# Patient Record
Sex: Male | Born: 1940 | Race: White | Hispanic: No | Marital: Married | Smoking: Former smoker
Health system: Southern US, Community
[De-identification: ages and names within clinical notes are randomized; demographics above are authoritative.]

## PROBLEM LIST (undated history)

## (undated) DIAGNOSIS — M1990 Unspecified osteoarthritis, unspecified site: Secondary | ICD-10-CM

## (undated) DIAGNOSIS — I10 Essential (primary) hypertension: Secondary | ICD-10-CM

## (undated) DIAGNOSIS — E785 Hyperlipidemia, unspecified: Secondary | ICD-10-CM

## (undated) DIAGNOSIS — I5030 Unspecified diastolic (congestive) heart failure: Secondary | ICD-10-CM

## (undated) DIAGNOSIS — I499 Cardiac arrhythmia, unspecified: Secondary | ICD-10-CM

## (undated) DIAGNOSIS — IMO0002 Reserved for concepts with insufficient information to code with codable children: Secondary | ICD-10-CM

## (undated) DIAGNOSIS — I4891 Unspecified atrial fibrillation: Secondary | ICD-10-CM

## (undated) DIAGNOSIS — M329 Systemic lupus erythematosus, unspecified: Secondary | ICD-10-CM

## (undated) DIAGNOSIS — R7302 Impaired glucose tolerance (oral): Secondary | ICD-10-CM

## (undated) DIAGNOSIS — Z952 Presence of prosthetic heart valve: Secondary | ICD-10-CM

## (undated) DIAGNOSIS — Z950 Presence of cardiac pacemaker: Secondary | ICD-10-CM

## (undated) HISTORY — DX: Reserved for concepts with insufficient information to code with codable children: IMO0002

## (undated) HISTORY — DX: Systemic lupus erythematosus, unspecified: M32.9

## (undated) HISTORY — PX: COLONOSCOPY: SHX174

## (undated) HISTORY — PX: REPLACEMENT TOTAL KNEE: SUR1224

## (undated) HISTORY — PX: EYE SURGERY: SHX253

## (undated) HISTORY — PX: VASECTOMY: SHX75

---

## 2004-07-20 ENCOUNTER — Ambulatory Visit: Payer: Self-pay | Admitting: Family Medicine

## 2005-05-25 ENCOUNTER — Ambulatory Visit: Payer: Self-pay | Admitting: Family Medicine

## 2012-03-24 HISTORY — PX: PACEMAKER IMPLANT: EP1218

## 2012-05-24 HISTORY — PX: CARDIAC CATHETERIZATION: SHX172

## 2014-11-19 DIAGNOSIS — E785 Hyperlipidemia, unspecified: Secondary | ICD-10-CM | POA: Insufficient documentation

## 2014-11-19 DIAGNOSIS — Z23 Encounter for immunization: Secondary | ICD-10-CM

## 2014-11-19 DIAGNOSIS — D509 Iron deficiency anemia, unspecified: Secondary | ICD-10-CM | POA: Insufficient documentation

## 2014-11-19 DIAGNOSIS — I480 Paroxysmal atrial fibrillation: Secondary | ICD-10-CM

## 2014-11-19 DIAGNOSIS — Z950 Presence of cardiac pacemaker: Secondary | ICD-10-CM | POA: Insufficient documentation

## 2014-11-19 DIAGNOSIS — Z45018 Encounter for adjustment and management of other part of cardiac pacemaker: Secondary | ICD-10-CM | POA: Insufficient documentation

## 2014-11-19 HISTORY — DX: Encounter for immunization: Z23

## 2014-11-19 HISTORY — DX: Paroxysmal atrial fibrillation: I48.0

## 2014-11-19 HISTORY — DX: Presence of cardiac pacemaker: Z95.0

## 2014-11-19 HISTORY — DX: Hyperlipidemia, unspecified: E78.5

## 2015-08-27 DIAGNOSIS — E119 Type 2 diabetes mellitus without complications: Secondary | ICD-10-CM

## 2015-08-27 DIAGNOSIS — I340 Nonrheumatic mitral (valve) insufficiency: Secondary | ICD-10-CM | POA: Insufficient documentation

## 2015-08-27 DIAGNOSIS — I2510 Atherosclerotic heart disease of native coronary artery without angina pectoris: Secondary | ICD-10-CM | POA: Insufficient documentation

## 2015-08-27 DIAGNOSIS — Z8739 Personal history of other diseases of the musculoskeletal system and connective tissue: Secondary | ICD-10-CM

## 2015-08-27 HISTORY — DX: Personal history of other diseases of the musculoskeletal system and connective tissue: Z87.39

## 2015-08-27 HISTORY — DX: Type 2 diabetes mellitus without complications: E11.9

## 2016-02-26 DIAGNOSIS — H903 Sensorineural hearing loss, bilateral: Secondary | ICD-10-CM | POA: Insufficient documentation

## 2016-02-26 DIAGNOSIS — Z8679 Personal history of other diseases of the circulatory system: Secondary | ICD-10-CM | POA: Insufficient documentation

## 2016-02-26 DIAGNOSIS — H9313 Tinnitus, bilateral: Secondary | ICD-10-CM | POA: Insufficient documentation

## 2016-02-26 HISTORY — DX: Personal history of other diseases of the circulatory system: Z86.79

## 2016-07-29 DIAGNOSIS — I442 Atrioventricular block, complete: Secondary | ICD-10-CM | POA: Insufficient documentation

## 2017-03-11 ENCOUNTER — Telehealth: Payer: Self-pay | Admitting: Cardiology

## 2017-03-11 NOTE — Telephone Encounter (Signed)
Did not need this encounter °

## 2017-05-13 DIAGNOSIS — E785 Hyperlipidemia, unspecified: Secondary | ICD-10-CM

## 2017-05-13 DIAGNOSIS — R6510 Systemic inflammatory response syndrome (SIRS) of non-infectious origin without acute organ dysfunction: Secondary | ICD-10-CM | POA: Diagnosis not present

## 2017-05-13 DIAGNOSIS — I10 Essential (primary) hypertension: Secondary | ICD-10-CM | POA: Diagnosis not present

## 2017-05-13 DIAGNOSIS — I4891 Unspecified atrial fibrillation: Secondary | ICD-10-CM | POA: Diagnosis not present

## 2017-05-13 DIAGNOSIS — R748 Abnormal levels of other serum enzymes: Secondary | ICD-10-CM | POA: Diagnosis not present

## 2017-05-13 DIAGNOSIS — I2510 Atherosclerotic heart disease of native coronary artery without angina pectoris: Secondary | ICD-10-CM | POA: Diagnosis not present

## 2017-05-14 ENCOUNTER — Inpatient Hospital Stay (HOSPITAL_COMMUNITY)
Admission: AD | Admit: 2017-05-14 | Discharge: 2017-05-17 | DRG: 871 | Disposition: A | Discharge status: Home / Self Care | Payer: Medicare Other | Source: Other Acute Inpatient Hospital | Attending: Internal Medicine | Admitting: Internal Medicine

## 2017-05-14 ENCOUNTER — Encounter (HOSPITAL_COMMUNITY): Payer: Self-pay | Admitting: Internal Medicine

## 2017-05-14 ENCOUNTER — Other Ambulatory Visit: Payer: Self-pay

## 2017-05-14 DIAGNOSIS — R042 Hemoptysis: Secondary | ICD-10-CM | POA: Diagnosis present

## 2017-05-14 DIAGNOSIS — I10 Essential (primary) hypertension: Secondary | ICD-10-CM | POA: Diagnosis not present

## 2017-05-14 DIAGNOSIS — Z800 Family history of malignant neoplasm of digestive organs: Secondary | ICD-10-CM

## 2017-05-14 DIAGNOSIS — R040 Epistaxis: Secondary | ICD-10-CM | POA: Diagnosis present

## 2017-05-14 DIAGNOSIS — T45515A Adverse effect of anticoagulants, initial encounter: Secondary | ICD-10-CM | POA: Diagnosis present

## 2017-05-14 DIAGNOSIS — Z96652 Presence of left artificial knee joint: Secondary | ICD-10-CM | POA: Diagnosis present

## 2017-05-14 DIAGNOSIS — I480 Paroxysmal atrial fibrillation: Secondary | ICD-10-CM | POA: Diagnosis not present

## 2017-05-14 DIAGNOSIS — R7302 Impaired glucose tolerance (oral): Secondary | ICD-10-CM | POA: Diagnosis present

## 2017-05-14 DIAGNOSIS — A419 Sepsis, unspecified organism: Secondary | ICD-10-CM | POA: Diagnosis not present

## 2017-05-14 DIAGNOSIS — D6832 Hemorrhagic disorder due to extrinsic circulating anticoagulants: Secondary | ICD-10-CM | POA: Diagnosis present

## 2017-05-14 DIAGNOSIS — Z7901 Long term (current) use of anticoagulants: Secondary | ICD-10-CM | POA: Diagnosis not present

## 2017-05-14 DIAGNOSIS — E785 Hyperlipidemia, unspecified: Secondary | ICD-10-CM | POA: Diagnosis present

## 2017-05-14 DIAGNOSIS — Z888 Allergy status to other drugs, medicaments and biological substances status: Secondary | ICD-10-CM | POA: Diagnosis not present

## 2017-05-14 DIAGNOSIS — R55 Syncope and collapse: Secondary | ICD-10-CM | POA: Diagnosis not present

## 2017-05-14 DIAGNOSIS — I248 Other forms of acute ischemic heart disease: Secondary | ICD-10-CM | POA: Diagnosis present

## 2017-05-14 DIAGNOSIS — Z87891 Personal history of nicotine dependence: Secondary | ICD-10-CM

## 2017-05-14 DIAGNOSIS — Z950 Presence of cardiac pacemaker: Secondary | ICD-10-CM

## 2017-05-14 DIAGNOSIS — N401 Enlarged prostate with lower urinary tract symptoms: Secondary | ICD-10-CM | POA: Diagnosis not present

## 2017-05-14 DIAGNOSIS — D696 Thrombocytopenia, unspecified: Secondary | ICD-10-CM | POA: Diagnosis present

## 2017-05-14 DIAGNOSIS — I482 Chronic atrial fibrillation: Secondary | ICD-10-CM | POA: Diagnosis not present

## 2017-05-14 DIAGNOSIS — I110 Hypertensive heart disease with heart failure: Secondary | ICD-10-CM | POA: Diagnosis present

## 2017-05-14 DIAGNOSIS — I2510 Atherosclerotic heart disease of native coronary artery without angina pectoris: Secondary | ICD-10-CM | POA: Diagnosis not present

## 2017-05-14 DIAGNOSIS — E43 Unspecified severe protein-calorie malnutrition: Secondary | ICD-10-CM | POA: Diagnosis not present

## 2017-05-14 DIAGNOSIS — Z6834 Body mass index (BMI) 34.0-34.9, adult: Secondary | ICD-10-CM

## 2017-05-14 DIAGNOSIS — R7989 Other specified abnormal findings of blood chemistry: Secondary | ICD-10-CM

## 2017-05-14 DIAGNOSIS — I350 Nonrheumatic aortic (valve) stenosis: Secondary | ICD-10-CM | POA: Diagnosis present

## 2017-05-14 DIAGNOSIS — R778 Other specified abnormalities of plasma proteins: Secondary | ICD-10-CM | POA: Diagnosis present

## 2017-05-14 DIAGNOSIS — Y92009 Unspecified place in unspecified non-institutional (private) residence as the place of occurrence of the external cause: Secondary | ICD-10-CM

## 2017-05-14 DIAGNOSIS — Z8249 Family history of ischemic heart disease and other diseases of the circulatory system: Secondary | ICD-10-CM | POA: Diagnosis not present

## 2017-05-14 DIAGNOSIS — R338 Other retention of urine: Secondary | ICD-10-CM | POA: Diagnosis not present

## 2017-05-14 DIAGNOSIS — R748 Abnormal levels of other serum enzymes: Secondary | ICD-10-CM | POA: Diagnosis not present

## 2017-05-14 DIAGNOSIS — I5032 Chronic diastolic (congestive) heart failure: Secondary | ICD-10-CM | POA: Diagnosis present

## 2017-05-14 DIAGNOSIS — I4891 Unspecified atrial fibrillation: Secondary | ICD-10-CM | POA: Diagnosis not present

## 2017-05-14 DIAGNOSIS — R6510 Systemic inflammatory response syndrome (SIRS) of non-infectious origin without acute organ dysfunction: Secondary | ICD-10-CM | POA: Diagnosis not present

## 2017-05-14 HISTORY — DX: Impaired glucose tolerance (oral): R73.02

## 2017-05-14 HISTORY — DX: Hyperlipidemia, unspecified: E78.5

## 2017-05-14 HISTORY — DX: Presence of cardiac pacemaker: Z95.0

## 2017-05-14 HISTORY — DX: Sepsis, unspecified organism: A41.9

## 2017-05-14 HISTORY — DX: Essential (primary) hypertension: I10

## 2017-05-14 HISTORY — DX: Unspecified diastolic (congestive) heart failure: I50.30

## 2017-05-14 HISTORY — DX: Unspecified atrial fibrillation: I48.91

## 2017-05-14 LAB — COMPREHENSIVE METABOLIC PANEL
ALT: 22 U/L (ref 17–63)
ANION GAP: 8 (ref 5–15)
AST: 33 U/L (ref 15–41)
Albumin: 2.8 g/dL — ABNORMAL LOW (ref 3.5–5.0)
Alkaline Phosphatase: 48 U/L (ref 38–126)
BILIRUBIN TOTAL: 0.7 mg/dL (ref 0.3–1.2)
BUN: 31 mg/dL — ABNORMAL HIGH (ref 6–20)
CO2: 21 mmol/L — ABNORMAL LOW (ref 22–32)
Calcium: 8.1 mg/dL — ABNORMAL LOW (ref 8.9–10.3)
Chloride: 101 mmol/L (ref 101–111)
Creatinine, Ser: 1.01 mg/dL (ref 0.61–1.24)
Glucose, Bld: 128 mg/dL — ABNORMAL HIGH (ref 65–99)
POTASSIUM: 3.2 mmol/L — AB (ref 3.5–5.1)
Sodium: 130 mmol/L — ABNORMAL LOW (ref 135–145)
TOTAL PROTEIN: 5.6 g/dL — AB (ref 6.5–8.1)

## 2017-05-14 LAB — CBC
HCT: 24.6 % — ABNORMAL LOW (ref 39.0–52.0)
HEMOGLOBIN: 8.7 g/dL — AB (ref 13.0–17.0)
MCH: 32 pg (ref 26.0–34.0)
MCHC: 35.4 g/dL (ref 30.0–36.0)
MCV: 90.4 fL (ref 78.0–100.0)
PLATELETS: 65 10*3/uL — AB (ref 150–400)
RBC: 2.72 MIL/uL — AB (ref 4.22–5.81)
RDW: 13.2 % (ref 11.5–15.5)
WBC: 5.5 10*3/uL (ref 4.0–10.5)

## 2017-05-14 LAB — LACTIC ACID, PLASMA: LACTIC ACID, VENOUS: 1 mmol/L (ref 0.5–1.9)

## 2017-05-14 LAB — PROTIME-INR
INR: 1.66
PROTHROMBIN TIME: 19.5 s — AB (ref 11.4–15.2)

## 2017-05-14 LAB — APTT: APTT: 54 s — AB (ref 24–36)

## 2017-05-14 LAB — TROPONIN I: TROPONIN I: 2.7 ng/mL — AB (ref <0.03)

## 2017-05-14 LAB — PROCALCITONIN: Procalcitonin: 0.69 ng/mL

## 2017-05-14 MED ORDER — SODIUM CHLORIDE 0.9% FLUSH
3.0000 mL | Freq: Two times a day (BID) | INTRAVENOUS | Status: DC
  Administered 2017-05-14 – 2017-05-17 (×3): 3 mL via INTRAVENOUS

## 2017-05-14 MED ORDER — ROSUVASTATIN CALCIUM 20 MG PO TABS
20.0000 mg | ORAL_TABLET | Freq: Every day | ORAL | Status: DC
  Administered 2017-05-14 – 2017-05-16 (×3): 20 mg via ORAL
  Filled 2017-05-14 (×3): qty 1

## 2017-05-14 MED ORDER — ONDANSETRON HCL 4 MG PO TABS: 4.0000 mg | ORAL_TABLET | Freq: Four times a day (QID) | ORAL | Status: DC | PRN

## 2017-05-14 MED ORDER — RIVAROXABAN 20 MG PO TABS: 20.0000 mg | ORAL_TABLET | Freq: Every day | ORAL | Status: DC

## 2017-05-14 MED ORDER — VANCOMYCIN HCL IN DEXTROSE 1-5 GM/200ML-% IV SOLN: 1000.0000 mg | Freq: Once | INTRAVENOUS | Status: DC

## 2017-05-14 MED ORDER — LACTATED RINGERS IV SOLN
INTRAVENOUS | Status: DC
  Administered 2017-05-14: 23:00:00 via INTRAVENOUS

## 2017-05-14 MED ORDER — ACETAMINOPHEN 650 MG RE SUPP: 650.0000 mg | Freq: Four times a day (QID) | RECTAL | Status: DC | PRN

## 2017-05-14 MED ORDER — PIPERACILLIN-TAZOBACTAM 3.375 G IVPB 30 MIN: 3.3750 g | Freq: Once | INTRAVENOUS | Status: DC

## 2017-05-14 MED ORDER — ONDANSETRON HCL 4 MG/2ML IJ SOLN: 4.0000 mg | Freq: Four times a day (QID) | INTRAMUSCULAR | Status: DC | PRN

## 2017-05-14 MED ORDER — ACETAMINOPHEN 325 MG PO TABS: 650.0000 mg | ORAL_TABLET | Freq: Four times a day (QID) | ORAL | Status: DC | PRN

## 2017-05-14 MED ORDER — DOCUSATE SODIUM 100 MG PO CAPS
100.0000 mg | ORAL_CAPSULE | Freq: Two times a day (BID) | ORAL | Status: DC
  Administered 2017-05-14 – 2017-05-15 (×3): 100 mg via ORAL
  Filled 2017-05-14 (×5): qty 1

## 2017-05-14 MED ORDER — METOPROLOL TARTRATE 25 MG PO TABS
25.0000 mg | ORAL_TABLET | Freq: Two times a day (BID) | ORAL | Status: DC
  Administered 2017-05-14 – 2017-05-17 (×3): 25 mg via ORAL
  Filled 2017-05-14 (×5): qty 1

## 2017-05-14 NOTE — H&P (Signed)
History and Physical    Frederick Vasquez:[id number] DOB: 1941/01/10 DOA: 05/14/2017  PCP: Frederick Vasquez Consultants:  ENT - [facility]; Cardiology - Ammie Ferrier Patient coming from:  Home - lives with wife; Frederick Vasquez: Wife, [phone]  Chief Complaint: transfer from [place]  HPI: Frederick Vasquez is a 76 y.o. male with medical history significant of diastolic heart failure; HTN; HLD: afib on Xarelto; pacemaker placement; and impaired glucose tolerance presenting as a transfer from [place].  Initially, he developed epistaxis.  He had it packed at ENT on Thursday AM.  He went back Friday to have it repacked after it got dislodged.  He was sleeping a lot and had developed a fever.  T103 at that time.  They went to Urgent Care and he was hypotensive and sent to the ER at [facility].  He was kept overnight and had a bunch of tests including an EKG and Echo.  Blood cultures are still pending; they gave him broad spectrum antibiotics.  Fever is still intermittently present, last fever was at [place] today and was 102.  He feels "yucky."  Minimal cough, no congestion.  No urinary symptoms but "they said I had a urinary infection."  No GI symptoms.  No rash.    Review of Systems: As per HPI; otherwise review of systems reviewed and negative.   Ambulatory Status:  Ambulates without assistance  Past Medical History:  Diagnosis Date  . Atrial fibrillation ([place])    Xarelto  . Diastolic heart failure ([place])   . Hyperlipidemia   . Hypertension   . Impaired glucose tolerance   . Pacemaker     Past Surgical History:  Procedure Laterality Date  . PACEMAKER IMPLANT    . REPLACEMENT TOTAL KNEE Left   . VASECTOMY      Social History   Socioeconomic History  . Marital status: Married    Spouse name: Not on file  . Number of children: Not on file  . Years of education: Not on file  . Highest education level: Not on file  Social Needs  . Financial resource strain: Not on file  . Food insecurity  - worry: Not on file  . Food insecurity - inability: Not on file  . Transportation needs - medical: Not on file  . Transportation needs - non-medical: Not on file  Occupational History  . Occupation: retired  Tobacco Use  . Smoking status: Former Smoker    Last attempt to quit: 1978    Years since quitting: 41.0  . Smokeless tobacco: Never Used  Substance and Sexual Activity  . Alcohol use: No    Frequency: Never  . Drug use: No  . Sexual activity: Not on file  Other Topics Concern  . Not on file  Social History Narrative  . Not on file    No Known Allergies  History reviewed. No pertinent family history.  Prior to Admission medications   Not on File    Physical Exam: Vitals:   05/14/17 1930 05/14/17 2235  BP: (!) 106/45 (!) 100/51  Pulse: 88 78  Resp: (!) 24 20  Temp: 98.9 F (37.2 C) 99.2 F (37.3 C)  TempSrc: Oral Oral  SpO2: 99% 98%  Weight: 109.6 kg (241 lb 9.6 oz)   Height: 5\' 10"  (1.778 m)      General:  Appears calm and comfortable but fatigued and is NAD Eyes:   EOMI, normal lids, iris ENT:  grossly normal hearing, lips & tongue, mmm Neck:  no  LAD, masses or thyromegaly; no carotid bruits Cardiovascular:  Irregularly irregular, 1-2/8 systolic murmur, no r/g. No LE edema.  Respiratory:   CTA bilaterally with no wheezes/rales/rhonchi.  Normal respiratory effort. Abdomen:  soft, NT, ND, NABS Skin:  no rash or induration seen on limited exam Musculoskeletal:  grossly normal tone BUE/BLE, good ROM, no bony abnormality Vasquez extremity:  No LE edema.  Limited foot exam with no ulcerations.  2+ distal pulses. Psychiatric:  grossly normal mood and affect, speech fluent and appropriate, AOx3 Neurologic:  CN 2-12 grossly intact, moves all extremities in coordinated fashion, sensation intact   Radiological Exams on Admission: No results found.   EKG:  pending  Labs on Admission: I have personally reviewed the available labs and imaging studies at the  time of the admission.  Pertinent labs at OSH:   Influenza negative Echo 12/22: preserved EF, no WMA, left AE, pacemaker in place, mild MS and MR; moderate AS (progressed from 25 to 32 mm), mild AR  CBC 12/22 - WBC 6.4, Hgb 9.3, Plt 74 BMP 12/22 - 135/3.3/106/22/43/1.20/135 Troponin 2.77 at 0027, 2.99 at 0516, 3.47 at 0850 A1c 5.3 INR 1.5 Lactate 2.0 UA: 1+ protein, LE/nitrite negative, 5-10 WBC and RBC  Labs at [facility]: Na++ 130 K+ 3.2 CO2 21 Glucose 128 BUN 31/Creatinine 1.01/GFR >60 Albumin 2.8 Troponin 2.70 Lactate 1.0 Procalcitonin 0.69 WBC 5.5 Hgb 8.7 Platelets 65 INR 1.66  Assessment/Plan Active Problems:   Sepsis (HCC)   Epistaxis   Aortic stenosis   Elevated troponin   Atrial fibrillation, chronic (HCC)   Chronic diastolic CHF (congestive heart failure) (HCC)   Essential hypertension   Hyperlipidemia   Impaired glucose tolerance   Thrombocytopenia (HCC)   Severe malnutrition (HCC)   Sepsis -Fever, tachycardia with normal lactate and borderline hypotension on presentation -While awaiting blood cultures, this appears to be a preseptic condition. -Sepsis protocol initiated;sepsis physiology appears to have resolved with broad spectrum antibiotics and fluid resuscitation. -Normal lactate on admission and now -Procalcitonin remains elevated.  Antibiotics would not be indicated for PCT <0.1 and probably should not be used for < 0.25.  >0.5 indicates infection and >>0.5 indicates more serious disease.  As the procalcitonin level normalizes, it will be reasonable to consider de-escalation of antibiotic coverage. -Blood and urine cultures pending -Will admit with telemetry and continue to monitor -Treat with IV Vanc/Zosyn for now for undifferentiated sepsis.   Elevated troponin with moderate AS -Thought to be due to demand ischemia in the setting of sepsis -Troponin appears to have peaked and is now downtrending (assuming lab compatibility) -Will recheck q6h x 2  more to ensure ongoing downtrend -Cardiology was following while he was at [facility] and Echo showed progression of his AS - but this appears to still be moderate and patient denies symptoms of exertional SOB or CP -He was sent here for a TEE (?looking for endocarditis as source of sepsis - although with negative blood cultures it is not clear that this would be necessary) and further cardiac evaluation in the setting of elevated troponin -Cardiology will need to be consulted in the AM  Epistaxis -This was patient's initial complaint  -Likely related to anticoagulation and thrombocytopenia -Nasal packing is in place -Recommendation from [facility] Linsey was for ENT consultation while at [facility]  Afib on Xarelto -Rate controlled -Tonight's dose held in the setting of thrombocytopenia and nosebleed, as it is not clear whether he has been receiving this daily and whether he already received today's dose  Thrombocytopenia -Patient  with prior thrombocytopenia dating back to 2016, but it is much more serious currently -Could be related to Xarelto, as this is a known serious side effect of the medication -Will follow for now with repeat CBC in AM  Chronic diastolic heart failure -Not obviously specified on 12/22 Echo -Clinically compensated -Will follow  HTN -Hold Cozaar due to AKI vs. CKD at [facility] -Current labs indicate improvement and normalization of renal function, indicating that this was likely AKI -Continue Lopressor  HLD -Continue Cozaar  Impaired glucose tolerance -A1c 5.3 at [place] so will not evaluate/treat further at this time  Severe malnutrition -Will request nutrition consult  DVT prophylaxis: Xarelto Code Status:  Full - confirmed with patient/family Family Communication: Wife present throughout evaluation  Disposition Plan:  Home once clinically improved Consults called: Nutrition; needs cardiology tomorrow - CardsMaster message sent Admission status: Admit - It is my  clinical opinion that admission to INPATIENT is reasonable and necessary because this patient will require at least 2 midnights in the hospital to treat this condition based on the medical complexity of the problems presented.  Given the aforementioned information, the predictability of an adverse outcome is felt to be significant.    Frederick Bongo MD [facility]  If note is complete, please contact covering daytime or nighttime physician. [email or web address] Password [email or web address]  05/15/2017, 1:12 AM

## 2017-05-14 NOTE — Treatment Plan (Addendum)
76 year old male presented past medical history significant for atrial fibrillation on anticoagulation and hypertension who presented to [facility] last night due to nose bleeding.  He had presented to his ENT as an outpatient.  Was seen by ENT and packed; within 24 hours of that packing it had dislodged.  He presented to the emergency department [facility] with a fever of 103.  He was hypotensive and tachycardic on admission.  UA, chest x-ray EKG were all okay.  He was started on empiric antibiotics and IV fluids.  Since that time he is no longer hypotensive.  His troponin has trended up to 3.4.  There is concern about demand ischemia.  He was seen by Dr. Baxter Hire from cardiology who performed an echocardiogram today.  Showed evidence of his pacemaker, worsening aortic stenosis and new aortic regurgitation.  She feels patient needs a transesophageal echocardiogram and spoke to Dr. Alvester Chou.  He has clinically improved he has been ambulating in his room and he feels better overall his nose remains packed.  Given his anticoagulation packing should continue for the foreseeable future.  He needs a cardiac workup and is being transferred here for transesophageal echocardiography and possible cardiac catheterization.  Will be admitted to a telemetry bed.

## 2017-05-15 ENCOUNTER — Encounter (HOSPITAL_COMMUNITY): Payer: Self-pay | Admitting: Physician Assistant

## 2017-05-15 DIAGNOSIS — R748 Abnormal levels of other serum enzymes: Secondary | ICD-10-CM

## 2017-05-15 DIAGNOSIS — R040 Epistaxis: Secondary | ICD-10-CM | POA: Diagnosis present

## 2017-05-15 DIAGNOSIS — I5032 Chronic diastolic (congestive) heart failure: Secondary | ICD-10-CM | POA: Diagnosis present

## 2017-05-15 DIAGNOSIS — D696 Thrombocytopenia, unspecified: Secondary | ICD-10-CM | POA: Diagnosis present

## 2017-05-15 DIAGNOSIS — I482 Chronic atrial fibrillation: Secondary | ICD-10-CM

## 2017-05-15 DIAGNOSIS — A419 Sepsis, unspecified organism: Principal | ICD-10-CM

## 2017-05-15 DIAGNOSIS — I350 Nonrheumatic aortic (valve) stenosis: Secondary | ICD-10-CM

## 2017-05-15 DIAGNOSIS — R7989 Other specified abnormal findings of blood chemistry: Secondary | ICD-10-CM | POA: Diagnosis present

## 2017-05-15 DIAGNOSIS — R042 Hemoptysis: Secondary | ICD-10-CM | POA: Diagnosis present

## 2017-05-15 DIAGNOSIS — R7302 Impaired glucose tolerance (oral): Secondary | ICD-10-CM | POA: Diagnosis present

## 2017-05-15 DIAGNOSIS — E785 Hyperlipidemia, unspecified: Secondary | ICD-10-CM

## 2017-05-15 DIAGNOSIS — R778 Other specified abnormalities of plasma proteins: Secondary | ICD-10-CM

## 2017-05-15 DIAGNOSIS — E43 Unspecified severe protein-calorie malnutrition: Secondary | ICD-10-CM

## 2017-05-15 DIAGNOSIS — I10 Essential (primary) hypertension: Secondary | ICD-10-CM | POA: Diagnosis present

## 2017-05-15 DIAGNOSIS — I480 Paroxysmal atrial fibrillation: Secondary | ICD-10-CM | POA: Diagnosis present

## 2017-05-15 HISTORY — DX: Other specified abnormal findings of blood chemistry: R79.89

## 2017-05-15 HISTORY — DX: Essential (primary) hypertension: I10

## 2017-05-15 HISTORY — DX: Hemoptysis: R04.2

## 2017-05-15 HISTORY — DX: Unspecified severe protein-calorie malnutrition: E43

## 2017-05-15 HISTORY — DX: Nonrheumatic aortic (valve) stenosis: I35.0

## 2017-05-15 HISTORY — DX: Thrombocytopenia, unspecified: D69.6

## 2017-05-15 HISTORY — DX: Other specified abnormalities of plasma proteins: R77.8

## 2017-05-15 HISTORY — DX: Chronic diastolic (congestive) heart failure: I50.32

## 2017-05-15 LAB — URINALYSIS, ROUTINE W REFLEX MICROSCOPIC
Bacteria, UA: NONE SEEN
Bilirubin Urine: NEGATIVE
Glucose, UA: 50 mg/dL — AB
Ketones, ur: NEGATIVE mg/dL
Leukocytes, UA: NEGATIVE
Nitrite: NEGATIVE
Protein, ur: NEGATIVE mg/dL
Specific Gravity, Urine: 1.018 (ref 1.005–1.030)
pH: 5 (ref 5.0–8.0)

## 2017-05-15 LAB — BASIC METABOLIC PANEL
ANION GAP: 5 (ref 5–15)
BUN: 26 mg/dL — ABNORMAL HIGH (ref 6–20)
CHLORIDE: 104 mmol/L (ref 101–111)
CO2: 21 mmol/L — ABNORMAL LOW (ref 22–32)
Calcium: 8 mg/dL — ABNORMAL LOW (ref 8.9–10.3)
Creatinine, Ser: 0.92 mg/dL (ref 0.61–1.24)
GFR calc Af Amer: >60 mL/min (ref >60)
GLUCOSE: 133 mg/dL — AB (ref 65–99)
POTASSIUM: 3.2 mmol/L — AB (ref 3.5–5.1)
Sodium: 130 mmol/L — ABNORMAL LOW (ref 135–145)

## 2017-05-15 LAB — CBC
HEMATOCRIT: 23.5 % — AB (ref 39.0–52.0)
Hemoglobin: 8.3 g/dL — ABNORMAL LOW (ref 13.0–17.0)
MCH: 32 pg (ref 26.0–34.0)
MCHC: 35.3 g/dL (ref 30.0–36.0)
MCV: 90.7 fL (ref 78.0–100.0)
Platelets: 61 10*3/uL — ABNORMAL LOW (ref 150–400)
RBC: 2.59 MIL/uL — ABNORMAL LOW (ref 4.22–5.81)
RDW: 13.3 % (ref 11.5–15.5)
WBC: 4.1 10*3/uL (ref 4.0–10.5)

## 2017-05-15 LAB — TROPONIN I
TROPONIN I: 1.34 ng/mL — AB (ref <0.03)
Troponin I: 2.03 ng/mL (ref <0.03)

## 2017-05-15 LAB — OCCULT BLOOD X 1 CARD TO LAB, STOOL: Fecal Occult Bld: POSITIVE — AB

## 2017-05-15 MED ORDER — POTASSIUM CHLORIDE CRYS ER 20 MEQ PO TBCR
40.0000 meq | EXTENDED_RELEASE_TABLET | Freq: Once | ORAL | Status: AC
  Administered 2017-05-15: 40 meq via ORAL
  Filled 2017-05-15: qty 2

## 2017-05-15 MED ORDER — VANCOMYCIN HCL IN DEXTROSE 1-5 GM/200ML-% IV SOLN
1000.0000 mg | Freq: Two times a day (BID) | INTRAVENOUS | Status: DC
  Administered 2017-05-15 – 2017-05-16 (×3): 1000 mg via INTRAVENOUS
  Filled 2017-05-15 (×5): qty 200

## 2017-05-15 MED ORDER — BACID PO TABS
2.0000 | ORAL_TABLET | Freq: Once | ORAL | Status: AC
Start: 2017-05-15 — End: 2017-05-15
  Administered 2017-05-15: 2 via ORAL
  Filled 2017-05-15: qty 2

## 2017-05-15 MED ORDER — PIPERACILLIN-TAZOBACTAM 3.375 G IVPB
3.3750 g | Freq: Three times a day (TID) | INTRAVENOUS | Status: DC
  Administered 2017-05-15 – 2017-05-17 (×8): 3.375 g via INTRAVENOUS
  Filled 2017-05-15 (×10): qty 50

## 2017-05-15 MED ORDER — METOPROLOL TARTRATE 12.5 MG HALF TABLET
12.5000 mg | ORAL_TABLET | Freq: Once | ORAL | Status: AC
  Administered 2017-05-15: 12.5 mg via ORAL
  Filled 2017-05-15: qty 1

## 2017-05-15 NOTE — Progress Notes (Signed)
CRITICAL VALUE ALERT  Critical Value:  Troponin 2.70  Date & Time Notied:  00:05 05/15/17  Provider Notified: Dr. Lorin Mercy  Orders Received/Actions taken: No new orders received.

## 2017-05-15 NOTE — Progress Notes (Signed)
PROGRESS NOTE  Frederick Vasquez ZDG:[id number] DOB: [birth year] DOA: 05/14/2017 PCP: System, Pcp Not In  HPI/Recap of past 24 hours: HPI from Karmen Bongo, MD on 05/14/17 Frederick Vasquez is a 76 y.o. male with medical history significant of diastolic heart failure; HTN; HLD, afib on Xarelto; pacemaker placement; and impaired glucose tolerance presenting as a transfer from [place].  Initially, he developed epistaxis.  He had it packed at ENT on Thursday AM.  He went back Friday to have it repacked after it got dislodged. Patient was subsequently noted to be febrile T103 at that time. Pt went to Urgent Care, was noted to be hypotensive and tachycardic, sent to the ER at [facility]. UA, chest x-ray EKG were all okay. Pt was started on empiric antibiotics and IV fluids, with resolved hypotension. Pt was noted to have elevated troponin 3.4.  There is concern about demand ischemia. Pt transferred to [facility] for further cardiac workup and management  Today, pt noted to be feeling better, remained afebrile for 24H, denies any chest pain, worsening SOB, abdominal pain, N/V/D, fever/chills, Complained of constipation, noted to have moved his bowel today, reports black stool.   Assessment/Plan: Active Problems:   Sepsis ([place])   Epistaxis   Aortic stenosis   Elevated troponin   Atrial fibrillation, chronic (HCC)   Chronic diastolic CHF (congestive heart failure) (HCC)   Essential hypertension   Hyperlipidemia   Impaired glucose tolerance   Thrombocytopenia (HCC)   Severe malnutrition (HCC)  #Sepsis Resolving -Fever, tachycardia with normal lactate and borderline hypotension on presentation -Currently afebrile, no leukocytosis -Procalcitonin 0.69 -Blood and urine cultures pending -On telemetry and continue to monitor -Continue IV Vanc/Zosyn for now, plan to de-escalate   #Elevated troponin with moderate AS Currently chest pain free -??demand ischemia in the setting of sepsis -Troponin peaked at  2.7-->1.34 -Echo done at [facility] on 12/22 showed LVEF 55%, pacemaker in place, worsening AS,   with new mitral regurgitation, no vegetations noted -Dr Baxter Hire at [facility] was concerned on the possibility of endocarditis and rec TEE and    also possible cardic cath -Cardiology on board, will do TEE if [facility] positive or persistent fever. No other recs for now  #Epistaxis -Likely related to anticoagulation and thrombocytopenia -Nasal packing is in place X 1 wk -ENT consultation  #Afib on Xarelto -Rate controlled, SR w/ V pacing CHA2DS2VASc= 5 Continue lopressor, hold xerolto due to epistaxis, recent ??black stool and thrombocytopenia  #Thrombocytopenia Worsening -Patient with prior thrombocytopenia dating back to 2016 -Could be related to Xarelto, as this is a known serious side effect of the medication -Will follow for now with repeat CBC in AM  #Chronic diastolic heart failure -Not obviously specified on 12/22 Echo -Clinically compensated -Will follow  #HTN Stable -Hold Cozaar due to AKI vs. CKD at [facility] -Current labs indicate improvement and normalization of renal function, indicating that this was likely AKI -Continue Lopressor  #HLD -Continue crestor   Code Status: Full   Family Communication: Wife, children at bedside, all questions answered  Disposition Plan: Home once stable   Consultants:  Cardiology  Procedures:  None  Antimicrobials:  IV Zosyn  IV Vanc  DVT prophylaxis:  SCDs for now, No AC as mentioned above   Objective: Vitals:   05/14/17 2235 05/15/17 0601 05/15/17 0912 05/15/17 1408  BP: (!) 100/51 (!) 112/53 (!) 103/47 (!) 106/51  Pulse: 78 79 72 72  Resp: 20 20  18   Temp: 99.2 F (37.3 C) 98.9 F (37.2  C)  97.9 F (36.6 C)  TempSrc: Oral Oral  Oral  SpO2: 98% 98%  99%  Weight:      Height:        Intake/Output Summary (Last 24 hours) at 05/15/2017 1751 Last data filed at 05/15/2017 1407 Gross per 24 hour  Intake  1115.67 ml  Output 650 ml  Net 465.67 ml   Filed Weights   05/14/17 1930  Weight: 109.6 kg (241 lb 9.6 oz)    Exam:   General: Alert, awake, oriented x3, no distress noted  Cardiovascular: S1-S2 present, 2-3/6 murmur noted  Respiratory: Decreased bibasilar breath sounds  Abdomen: Soft, nontender, nondistended, bowel sounds present  Musculoskeletal: No pedal edema noted  Skin: Normal  Psychiatry: Normal mood   Data Reviewed: CBC: Recent Labs  Lab 05/14/17 2256 05/15/17 0508  WBC 5.5 4.1  HGB 8.7* 8.3*  HCT 24.6* 23.5*  MCV 90.4 90.7  PLT 65* 61*   Basic Metabolic Panel: Recent Labs  Lab 05/14/17 2256 05/15/17 0508  NA 130* 130*  K 3.2* 3.2*  CL 101 104  CO2 21* 21*  GLUCOSE 128* 133*  BUN 31* 26*  CREATININE 1.01 0.92  CALCIUM 8.1* 8.0*   GFR: Estimated Creatinine Clearance: 86 mL/min (by C-G formula based on SCr of 0.92 mg/dL). Liver Function Tests: Recent Labs  Lab 05/14/17 2256  AST 33  ALT 22  ALKPHOS 48  BILITOT 0.7  PROT 5.6*  ALBUMIN 2.8*   No results for input(s): LIPASE, AMYLASE in the last 168 hours. No results for input(s): AMMONIA in the last 168 hours. Coagulation Profile: Recent Labs  Lab 05/14/17 2256  INR 1.66   Cardiac Enzymes: Recent Labs  Lab 05/14/17 2256 05/15/17 0508 05/15/17 1015  TROPONINI 2.70* 2.03* 1.34*   BNP (last 3 results) No results for input(s): PROBNP in the last 8760 hours. HbA1C: No results for input(s): HGBA1C in the last 72 hours. CBG: No results for input(s): GLUCAP in the last 168 hours. Lipid Profile: No results for input(s): CHOL, HDL, LDLCALC, TRIG, CHOLHDL, LDLDIRECT in the last 72 hours. Thyroid Function Tests: No results for input(s): TSH, T4TOTAL, FREET4, T3FREE, THYROIDAB in the last 72 hours. Anemia Panel: No results for input(s): VITAMINB12, FOLATE, FERRITIN, TIBC, IRON, RETICCTPCT in the last 72 hours. Urine analysis: No results found for: COLORURINE, APPEARANCEUR, LABSPEC,  PHURINE, GLUCOSEU, HGBUR, BILIRUBINUR, KETONESUR, PROTEINUR, UROBILINOGEN, NITRITE, LEUKOCYTESUR Sepsis Labs: @LABRCNTIP (procalcitonin:4,lacticidven:4)  )No results found for this or any previous visit (from the past 240 hour(s)).    Studies: No results found.  Scheduled Meds: . docusate sodium  100 mg Oral BID  . metoprolol tartrate  25 mg Oral BID  . rosuvastatin  20 mg Oral QHS  . sodium chloride flush  3 mL Intravenous Q12H    Continuous Infusions: . lactated ringers 100 mL/hr at 05/14/17 2310  . piperacillin-tazobactam (ZOSYN)  IV 3.375 g (05/15/17 1357)  . vancomycin Stopped (05/15/17 1358)     LOS: 1 day     Alma Friendly, MD [facility]   If 7PM-7AM, please contact night-coverage [email or web address] Password [facility] 05/15/2017, 5:51 PM

## 2017-05-15 NOTE — Progress Notes (Signed)
Pt and family informed Rn that pt had a BM and the stool was black MD notified

## 2017-05-15 NOTE — Consult Note (Signed)
Cardiology Consultation:   Patient ID: ALMOND FITZGIBBON; [id number]; July 21, 1940   Admit date: 05/14/2017 Date of Consult: 05/15/2017  Primary Care Provider: System, Pcp Not In Primary Cardiologist: Dr Agustin Cree Primary Electrophysiologist:  n/a   Patient Profile:   Frederick Vasquez is a 76 y.o. 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  History of Present Illness:   [name] got a nosebleed earlier this week, saw ENT and it was packed. Friday, he sneezed and blew out the packing>>packing was replaced. No cause of bleeding seen, too much blood so could not see. He had been taking some Aleve because of back pain. He had been sneezing.   He was not feeling well, sleeping a lot. He went to UC and temp was 103 w/ SBP 80s>>send to [facility]. He was treated for sepsis and improved. Started on broad-spectrum ABX but no source of infection found. Blood cx pending. UA ?abnl but CXR ok. Procalcitonin level elevated.   Troponin elevated, but pt w/ no CP pta. Had a little twinge yesterday.   Does not exercise but stays busy without a problem. Pt normally able to vacuum, blow leaves and do other things around the house without having to stop for an hour or so. No hx exertional CP. He does not feel his DOE has changed recently. Was not having problems w/ LE edema or PND. ?chronic orthopnea related to body habitus>>improving.   Does not weigh daily but weighs several times/week. No wt gain. Has been losing weight deliberately, changed his eating habits. Doing well with this, down >50 lbs.    Past Medical History:  Diagnosis Date  . Atrial fibrillation ([place])    Xarelto  . Diastolic heart failure ([place])   . Hyperlipidemia   . Hypertension   . Impaired glucose tolerance   . Pacemaker     Past Surgical History:  Procedure Laterality  Date  . CARDIAC CATHETERIZATION  2014   non-obs dz, done at [facility]  . PACEMAKER IMPLANT  03/2012  . REPLACEMENT TOTAL KNEE Left   . VASECTOMY       Prior to Admission medications   Medication Sig Start Date End Date Taking? Authorizing Provider  amoxicillin-clavulanate (AUGMENTIN) 875-125 MG tablet Take 1 tablet by mouth 2 (two) times daily. 05/12/17  Yes [provider]  losartan (COZAAR) 100 MG tablet Take 100 mg by mouth daily.   Yes [provider]  metoprolol tartrate (LOPRESSOR) 25 MG tablet Take 25 mg by mouth 2 (two) times daily.   Yes [provider]  mupirocin ointment (BACTROBAN) 2 % Place 1 application into the nose 2 (two) times daily as needed for itching. 05/13/17  Yes [provider]  naproxen sodium (ALEVE) 220 MG tablet Take 220 mg by mouth 2 (two) times daily as needed (pain).   Yes [provider]  nitroGLYCERIN (NITROSTAT) 0.4 MG SL tablet Place 0.4 mg under the tongue every 5 (five) minutes as needed for chest pain.   Yes [provider]  rivaroxaban (XARELTO) 20 MG TABS tablet Take 20 mg by mouth daily with supper.   Yes [provider]  rosuvastatin (CRESTOR) 20 MG tablet Take 20 mg by mouth at bedtime.   Yes [provider]    Inpatient Medications: Scheduled Meds: .  docusate sodium  100 mg Oral BID  . metoprolol tartrate  25 mg Oral BID  . potassium chloride  40 mEq Oral Once  . rosuvastatin  20 mg Oral QHS  . sodium chloride flush  3 mL Intravenous Q12H   Continuous Infusions: . lactated ringers 100 mL/hr at 05/14/17 2310  . piperacillin-tazobactam (ZOSYN)  IV Stopped (05/15/17 1106)  . vancomycin Stopped (05/15/17 0309)   PRN Meds: acetaminophen **OR** acetaminophen, ondansetron **OR** ondansetron (ZOFRAN) IV  Allergies:    Allergies  Allergen Reactions  . Allopurinol Rash    uncertain  . Atorvastatin Rash    Social History:   Social History   Socioeconomic History    . Marital status: Married    Spouse name: Not on file  . Number of children: Not on file  . Years of education: Not on file  . Highest education level: Not on file  Social Needs  . Financial resource strain: Not on file  . Food insecurity - worry: Not on file  . Food insecurity - inability: Not on file  . Transportation needs - medical: Not on file  . Transportation needs - non-medical: Not on file  Occupational History  . Occupation: retired  Tobacco Use  . Smoking status: Former Smoker    Last attempt to quit: 1978    Years since quitting: 41.0  . Smokeless tobacco: Never Used  Substance and Sexual Activity  . Alcohol use: No    Frequency: Never  . Drug use: No  . Sexual activity: Not on file  Other Topics Concern  . Not on file  Social History Narrative   Pt lives in [place]    Family History:   Family History  Problem Relation Age of Onset  . CAD Father   . Stroke Father   . Colon cancer Sister    Family Status:  Family Status  Relation Name Status  . Mother  Deceased  . Father  Deceased  . Sister  Deceased    ROS:  Please see the history of present illness.  All other ROS reviewed and negative.     Physical Exam/Data:   Vitals:   05/14/17 1930 05/14/17 2235 05/15/17 0601 05/15/17 0912  BP: (!) 106/45 (!) 100/51 (!) 112/53 (!) 103/47  Pulse: 88 78 79 72  Resp: (!) _0 Temp: 98.9 F (37.2 C) 99.2 F (37.3 C) 98.9 F (37.2 C)   TempSrc: Oral Oral Oral   SpO2: 99% 98% 98%   Weight: 241 lb 9.6 oz (109.6 kg)     Height: _1  (1.778 m)       Intake/Output Summary (Last 24 hours) at 05/15/2017 1132 Last data filed at 05/15/2017 0935 Gross per 24 hour  Intake 879.67 ml  Output 650 ml  Net 229.67 ml   Filed Weights   05/14/17 1930  Weight: 241 lb 9.6 oz (109.6 kg)   Body mass index is 34.67 kg/m.  General:  Well nourished, well developed, in no acute distress HEENT: normal Lymph: no adenopathy Neck: no JVD seen, difficult to  assess 2nd body habitus Endocrine:  No thryomegaly Vascular: No carotid bruits; 4/4 extremity pulses 2+, without bruits  Cardiac:  normal S1, S2; RRR; 2-3/6 murmur  Lungs:  Decreased BS bases bilaterally, no wheezing, rhonchi, few rales R Abd: soft, nontender, no hepatomegaly  Ext: no edema Musculoskeletal:  No deformities, BUE and BLE strength normal and equal Skin: warm and dry  Neuro:  CNs 2-12 intact,  no focal abnormalities noted Psych:  Normal affect   EKG:  The EKG was personally reviewed and demonstrates:  SR, V pacing Telemetry:  Telemetry was personally reviewed and demonstrates:  SR w/ V pacing  Relevant CV Studies:  ECHO: Per Dr Donnetta Hutching note 03/15/2017, AS w/ peak gradient >30 and mean gradient 23 12/22 at [facility] LVH w/ EF 55%, RA/RV chamber size nl, RVSP 32 mm, echodensity c/w implanted electrical device noted, MAC w/ mitral valve sclerosis w/ mild stenosis AS w/ trileaflet valve, mean grad 32 mm, peak gradient 56 mm, mild AI  CATH: 2014 at [facility], per pt, 20-30% lesions, no PCI. No stress test since then  Laboratory Data:  Chemistry Recent Labs  Lab 05/14/17 2256 05/15/17 0508  NA 130* 130*  K 3.2* 3.2*  CL 101 104  CO2 21* 21*  GLUCOSE 128* 133*  BUN 31* 26*  CREATININE 1.01 0.92  CALCIUM 8.1* 8.0*  GFRNONAA >60 >60  GFRAA >60 >60  ANIONGAP 8 5    Total Protein  Date Value Ref Range Status  05/14/2017 5.6 (L) 6.5 - 8.1 g/dL Final   Albumin  Date Value Ref Range Status  05/14/2017 2.8 (L) 3.5 - 5.0 g/dL Final   AST  Date Value Ref Range Status  05/14/2017 33 15 - 41 U/L Final   ALT  Date Value Ref Range Status  05/14/2017 22 17 - 63 U/L Final   Alkaline Phosphatase  Date Value Ref Range Status  05/14/2017 48 38 - 126 U/L Final   Total Bilirubin  Date Value Ref Range Status  05/14/2017 0.7 0.3 - 1.2 mg/dL Final   Hematology Recent Labs  Lab 05/14/17 2256 05/15/17 0508  WBC 5.5 4.1  RBC 2.72* 2.59*  HGB 8.7* 8.3*  HCT 24.6* 23.5*  MCV  90.4 90.7  MCH 32.0 32.0  MCHC 35.4 35.3  RDW 13.2 13.3  PLT 65* 61*   Cardiac Enzymes Recent Labs  Lab 05/14/17 2256 05/15/17 0508  TROPONINI 2.70* 2.03*   No results for input(s): TROPIPOC in the last 168 hours.  BNPNo results for input(s): BNP, PROBNP in the last 168 hours.  DDimer No results for input(s): DDIMER in the last 168 hours. TSH: No results found for: TSH Lipids:No results found for: CHOL, HDL, LDLCALC, LDLDIRECT, TRIG, CHOLHDL HgbA1c:No results found for: HGBA1C  Radiology/Studies:  No results found.  Assessment and Plan:   1. Sepsis - ?cardiac cause - blood cx neg so far, when checked last, IM to follow up on this - pt has PPM and AS, review echo, but no vegetations reported - MD advise on TEE   2.Epistaxis - per IM, pt was told packing to stay in for a week  3.  Aortic stenosis - will get echo report from [facility]  4.  Elevated troponin - unclear cause - no obvious ischemic sx - on oral anticoag pta and here  5.  Atrial fibrillation, chronic ([place]) - per Dr Darral Dash note 10/23, afib burden is low  6.  Chronic diastolic CHF (congestive heart failure) (HCC) - volume status good by exam - need daily weights, at risk for volume overload w/ IVF  7. Chronic anticoag - compliant w/ Xarelto - CHA2DS2VASc= 5 (HTN, CHF, age x 2, CAD)  Otherwise, per IM   Essential hypertension   Hyperlipidemia   Impaired glucose tolerance   Thrombocytopenia (HCC)   Severe malnutrition ([place])  For questions or updates, please contact [facility] Please consult [email or web address] for contact info under Cardiology/STEMI.  Jonetta Speak, PA-C  05/15/2017 11:32 AM  Cardiology Attending  Patient seen and examined. Agree with above. I have reviewed the findings with the patient and his family. Despite his troponin being elevated, he has no symptoms and notes a h/o cardiac cath a few years ago (?2014) where he had no obstructive disease. ECG is not helpful in the  setting of ventricular pacing. With recurrent fevers, he may need a TEE although blood cultures are sterile. In absence of positive blood cultures would probably only do TEE if his fevers persist. I would not recommend any ischemic evaluation unless his symptoms change. His current chest pressure is not angina. If nose bleeds persist, his xarelto could be held. Probably needs work up of anemia as seems unlikely that an acute nose bleed could drop his H/H to 8.3/23.5.  Mikle Bosworth.D.

## 2017-05-15 NOTE — Progress Notes (Signed)
Pharmacy Antibiotic Note  Frederick Vasquez is a 76 y.o. male admitted on 05/14/2017 with sepsis.  Pharmacy has been consulted for Vancomycin/Zosyn dosing. WBC WNL. Renal function age appropriate. Tx from [place].  Antibiotics received at [facility]: Vancomycin 2000mg  IV x 1 on 12/22 at 0200 Zosyn 3.375g IV q6h, last dose 12/22 at 1400  Plan: Vancomycin 1000 mg IV q12h Zosyn 3.375G IV q8h to be infused over 4 hours Trend WBC, temp, renal function  F/U infectious work-up Drug levels as indicated   Height: 5\' 10"  (177.8 cm) Weight: 241 lb 9.6 oz (109.6 kg) IBW/kg (Calculated) : 73  Temp (24hrs), Avg:99.1 F (37.3 C), Min:98.9 F (37.2 C), Max:99.2 F (37.3 C)  Recent Labs  Lab 05/14/17 2256  WBC 5.5  CREATININE 1.01  LATICACIDVEN 1.0    Estimated Creatinine Clearance: 78.3 mL/min (by C-G formula based on SCr of 1.01 mg/dL).    No Known Allergies   [place], Enke 05/15/2017 12:52 AM

## 2017-05-15 NOTE — Progress Notes (Signed)
RN notified C. Bodenheimer, NP about results of urine and blood cultures received via fax from [facility] and that final urine culture negative and preliminary blood cultures negative.  RN informed NP that patient c/o some pain with urination, U/A ordered and sent.  RN also made NP aware patient and family request probiotic, 1 time dose of probiotic ordered by NP and will be administered as ordered.  RN also informed NP about rash on patient's face and informed NP patient and family states patient gets yeast on face at times, skin otherwise looks good.  RN instructed by NP to clean face well, but was informed NP will not order any cream due to possibly irritating face more.  RN explained that no cream will be ordered for patient's face to patient and family, both voiced understanding, cream applied.  P.J. Linus Mako, RN

## 2017-05-16 ENCOUNTER — Encounter (HOSPITAL_COMMUNITY): Payer: Self-pay | Admitting: Radiology

## 2017-05-16 ENCOUNTER — Inpatient Hospital Stay (HOSPITAL_COMMUNITY): Payer: Medicare Other

## 2017-05-16 LAB — CBC WITH DIFFERENTIAL/PLATELET
BASOS PCT: 0 %
Basophils Absolute: 0 10*3/uL (ref 0.0–0.1)
EOS PCT: 1 %
Eosinophils Absolute: 0.1 10*3/uL (ref 0.0–0.7)
HEMATOCRIT: 24 % — AB (ref 39.0–52.0)
Hemoglobin: 8.3 g/dL — ABNORMAL LOW (ref 13.0–17.0)
Lymphocytes Relative: 18 %
Lymphs Abs: 0.6 10*3/uL — ABNORMAL LOW (ref 0.7–4.0)
MCH: 31.6 pg (ref 26.0–34.0)
MCHC: 34.6 g/dL (ref 30.0–36.0)
MCV: 91.3 fL (ref 78.0–100.0)
MONO ABS: 0.3 10*3/uL (ref 0.1–1.0)
MONOS PCT: 9 %
NEUTROS ABS: 2.5 10*3/uL (ref 1.7–7.7)
Neutrophils Relative %: 72 %
Platelets: 78 10*3/uL — ABNORMAL LOW (ref 150–400)
RBC: 2.63 MIL/uL — ABNORMAL LOW (ref 4.22–5.81)
RDW: 13.3 % (ref 11.5–15.5)
WBC: 3.5 10*3/uL — ABNORMAL LOW (ref 4.0–10.5)

## 2017-05-16 LAB — BASIC METABOLIC PANEL
ANION GAP: 5 (ref 5–15)
BUN: 16 mg/dL (ref 6–20)
CALCIUM: 8.4 mg/dL — AB (ref 8.9–10.3)
CO2: 25 mmol/L (ref 22–32)
Chloride: 106 mmol/L (ref 101–111)
Creatinine, Ser: 0.96 mg/dL (ref 0.61–1.24)
GFR calc non Af Amer: >60 mL/min (ref >60)
GLUCOSE: 141 mg/dL — AB (ref 65–99)
Potassium: 3.7 mmol/L (ref 3.5–5.1)
SODIUM: 136 mmol/L (ref 135–145)

## 2017-05-16 MED ORDER — IOPAMIDOL (ISOVUE-300) INJECTION 61%
INTRAVENOUS | Status: AC
  Administered 2017-05-16: 100 mL
  Filled 2017-05-16: qty 100

## 2017-05-16 MED ORDER — HYDROCORTISONE 1 % EX CREA
TOPICAL_CREAM | Freq: Three times a day (TID) | CUTANEOUS | Status: DC | PRN
  Administered 2017-05-17: 1 via TOPICAL
  Filled 2017-05-16: qty 28

## 2017-05-16 MED ORDER — LORATADINE 10 MG PO TABS
10.0000 mg | ORAL_TABLET | Freq: Every day | ORAL | Status: DC | PRN
  Administered 2017-05-16: 10 mg via ORAL
  Filled 2017-05-16: qty 1

## 2017-05-16 MED ORDER — RIVAROXABAN 20 MG PO TABS
20.0000 mg | ORAL_TABLET | Freq: Every day | ORAL | Status: DC
  Administered 2017-05-16: 20 mg via ORAL
  Filled 2017-05-16: qty 1

## 2017-05-16 MED ORDER — TAMSULOSIN HCL 0.4 MG PO CAPS
0.4000 mg | ORAL_CAPSULE | Freq: Every day | ORAL | Status: DC
  Administered 2017-05-16 (×2): 0.4 mg via ORAL
  Filled 2017-05-16 (×2): qty 1

## 2017-05-16 NOTE — Progress Notes (Addendum)
Progress Note  Patient Name: Frederick Vasquez Date of Encounter: 05/16/2017  Primary Cardiologist:  Agustin Cree, MD  Subjective   No dyspnea or chest pain.  Family is concerned about care and communication.  He specifically denies chills, fever, or palpitations.  Inpatient Medications    Scheduled Meds: . docusate sodium  100 mg Oral BID  . metoprolol tartrate  25 mg Oral BID  . rosuvastatin  20 mg Oral QHS  . sodium chloride flush  3 mL Intravenous Q12H  . tamsulosin  0.4 mg Oral QPC supper   Continuous Infusions: . piperacillin-tazobactam (ZOSYN)  IV Stopped (05/16/17 0836)  . vancomycin Stopped (05/16/17 0153)   PRN Meds: acetaminophen **OR** acetaminophen, ondansetron **OR** ondansetron (ZOFRAN) IV   Vital Signs    Vitals:   05/15/17 1826 05/15/17 2241 05/16/17 0605 05/16/17 0919  BP:  (!) 112/51 (!) 97/51 (!) 105/43  Pulse:  77 66 70  Resp:  20 20   Temp: 98.8 F (37.1 C) 98.3 F (36.8 C) 98.1 F (36.7 C)   TempSrc: Oral Oral Oral   SpO2:  97% 99%   Weight:      Height:        Intake/Output Summary (Last 24 hours) at 05/16/2017 1309 Last data filed at 05/16/2017 0719 Gross per 24 hour  Intake 3140.17 ml  Output 2535 ml  Net 605.17 ml   Filed Weights   05/14/17 1930  Weight: 241 lb 9.6 oz (109.6 kg)    Telemetry    Ventricular pacing- Personally Reviewed  ECG    Atrial tracking with ventricular pacing- Personally Reviewed  Physical Exam  Sitting.  Family members in the room.  GEN: No acute distress.   HEENT: Left nostril packing Neck: No JVD. Cardiac: RRR,rubs, or gallops.  3/6 crescendo decrescendo systolic murmur compatible with known aortic stenosis.  Aortic regurgitation is not heard. Respiratory: Clear to auscultation bilaterally. GI: Soft, nontender, non-distended  MS: No edema; No deformity. Neuro:  Nonfocal  Psych: Normal affect   Labs    Chemistry Recent Labs  Lab 05/14/17 2256 05/15/17 0508 05/16/17 0343  NA 130* 130* 136   K 3.2* 3.2* 3.7  CL 101 104 106  CO2 21* 21* 25  GLUCOSE 128* 133* 141*  BUN 31* 26* 16  CREATININE 1.01 0.92 0.96  CALCIUM 8.1* 8.0* 8.4*  PROT 5.6*  --   --   ALBUMIN 2.8*  --   --   AST 33  --   --   ALT 22  --   --   ALKPHOS 48  --   --   BILITOT 0.7  --   --   GFRNONAA >60 >60 >60  GFRAA >60 >60 >60  ANIONGAP 8 5 5      Hematology Recent Labs  Lab 05/14/17 2256 05/15/17 0508 05/16/17 0343  WBC 5.5 4.1 3.5*  RBC 2.72* 2.59* 2.63*  HGB 8.7* 8.3* 8.3*  HCT 24.6* 23.5* 24.0*  MCV 90.4 90.7 91.3  MCH 32.0 32.0 31.6  MCHC 35.4 35.3 34.6  RDW 13.2 13.3 13.3  PLT 65* 61* 78*    Cardiac Enzymes Recent Labs  Lab 05/14/17 2256 05/15/17 0508 05/15/17 1015  TROPONINI 2.70* 2.03* 1.34*   No results for input(s): TROPIPOC in the last 168 hours.   BNPNo results for input(s): BNP, PROBNP in the last 168 hours.   DDimer No results for input(s): DDIMER in the last 168 hours.   Radiology    No results found.  Cardiac  Studies   Echocardiogram 03/15/2017:  ECHO: (Per Dr Donnetta Hutching note 03/15/2017)  AS w/ peak gradient >30 and mean gradient 23 12/22 at [facility] LVH w/ EF 55%, RA/RV chamber size nl, RVSP 32 mm, echodensity c/w implanted electrical device noted, MAC w/ mitral valve sclerosis w/ mild stenosis AS w/ trileaflet valve, mean grad 32 mm, peak gradient 56 mm, mild AI   Patient Profile     76 y.o. 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    Assessment & Plan    1.  Febrile illness,?  Sepsis -negative for flu.  2.  Epistaxis -no recurrence  3.  Known calcific aortic stenosis, moderately severe, by recent echo from October 2018.  Auscultation is compatible with clinical exam.  4.  Elevated troponin, uncertain significance.  Could be demand related in setting of febrile illness.  Nonobstructive coronary disease by  cath 4 years ago.  We will repeat  EKG.  5.  Chronic Diastolic heart failure -no evidence of volume overload  6.  History of paroxysmal atrial fibrillation  7.  Chronic anticoagulation therapy   Overall clinically stable from cardiac standpoint.  Outstanding issues are many questions by the patient's family, concerns about urine residual, and questions about sepsis workup.  Plan no specific cardiac workup unless continued evidence of infection raises question of endocarditis.  For questions or updates, please contact [place] Please consult [email or web address] for contact info under Cardiology/STEMI.      Signed, Sinclair Grooms, MD  05/16/2017, 1:09 PM

## 2017-05-16 NOTE — Progress Notes (Signed)
PT Cancellation Note  Patient Details Name: RANARD HARTE MRN: [id number] DOB: 07-13-1940   Cancelled Treatment:    Reason Eval/Treat Not Completed: Patient not medically ready pt on bedrest. Will await increase in activity orders prior to PT evaluation.    Marguarite Arbour A Anique Beckley 05/16/2017, 7:47 AM Wray Kearns, PT, DPT [phone]

## 2017-05-16 NOTE — Progress Notes (Signed)
ANTICOAGULATION CONSULT NOTE - Initial Consult  Pharmacy Consult for Xarelto  Indication: atrial fibrillation  Allergies  Allergen Reactions  . Allopurinol Rash    uncertain  . Atorvastatin Rash    Patient Measurements: Height: 5\' 10"  (177.8 cm) Weight: 241 lb 9.6 oz (109.6 kg) IBW/kg (Calculated) : 73 Heparin Dosing Weight:   Vital Signs: Temp: 98.5 F (36.9 C) (12/24 1451) Temp Source: Oral (12/24 1451) BP: 147/60 (12/24 1451) Pulse Rate: 90 (12/24 1451)  Labs: Recent Labs    05/14/17 2256 05/15/17 0508 05/15/17 1015 05/16/17 0343  HGB 8.7* 8.3*  --  8.3*  HCT 24.6* 23.5*  --  24.0*  PLT 65* 61*  --  78*  APTT 54*  --   --   --   LABPROT 19.5*  --   --   --   INR 1.66  --   --   --   CREATININE 1.01 0.92  --  0.96  TROPONINI 2.70* 2.03* 1.34*  --     Estimated Creatinine Clearance: 82.4 mL/min (by C-G formula based on SCr of 0.96 mg/dL).   Medical History: Past Medical History:  Diagnosis Date  . Atrial fibrillation ([place])    Xarelto  . Diastolic heart failure ([place])   . Hyperlipidemia   . Hypertension   . Impaired glucose tolerance   . Pacemaker     Assessment: 75 yom with history of Afib (CHA2DS2VASc 5) on PTA Xarelto coming to [facility] from [place] for sepsis concern and epistaxis x 4 days. Patient's Hgb low at 8.3, plts low at 78, and black stools reported yesterday. Per RN, pt is no longer actively bleeding and nose remains packed. MD wishes to restart Xarelto due to stroke risk with Afib.   Goal of Therapy:  Prevent stroke Monitor platelets by anticoagulation protocol: Yes   Plan:  Xarelto 20 mg po starting tonight Monitor closely for signs and symptoms of bleeding Should epistaxis persist on Xarelto, can consider switching to Eliquis  Leroy Libman, PharmD Pharmacy Resident Pager: [phone]

## 2017-05-16 NOTE — Progress Notes (Signed)
  Dietitian Consult received for Assessment  Wt Readings from Last 15 Encounters:  05/14/17 241 lb 9.6 oz (109.6 kg)   Pt verbalizes intentional wt loss; reports he used to weigh over 300 pounds, lost down to 220 and regained some weight and is now trying to lose weight again.   Body mass index is 34.67 kg/m. Patient meets criteria for obesity unspecified based on current BMI.   Current diet order is Heart Healthy, patient is consuming approximately 70-100% of meals at this time. Labs and medications reviewed.   Reviewed Heart Healthy diet with pt and pt's family. Receptive to education, all questions answered.   No nutrition interventions warranted at this time. If nutrition issues arise, please consult RD.   Kerman Passey MS, RD, [place], [place] [phone] Pager  [phone] Weekend/On-Call Pager

## 2017-05-16 NOTE — Evaluation (Signed)
Occupational Therapy Evaluation and Discharge Patient Details Name: Frederick Vasquez MRN: [id number] DOB: 12-27-1940 Today's Date: 05/16/2017    History of Present Illness 76 y.o. male admitted for sepsis. PMH significant of diastolic HF, HLD, A-fib, pacemaker, and impaired glucose tolerance.    Clinical Impression   Pt is functioning at a supervision for safety level in ADL. Pt at times impulsive and tangential, but wife reports this is not new. Wife will be available to assist as needed at home. No further OT needs.    Follow Up Recommendations  No OT follow up    Equipment Recommendations  None recommended by OT    Recommendations for Other Services       Precautions / Restrictions Precautions Precautions: Fall Restrictions Weight Bearing Restrictions: No      Mobility Bed Mobility               General bed mobility comments: pt in chair  Transfers Overall transfer level: Needs assistance Equipment used: None Transfers: Sit to/from Stand Sit to Stand: Supervision         General transfer comment: from toilet and chair    Balance Overall balance assessment: Needs assistance   Sitting balance-Leahy Scale: Good       Standing balance-Leahy Scale: Fair                             ADL either performed or assessed with clinical judgement   ADL                                         General ADL Comments: Supervision for safety and to manage foley.     Vision Baseline Vision/History: Wears glasses Wears Glasses: Reading only Patient Visual Report: No change from baseline       Perception     Praxis      Pertinent Vitals/Pain Pain Assessment: No/denies pain     Hand Dominance Right   Extremity/Trunk Assessment Upper Extremity Assessment Upper Extremity Assessment: Overall WFL for tasks assessed   Lower Extremity Assessment Lower Extremity Assessment: Defer to PT evaluation   Cervical / Trunk  Assessment Cervical / Trunk Assessment: Normal   Communication Communication Communication: HOH   Cognition Arousal/Alertness: Awake/alert Behavior During Therapy: Impulsive;WFL for tasks assessed/performed Overall Cognitive Status: Within Functional Limits for tasks assessed                                 General Comments: tangential conversation, pt's wife states this is baseline.   General Comments       Exercises     Shoulder Instructions      Home Living Family/patient expects to be discharged to:: Private residence Living Arrangements: Spouse/significant other Available Help at Discharge: Available 24 hours/day Type of Home: House Home Access: Stairs to enter [employer] of Steps: 4 Entrance Stairs-Rails: Right;Left Home Layout: One level     Bathroom Shower/Tub: Teacher, early years/pre: Handicapped height     Home Equipment: None          Prior Functioning/Environment Level of Independence: Independent        Comments: Pr previously independent and able to perform chores. Pt retired and does his own yardwork.         OT Problem  List: Impaired balance (sitting and/or standing)      OT Treatment/Interventions:      OT Goals(Current goals can be found in the care plan section) Acute Rehab OT Goals Patient Stated Goal: to return home  OT Frequency:     Barriers to D/C:            Co-evaluation              AM-PAC PT "6 Clicks" Daily Activity     Outcome Measure Help from another person eating meals?: None Help from another person taking care of personal grooming?: A Little Help from another person toileting, which includes using toliet, bedpan, or urinal?: A Little Help from another person bathing (including washing, rinsing, drying)?: A Little Help from another person to put on and taking off regular upper body clothing?: None Help from another person to put on and taking off regular lower body  clothing?: A Little 6 Click Score: 20   End of Session Equipment Utilized During Treatment: Gait belt  Activity Tolerance: Patient tolerated treatment well Patient left: in chair;with call bell/phone within reach;with family/visitor present  OT Visit Diagnosis: Unsteadiness on feet (R26.81)                Time: 9983-3825 OT Time Calculation (min): 27 min Charges:  OT General Charges $OT Visit: 1 Visit OT Evaluation $OT Eval Low Complexity: 1 Low $OT Eval Moderate Complexity: 1 Mod OT Treatments $Self Care/Home Management : 8-22 mins G-Codes:     14-Jun-2017 Nestor Lewandowsky, OTR/L Pager: [phone] Werner Lean Haze [place] 2017-06-14, 3:49 PM

## 2017-05-16 NOTE — Progress Notes (Signed)
Alger Memos, NP returned page, order received for foley catheter due to acute urinary retention.  RN will insert catheter per order. P.J. Linus Mako, RN

## 2017-05-16 NOTE — Progress Notes (Signed)
PROGRESS NOTE  Frederick Vasquez ACZ:[id number] DOB: 24-Feb-1941 DOA: 05/14/2017 PCP: System, Pcp Not In  HPI/Recap of past 24 hours: HPI from Karmen Bongo, MD on 05/14/17 Frederick Vasquez is a 76 y.o. male with medical history significant of diastolic heart failure; HTN; HLD, afib on Xarelto; pacemaker placement; and impaired glucose tolerance presenting as a transfer from [place].  Initially, he developed epistaxis.  He had it packed at ENT on Thursday AM.  He went back Friday to have it repacked after it got dislodged. Patient was subsequently noted to be febrile T103 at that time. Pt went to Urgent Care, was noted to be hypotensive and tachycardic, sent to the ER at [facility]. UA, chest x-ray EKG were all okay. Pt was started on empiric antibiotics and IV fluids, with resolved hypotension. Pt was noted to have elevated troponin 3.4.  There is concern about demand ischemia. Pt transferred to [facility] for further cardiac workup and management  Overnight, pt noted to be retaining urine, after 2 episodes of in and out cath, with draining of ~1L of urine, a foley was placed. Today, pt noted to be feeling better, has remained afebrile, denies any chest pain, worsening SOB, abdominal pain, N/V/D, fever/chills   Assessment/Plan: Active Problems:   Sepsis ([place])   Epistaxis   Aortic stenosis   Elevated troponin   Atrial fibrillation, chronic (HCC)   Chronic diastolic CHF (congestive heart failure) (HCC)   Essential hypertension   Hyperlipidemia   Impaired glucose tolerance   Thrombocytopenia (HCC)   Severe malnutrition (HCC)  #Sepsis Resolving, unknown etiology -Fever, tachycardia with normal lactate and borderline hypotension on presentation -Currently afebrile, no leukocytosis -Procalcitonin 0.69 -Blood and urine cultures no growth so far -On telemetry and continue to monitor -Continue IV Zosyn for now, stop IV Vanc   #Elevated troponin with moderate AS Currently chest pain free -??demand  ischemia in the setting of sepsis -Troponin peaked at 2.7-->1.34 -Echo done at [place] on 12/22 showed LVEF 55%, pacemaker in place, worsening AS,   with new mitral regurgitation, no vegetations noted -Dr Baxter Hire at [place] was concerned on the possibility of endocarditis and rec TEE and    also possible cardic cath -Cardiology on board, will do TEE if [facility] positive or persistent fever. No other recs for now  #Epistaxis -Likely related to anticoagulation and thrombocytopenia -Nasal packing is in place X 1 wk -ENT follow scheduled as outpt  #Acute urinary retension Likely due to BPH given nocturia, dribbling  Started on flomax Plan to remove foley and see if patient can void CT pelvis  #Afib on Xarelto -Rate controlled, SR w/ V pacing CHA2DS2VASc= 5 Continue lopressor, xerolto  #Thrombocytopenia ongoing -Patient with prior thrombocytopenia dating back to 2016 -Could be related to Xarelto, as this is a known serious side effect of the medication -Will follow for now with repeat CBC in AM  #Chronic diastolic heart failure -Not obviously specified on 12/22 Echo -Clinically compensated -Will follow  #HTN Stable -Hold Cozaar due to AKI vs. CKD at [facility] -Current labs indicate improvement and normalization of renal function, indicating that this was likely AKI -Continue Lopressor  #HLD -Continue crestor   Code Status: Full   Family Communication: Wife, children at bedside, all questions answered  Disposition Plan: Home once stable   Consultants:  Cardiology  Procedures:  None  Antimicrobials:  IV Zosyn  IV Vanc  DVT prophylaxis:  SCDs for now, No AC as mentioned above   Objective: Vitals:   05/15/17 2241  05/16/17 0605 05/16/17 0919 05/16/17 1451  BP: (!) 112/51 (!) 97/51 (!) 105/43 (!) 147/60  Pulse: 77 66 70 90  Resp: 20 20  18   Temp: 98.3 F (36.8 C) 98.1 F (36.7 C)  98.5 F (36.9 C)  TempSrc: Oral Oral  Oral  SpO2: 97% 99%  99%    Weight:      Height:        Intake/Output Summary (Last 24 hours) at 05/16/2017 1845 Last data filed at 05/16/2017 0719 Gross per 24 hour  Intake 2904.17 ml  Output 2535 ml  Net 369.17 ml   Filed Weights   05/14/17 1930  Weight: 109.6 kg (241 lb 9.6 oz)    Exam:   General: Alert, awake, oriented x3, no distress noted  Cardiovascular: S1-S2 present, 2-3/6 murmur noted  Respiratory: Decreased bibasilar breath sounds  Abdomen: Soft, nontender, nondistended, bowel sounds present  Musculoskeletal: No pedal edema noted  Skin: Normal  Psychiatry: Normal mood   Data Reviewed: CBC: Recent Labs  Lab 05/14/17 2256 05/15/17 0508 05/16/17 0343  WBC 5.5 4.1 3.5*  NEUTROABS  --   --  2.5  HGB 8.7* 8.3* 8.3*  HCT 24.6* 23.5* 24.0*  MCV 90.4 90.7 91.3  PLT 65* 61* 78*   Basic Metabolic Panel: Recent Labs  Lab 05/14/17 2256 05/15/17 0508 05/16/17 0343  NA 130* 130* 136  K 3.2* 3.2* 3.7  CL 101 104 106  CO2 21* 21* 25  GLUCOSE 128* 133* 141*  BUN 31* 26* 16  CREATININE 1.01 0.92 0.96  CALCIUM 8.1* 8.0* 8.4*   GFR: Estimated Creatinine Clearance: 82.4 mL/min (by C-G formula based on SCr of 0.96 mg/dL). Liver Function Tests: Recent Labs  Lab 05/14/17 2256  AST 33  ALT 22  ALKPHOS 48  BILITOT 0.7  PROT 5.6*  ALBUMIN 2.8*   No results for input(s): LIPASE, AMYLASE in the last 168 hours. No results for input(s): AMMONIA in the last 168 hours. Coagulation Profile: Recent Labs  Lab 05/14/17 2256  INR 1.66   Cardiac Enzymes: Recent Labs  Lab 05/14/17 2256 05/15/17 0508 05/15/17 1015  TROPONINI 2.70* 2.03* 1.34*   BNP (last 3 results) No results for input(s): PROBNP in the last 8760 hours. HbA1C: No results for input(s): HGBA1C in the last 72 hours. CBG: No results for input(s): GLUCAP in the last 168 hours. Lipid Profile: No results for input(s): CHOL, HDL, LDLCALC, TRIG, CHOLHDL, LDLDIRECT in the last 72 hours. Thyroid Function Tests: No  results for input(s): TSH, T4TOTAL, FREET4, T3FREE, THYROIDAB in the last 72 hours. Anemia Panel: No results for input(s): VITAMINB12, FOLATE, FERRITIN, TIBC, IRON, RETICCTPCT in the last 72 hours. Urine analysis:    Component Value Date/Time   COLORURINE YELLOW 05/15/2017 2057   APPEARANCEUR CLEAR 05/15/2017 2057   LABSPEC 1.018 05/15/2017 2057   PHURINE 5.0 05/15/2017 2057   GLUCOSEU 50 (A) 05/15/2017 2057   HGBUR SMALL (A) 05/15/2017 2057   BILIRUBINUR NEGATIVE 05/15/2017 2057   KETONESUR NEGATIVE 05/15/2017 2057   PROTEINUR NEGATIVE 05/15/2017 2057   NITRITE NEGATIVE 05/15/2017 2057   LEUKOCYTESUR NEGATIVE 05/15/2017 2057   Sepsis Labs: @LABRCNTIP (procalcitonin:4,lacticidven:4)  )No results found for this or any previous visit (from the past 240 hour(s)).    Studies: No results found.  Scheduled Meds: . docusate sodium  100 mg Oral BID  . metoprolol tartrate  25 mg Oral BID  . rivaroxaban  20 mg Oral Q supper  . rosuvastatin  20 mg Oral QHS  . sodium  chloride flush  3 mL Intravenous Q12H  . tamsulosin  0.4 mg Oral QPC supper    Continuous Infusions: . piperacillin-tazobactam (ZOSYN)  IV 3.375 g (05/16/17 1651)  . vancomycin Stopped (05/16/17 0153)     LOS: 2 days     Alma Friendly, MD [facility]   If 7PM-7AM, please contact night-coverage [email or web address] Password [facility] 05/16/2017, 6:45 PM

## 2017-05-16 NOTE — Progress Notes (Signed)
Patient complained of difficulty urinating, only voiding small amounts. Alger Memos, NP notified and order to bladder scan patient and I&O cath if over 300 ml received.  Per bladder scanner, patient has reading of greater than 1000 ml.  I&O catheterization performed per order and 1300 ml clear yellow urine returned.  RN made C. Bodenheimer aware and was instructed to bladder scan again towards morning and report if patient continues to retain urine.  RN will continue to monitor patient and report changes to MD as needed. P.J. Linus Mako, RN

## 2017-05-16 NOTE — Evaluation (Signed)
Physical Therapy Evaluation Patient Details Name: Frederick Vasquez MRN: [id number] DOB: 01-29-1941 Today's Date: 05/16/2017   History of Present Illness  76 y.o. male admitted for sepsis. PMH significant of diastolic HF, HLD, A-fib, pacemaker, and impaired glucose tolerance.   Clinical Impression  Pt presents with decreased endurance, decreased strength, impaired mobility, and decreased balance secondary to above. Pt ambulates 125-ft and demonstrates stair negotiation with min guard assist for safety. HR ranging from 80 bpm to 105 bpm during session. SpO2 ranging from 95-97% on RA. Wife present in room during session. Educated pt and wife regarding taking things a little slower than normal while pt heals, as he may be a little weak/deconditioned from hospital stay. Pt and wife agree. Pt will benefit from PT acutely in order to further assess balance and improve mobility status while in hospital setting. No PT follow-up recommended upon discharge, as wife reports pt near his baseline level of mobility.     Follow Up Recommendations No PT follow up    Equipment Recommendations  None recommended by PT    Recommendations for Other Services       Precautions / Restrictions Precautions Precautions: Fall Restrictions Weight Bearing Restrictions: No      Mobility  Bed Mobility Overal bed mobility: Needs Assistance Bed Mobility: Rolling;Sidelying to Sit Rolling: Supervision Sidelying to sit: Min guard;HOB elevated       General bed mobility comments: VCs for bed mobility. Min guard for safety, as pt with percieved struggle to push self upright.   Transfers Overall transfer level: Needs assistance Equipment used: None Transfers: Sit to/from Stand Sit to Stand: Min guard         General transfer comment: x1 from EOB. pt in chair post-ambulation.   Ambulation/Gait Ambulation/Gait assistance: Min guard Ambulation Distance (Feet): 125 Feet Assistive device: None Gait  Pattern/deviations: Step-through pattern;Decreased stride length;Wide base of support Gait velocity: decreased   General Gait Details: Pt with DOE 2/4 during ambulation. Pt and wife report this is normal for him.  HR ranging from 80 bpm to 105 bpm and SpO2 ranging from 95-97% on RA.   Stairs Stairs: Yes Stairs assistance: Min guard Stair Management: One rail Right;Step to pattern;Forwards Number of Stairs: 5 General stair comments: Pt with step-to pattern (most likely in order to stay near PT, who is holding Foley). however pt climbs stairs impulsively, going up more stairs than requested and starting to descend stairs backwards. Pt eventually follows commands, turning around and descending stairs forwards.   Wheelchair Mobility    Modified Rankin (Stroke Patients Only)       Balance Overall balance assessment: Needs assistance Sitting-balance support: No upper extremity supported;Feet supported Sitting balance-Leahy Scale: Good Sitting balance - Comments: Pt sits EOB without external support of UEs. Pt able to shift weight and scoot in chair.      Standing balance-Leahy Scale: Fair Standing balance comment: Pt able to maintain static standing balance without external support, however min guard for safety with dynamic movements, as pt can be impulsive.                              Pertinent Vitals/Pain Pain Assessment: No/denies pain    Home Living Family/patient expects to be discharged to:: Private residence Living Arrangements: Spouse/significant other Available Help at Discharge: Available 24 hours/day Type of Home: House Home Access: Stairs to enter Entrance Stairs-Rails: Psychiatric nurse of Steps: 4 Home Layout: One level Home  Equipment: None      Prior Function Level of Independence: Independent         Comments: Pr previously independent and able to perform chores. Pt retired and does his own yardwork.      Hand Dominance         Extremity/Trunk Assessment   Upper Extremity Assessment Upper Extremity Assessment: Defer to OT evaluation    Lower Extremity Assessment Lower Extremity Assessment: Overall WFL for tasks assessed    Cervical / Trunk Assessment Cervical / Trunk Assessment: Normal  Communication      Cognition Arousal/Alertness: Awake/alert Behavior During Therapy: Impulsive;WFL for tasks assessed/performed Overall Cognitive Status: No family/caregiver present to determine baseline cognitive functioning                                 General Comments: Pt generally loud, talkative, and impulsive. Wife present and does not mention that it is anything different from his ordinary.       General Comments      Exercises     Assessment/Plan    PT Assessment Patient needs continued PT services  PT Problem List Decreased strength;Decreased mobility;Decreased safety awareness;Decreased activity tolerance;Decreased balance       PT Treatment Interventions Functional mobility training;Balance training;Gait training;Therapeutic activities;Neuromuscular re-education;Patient/family education;Stair training;Therapeutic exercise    PT Goals (Current goals can be found in the Care Plan section)  Acute Rehab PT Goals Patient Stated Goal: to return home PT Goal Formulation: With patient/family Time For Goal Achievement: 05/30/17 Potential to Achieve Goals: Good    Frequency Min 3X/week   Barriers to discharge        Co-evaluation               AM-PAC PT "6 Clicks" Daily Activity  Outcome Measure Difficulty turning over in bed (including adjusting bedclothes, sheets and blankets)?: None Difficulty moving from lying on back to sitting on the side of the bed? : A Little Difficulty sitting down on and standing up from a chair with arms (e.g., wheelchair, bedside commode, etc,.)?: A Little Help needed moving to and from a bed to chair (including a wheelchair)?: A Little Help  needed walking in hospital room?: A Little Help needed climbing 3-5 steps with a railing? : A Little 6 Click Score: 19    End of Session Equipment Utilized During Treatment: Gait belt Activity Tolerance: Patient tolerated treatment well Patient left: in chair;with call bell/phone within reach;with family/visitor present   PT Visit Diagnosis: Unsteadiness on feet (R26.81);Other abnormalities of gait and mobility (R26.89);Muscle weakness (generalized) (M62.81)    Time: 1610-9604 PT Time Calculation (min) (ACUTE ONLY): 17 min   Charges:   PT Evaluation $PT Eval Low Complexity: 1 Low     PT G Codes:        Judee Clara, SPT  Judee Clara 05/16/2017, 10:53 AM

## 2017-05-16 NOTE — Progress Notes (Signed)
RN paged C. Bodenheimer, NP to make him aware patient has bladder scanner reading of greater than 999 ml, awaiting response. P.J. Linus Mako, RN

## 2017-05-17 LAB — BASIC METABOLIC PANEL
ANION GAP: 8 (ref 5–15)
BUN: 10 mg/dL (ref 6–20)
CALCIUM: 8.3 mg/dL — AB (ref 8.9–10.3)
CO2: 24 mmol/L (ref 22–32)
Chloride: 102 mmol/L (ref 101–111)
Creatinine, Ser: 0.91 mg/dL (ref 0.61–1.24)
GFR calc non Af Amer: >60 mL/min (ref >60)
GLUCOSE: 112 mg/dL — AB (ref 65–99)
POTASSIUM: 3.2 mmol/L — AB (ref 3.5–5.1)
Sodium: 134 mmol/L — ABNORMAL LOW (ref 135–145)

## 2017-05-17 LAB — CBC WITH DIFFERENTIAL/PLATELET
BASOS ABS: 0 10*3/uL (ref 0.0–0.1)
BASOS PCT: 0 %
Eosinophils Absolute: 0.1 10*3/uL (ref 0.0–0.7)
Eosinophils Relative: 3 %
HEMATOCRIT: 23.8 % — AB (ref 39.0–52.0)
HEMOGLOBIN: 8.1 g/dL — AB (ref 13.0–17.0)
LYMPHS PCT: 21 %
Lymphs Abs: 0.6 10*3/uL — ABNORMAL LOW (ref 0.7–4.0)
MCH: 31.2 pg (ref 26.0–34.0)
MCHC: 34 g/dL (ref 30.0–36.0)
MCV: 91.5 fL (ref 78.0–100.0)
MONO ABS: 0.4 10*3/uL (ref 0.1–1.0)
MONOS PCT: 15 %
NEUTROS ABS: 1.7 10*3/uL (ref 1.7–7.7)
NEUTROS PCT: 61 %
Platelets: 87 10*3/uL — ABNORMAL LOW (ref 150–400)
RBC: 2.6 MIL/uL — ABNORMAL LOW (ref 4.22–5.81)
RDW: 13.1 % (ref 11.5–15.5)
WBC: 2.8 10*3/uL — ABNORMAL LOW (ref 4.0–10.5)

## 2017-05-17 MED ORDER — POTASSIUM CHLORIDE CRYS ER 20 MEQ PO TBCR
40.0000 meq | EXTENDED_RELEASE_TABLET | Freq: Once | ORAL | Status: AC
  Administered 2017-05-17: 40 meq via ORAL
  Filled 2017-05-17: qty 2

## 2017-05-17 MED ORDER — LOSARTAN POTASSIUM 100 MG PO TABS: 50.0000 mg | ORAL_TABLET | Freq: Every day | ORAL | Status: DC

## 2017-05-17 MED ORDER — TAMSULOSIN HCL 0.4 MG PO CAPS: 0.4000 mg | ORAL_CAPSULE | Freq: Every day | ORAL | 0 refills | Status: DC

## 2017-05-17 NOTE — Care Management Note (Signed)
Case Management Note  Patient Details  Name: Frederick Vasquez MRN: [id number] Date of Birth: 16-Aug-1940  Subjective/Objective:  Admitted for sepsis. PMH significant of diastolic HF, HLD, A-fib, pacemaker, and impaired glucose tolerance.                  PCP: Dr: Teressa Lower  Action/Plan: Transition to home. No present needs identified per CM.  Expected Discharge Date:  05/17/17               Expected Discharge Plan:  Home/Self Care  In-House Referral:     Discharge planning Services  CM Consult  Post Acute Care Choice:    Choice offered to:     DME Arranged:    DME Agency:     HH Arranged:    HH Agency:     Status of Service:  Completed, signed off  If discussed at [employer] of Stay Meetings, dates discussed:    Additional Comments:  Sharin Mons, RN 05/17/2017, 12:45 PM

## 2017-05-17 NOTE — Discharge Summary (Signed)
Discharge Summary  Frederick Vasquez:[id number] DOB: [birth year]  PCP: Algis Greenhouse, MD  Admit date: 05/14/2017 Discharge date: 05/17/2017  Time spent: >64mins  Recommendations for Outpatient Follow-up:  1. PCP 2. Cardiology  Discharge Diagnoses:  Active Hospital Problems   Diagnosis Date Noted  . Epistaxis 05/15/2017  . Aortic stenosis 05/15/2017  . Elevated troponin 05/15/2017  . Atrial fibrillation, chronic ([place]) 05/15/2017  . Chronic diastolic CHF (congestive heart failure) ([place]) 05/15/2017  . Essential hypertension 05/15/2017  . Hyperlipidemia 05/15/2017  . Impaired glucose tolerance 05/15/2017  . Thrombocytopenia ([place]) 05/15/2017  . Severe malnutrition ([place]) 05/15/2017  . Sepsis ([place]) 05/14/2017    Resolved Hospital Problems  No resolved problems to display.    Discharge Condition: Stable  Diet recommendation: Heart healthy  Vitals:   05/17/17 0530 05/17/17 0948  BP: (!) 117/53 (!) 111/52  Pulse: 72 73  Resp: 20   Temp: 98.3 F (36.8 C)   SpO2: 99%     History of present illness:  Frederick Vasquez a 76 y.o.malewith medical history significant ofdiastolic heart failure; HTN; HLD, afib on Xarelto; pacemaker placement; and impaired glucose tolerance presenting as a transfer from [place].Initially, he developed epistaxis. He had it packed at ENT on Thursday AM. He went back Friday to have it repacked after it got dislodged. Patient was subsequently noted to be febrile T103 at that time. Pt went to Urgent Care, was noted to be hypotensive and tachycardic, sent to the ER at [facility]. UA, chest x-ray EKG were all okay. Pt was started on empiric antibiotics and IV fluids, with resolved hypotension. Pt was noted to have elevated troponin 3.4. There is concern about demand ischemia. Pt transferred to [facility] for further cardiac workup and management  Today, pt reported feeling better, was able to urinate, no retension noted. Denies fever/chills, abdominal pain,  chest pain, SOB, N/V/D/C, dizziness.   Hospital Course:  Active Problems:   Sepsis ([place])   Epistaxis   Aortic stenosis   Elevated troponin   Atrial fibrillation, chronic (HCC)   Chronic diastolic CHF (congestive heart failure) (HCC)   Essential hypertension   Hyperlipidemia   Impaired glucose tolerance   Thrombocytopenia (HCC)   Severe malnutrition (HCC)  #Sepsis Resolved, unknown etiology -Fever, tachycardia withnormallactate and borderline hypotension on presentation -Currently afebrile, no leukocytosis -Procalcitonin 0.69 -Blood and urine cultures no growth so far -On telemetry and continue to monitor -S/P IV Zosyn + IV Vanc Follow up with PCP on 05/19/17, scheduled appt  #Elevated troponin with moderate AS Currently chest pain free -??demand ischemia in the setting of sepsis -Troponin peaked at 2.7-->1.34 -Echo done at [facility] on 12/22 showed LVEF 55%, pacemaker in place, worsening AS,   with new mitral regurgitation, no vegetations noted -Dr Baxter Hire at [facility] was concerned on the possibility of endocarditis and rec TEE and    also possible cardic cath -Cardiology on board, will do TEE if [facility] positive or persistent fever. No other recs -Pt to follow with outpt Cardiology  #Epistaxis -Likely related to anticoagulation and thrombocytopenia -Nasal packing is in place X 1 wk -ENT follow scheduled as outpt on 05/19/17 for nasal packing removal -PCP, cardiology consider switching rivaroxaban if epistaxis persists  #Acute urinary retension Likely due to BPH given nocturia, dribbling  Started on flomax, continue CT abd/pelvis: Mod stool throughout colon, Mildly enlarged prostate Pt encouraged to use stool softners  #Afib on Xarelto -Rate controlled, SR w/ V pacing CHA2DS2VASc= 5 Continue lopressor, xerolto  #Thrombocytopenia ongoing -Patient with  prior thrombocytopenia dating back to 2016 -Could be related to Xarelto, as this is a known serious side  effect of the medication -Consider switching DOAC  #Chronic diastolic heart failure -Clinically compensated  #HTN Stable -Reduced losartan to 50mg  daily due to soft BP -Continue Lopressor  #HLD -Continue crestor   Procedures:  None  Consultations:  Cardiology  Discharge Exam: BP (!) 111/52   Pulse 73   Temp 98.3 F (36.8 C) (Oral)   Resp 20   Ht 5\' 10"  (1.778 m)   Wt 109.6 kg (241 lb 9.6 oz)   SpO2 99%   BMI 34.67 kg/m   General:AAO X 3, NAD  Cardiovascular: S1-S2 present, +murmur noted Respiratory: Chest clear bilaterally   Discharge Instructions You were cared for by a hospitalist during your hospital stay. If you have any questions about your discharge medications or the care you received while you were in the hospital after you are discharged, you can call the unit and asked to speak with the hospitalist on call if the hospitalist that took care of you is not available. Once you are discharged, your primary care physician will handle any further medical issues. Please note that NO REFILLS for any discharge medications will be authorized once you are discharged, as it is imperative that you return to your primary care physician (or establish a relationship with a primary care physician if you do not have one) for your aftercare needs so that they can reassess your need for medications and monitor your lab values.  Discharge Instructions    Diet - low sodium heart healthy   Complete by:  As directed    Increase activity slowly   Complete by:  As directed      Allergies as of 05/17/2017      Reactions   Allopurinol Rash   uncertain   Atorvastatin Rash      Medication List    STOP taking these medications   amoxicillin-clavulanate 875-125 MG tablet Commonly known as:  AUGMENTIN   naproxen sodium 220 MG tablet Commonly known as:  ALEVE     TAKE these medications   losartan 100 MG tablet Commonly known as:  COZAAR Take 0.5 tablets (50 mg total) by  mouth daily. What changed:  how much to take   metoprolol tartrate 25 MG tablet Commonly known as:  LOPRESSOR Take 25 mg by mouth 2 (two) times daily.   mupirocin ointment 2 % Commonly known as:  BACTROBAN Place 1 application into the nose 2 (two) times daily as needed for itching.   nitroGLYCERIN 0.4 MG SL tablet Commonly known as:  NITROSTAT Place 0.4 mg under the tongue every 5 (five) minutes as needed for chest pain.   rivaroxaban 20 MG Tabs tablet Commonly known as:  XARELTO Take 20 mg by mouth daily with supper.   rosuvastatin 20 MG tablet Commonly known as:  CRESTOR Take 20 mg by mouth at bedtime.   tamsulosin 0.4 MG Caps capsule Commonly known as:  FLOMAX Take 1 capsule (0.4 mg total) by mouth daily after supper.      Allergies  Allergen Reactions  . Allopurinol Rash    uncertain  . Atorvastatin Rash   Follow-up Information    Park Liter, MD. Schedule an appointment as soon as possible for a visit in 2 week(s).   Specialty:  Cardiology Contact information: [place] [address] [phone]            The results of  significant diagnostics from this hospitalization (including imaging, microbiology, ancillary and laboratory) are listed below for reference.    Significant Diagnostic Studies: Ct Abdomen Pelvis W Contrast  Result Date: 05/16/2017 CLINICAL DATA:  76 year old male with urinary retention and scrotal pain. Concern for bladder outlet obstruction. EXAM: CT ABDOMEN AND PELVIS WITH CONTRAST TECHNIQUE: Multidetector CT imaging of the abdomen and pelvis was performed using the standard protocol following bolus administration of intravenous contrast. CONTRAST:  196mL ISOVUE-300 IOPAMIDOL (ISOVUE-300) INJECTION 61% COMPARISON:  Abdominal ultrasound dated 09/05/2014 FINDINGS: Lower chest: Partially visualized small right pleural effusion. There is diffuse interstitial and interlobular septal prominence of the visualized  lung bases likely representing interstitial edema. Clinical correlation is recommended. There is mild cardiomegaly. There is calcification of the mitral annulus. Cardiac pacemaker wire noted. 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 No intra-abdominal free air or free fluid. Hepatobiliary: The liver is unremarkable. No intrahepatic biliary duct dilatation. There multiple stones within the gallbladder. No pericholecystic fluid or evidence of acute cholecystitis by CT. Pancreas: Fatty infiltration of the pancreas. Spleen: Normal in size without focal abnormality. Adrenals/Urinary Tract: The adrenal glands are unremarkable. Subcentimeter right renal upper pole hypodense lesion is too small to characterize. The kidneys are otherwise unremarkable. There is symmetric enhancement and excretion of contrast by both kidneys. The visualized in ureters appear unremarkable. The urinary bladder is decompressed around a Foley catheter. Stomach/Bowel: There is moderate stool throughout the colon. No evidence of bowel obstruction or active inflammation. Normal appendix. Vascular/Lymphatic: Mild aortoiliac atherosclerotic disease. The origins of the celiac axis, SMA, IMA and the renal arteries are patent. No portal venous gas. There is no adenopathy. Reproductive: Mildly enlarged prostate gland measures 5 cm in transverse diameter. Other: Mild diffuse subcutaneous edema.  No fluid collection Musculoskeletal: Mild degenerative changes of the spine. No acute osseous pathology. 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 IMPRESSION: 1. No hydronephrosis.  Decompressed bladder around a Foley catheter. 2. No bowel obstruction or active inflammation.  Normal appendix. 3. Small right pleural effusion with findings suggestive of CHF. Clinical correlation is recommended. Electronically Signed   By: Anner Crete M.D.   On:  05/16/2017 18:59    Microbiology: No results found for this or any previous visit (from the past 240 hour(s)).   Labs: Basic Metabolic Panel: Recent Labs  Lab 05/14/17 2256 05/15/17 0508 05/16/17 0343 05/17/17 0256  NA 130* 130* 136 134*  K 3.2* 3.2* 3.7 3.2*  CL 101 104 106 102  CO2 21* 21* 25 24  GLUCOSE 128* 133* 141* 112*  BUN 31* 26* 16 10  CREATININE 1.01 0.92 0.96 0.91  CALCIUM 8.1* 8.0* 8.4* 8.3*   Liver Function Tests: Recent Labs  Lab 05/14/17 2256  AST 33  ALT 22  ALKPHOS 48  BILITOT 0.7  PROT 5.6*  ALBUMIN 2.8*   No results for input(s): LIPASE, AMYLASE in the last 168 hours. No results for input(s): AMMONIA in the last 168 hours. CBC: Recent Labs  Lab 05/14/17 2256 05/15/17 0508 05/16/17 0343 05/17/17 0256  WBC 5.5 4.1 3.5* 2.8*  NEUTROABS  --   --  2.5 1.7  HGB 8.7* 8.3* 8.3* 8.1*  HCT 24.6* 23.5* 24.0* 23.8*  MCV 90.4 90.7 91.3 91.5  PLT 65* 61* 78* 87*   Cardiac Enzymes: Recent Labs  Lab 05/14/17 2256 05/15/17 0508 05/15/17 1015  TROPONINI 2.70* 2.03* 1.34*   BNP: BNP (last 3  results) No results for input(s): BNP in the last 8760 hours.  ProBNP (last 3 results) No results for input(s): PROBNP in the last 8760 hours.  CBG: No results for input(s): GLUCAP in the last 168 hours.     Signed:  Alma Friendly, MD [facility] 05/17/2017, 4:34 PM

## 2017-05-17 NOTE — Progress Notes (Signed)
Frederick Vasquez to be D/C'd Home per MD order.  Discussed with the patient and all questions fully answered.  VSS, Skin clean, dry and intact without evidence of skin break down, no evidence of skin tears noted. IV catheter discontinued intact. Site without signs and symptoms of complications. Dressing and pressure applied.  An After Visit Summary was printed and given to the patient. Patient received prescription.  D/c education completed with patient/family including follow up instructions, medication list, d/c activities limitations if indicated, with other d/c instructions as indicated by MD - patient able to verbalize understanding, all questions fully answered.   Patient instructed to return to ED, call 911, or call MD for any changes in condition.   Patient escorted via [place], and D/C home via private auto.  [name] 05/17/2017 5:19 PM

## 2017-05-17 NOTE — Progress Notes (Signed)
New rash spots noted to face and w/ complaints of being itchy. New orders placed. Will continue to monitor.

## 2017-06-14 ENCOUNTER — Ambulatory Visit (INDEPENDENT_AMBULATORY_CARE_PROVIDER_SITE_OTHER): Payer: Medicare Other | Admitting: Cardiology

## 2017-06-14 ENCOUNTER — Encounter: Payer: Self-pay | Admitting: Cardiology

## 2017-06-14 VITALS — BP 132/78 | HR 79 | Ht 70.0 in | Wt 237.4 lb

## 2017-06-14 DIAGNOSIS — I480 Paroxysmal atrial fibrillation: Secondary | ICD-10-CM

## 2017-06-14 DIAGNOSIS — A419 Sepsis, unspecified organism: Secondary | ICD-10-CM | POA: Diagnosis not present

## 2017-06-14 DIAGNOSIS — R040 Epistaxis: Secondary | ICD-10-CM | POA: Diagnosis not present

## 2017-06-14 DIAGNOSIS — D696 Thrombocytopenia, unspecified: Secondary | ICD-10-CM

## 2017-06-14 DIAGNOSIS — Z7901 Long term (current) use of anticoagulants: Secondary | ICD-10-CM

## 2017-06-14 DIAGNOSIS — Z0389 Encounter for observation for other suspected diseases and conditions ruled out: Secondary | ICD-10-CM | POA: Diagnosis not present

## 2017-06-14 DIAGNOSIS — I10 Essential (primary) hypertension: Secondary | ICD-10-CM

## 2017-06-14 DIAGNOSIS — IMO0001 Reserved for inherently not codable concepts without codable children: Secondary | ICD-10-CM

## 2017-06-14 HISTORY — DX: Long term (current) use of anticoagulants: Z79.01

## 2017-06-14 HISTORY — DX: Reserved for inherently not codable concepts without codable children: IMO0001

## 2017-06-14 NOTE — Patient Instructions (Addendum)
Medication Instructions:  Your physician recommends that you continue on your current medications as directed. Please refer to the Current Medication list given to you today.  Labwork: NONE  Testing/Procedures: NONE  Follow-Up: Your physician recommends that you schedule a follow-up appointment in: CALL DR [place] 3 MONTHS  Any Other Special Instructions Will Be Listed Below (If Applicable). CONTACT DR DOUGH REGARDING YOU FLOMAX   If you need a refill on your cardiac medications before your next appointment, please call your pharmacy.

## 2017-06-14 NOTE — Assessment & Plan Note (Signed)
Plts 87k

## 2017-06-14 NOTE — Assessment & Plan Note (Signed)
Second episode in a year with resultant significant anemia

## 2017-06-14 NOTE — Assessment & Plan Note (Signed)
CHADS VASC=5 

## 2017-06-14 NOTE — Assessment & Plan Note (Signed)
H/O PAF

## 2017-06-14 NOTE — Progress Notes (Signed)
06/14/2017 Frederick Vasquez   05-15-1941  [id number]  Primary Physician Garlon Hatchet, Jaymes Graff, MD Primary Cardiologist: Dr Agustin Cree  HPI:  Frederick Vasquez is a pleasant 77 y/o male from [place] followed by Dr Agustin Cree and Dr Garlon Hatchet. 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 He had epistaxis in Dec right before Christmas. He required packing by an ENT but it came out the next day after he sneezed and had to be replaced. A day or two later he presented to the ED at [facility] with ever 103. Blood cultures were obtained as well as an echo. Echo showed an EF of 55% with mild AS. He was transferred to [facility] for further evaluation secondary to the concern for possible endocarditis. He was evaluated by Dr Lovena Le. His blood cultures remained negative and conservative therapy was recommended. He is seeing me today as a post hospital follow up.   He is in the office with his wife and daughter. He has done well since discharge, no further epistaxis. Dr Garlon Hatchet has recently seen him as well and stopped his Cozaar secondary to low B/P.  He was significantly anemic with the last event and Dr Garlon Hatchet has f/u labs ordered in the next two weeks. The pt and family asked about the continued risk of being on Xarelto.  He apparently had an epistaxis episode last year as well that required packing. I suggested he make sure he has a humidifier and told them I would defer the question of long term anticoagulation to Dr Agustin Cree.    Current Outpatient Medications  Medication Sig Dispense Refill  . ferrous sulfate 325 (65 FE) MG EC tablet Take 325 mg by mouth 3 (three) times daily with meals.    . metoprolol tartrate (LOPRESSOR) 25 MG tablet Take 25 mg by mouth 2 (two) times daily.    . nitroGLYCERIN (NITROSTAT) 0.4 MG SL tablet Place 0.4 mg under the tongue every 5 (five) minutes as needed for chest pain.    . rivaroxaban (XARELTO) 20 MG TABS tablet Take 20 mg by mouth  daily with supper.    . rosuvastatin (CRESTOR) 20 MG tablet Take 20 mg by mouth at bedtime.    . tamsulosin (FLOMAX) 0.4 MG CAPS capsule Take 1 capsule (0.4 mg total) by mouth daily after supper. 30 capsule 0  . losartan (COZAAR) 100 MG tablet Take 0.5 tablets (50 mg total) by mouth daily.    . mupirocin ointment (BACTROBAN) 2 % Place 1 application into the nose 2 (two) times daily as needed for itching.     No current facility-administered medications for this visit.     Allergies  Allergen Reactions  . Allopurinol Rash    uncertain  . Atorvastatin Rash    Past Medical History:  Diagnosis Date  . Atrial fibrillation ([place])    Xarelto  . Diastolic heart failure ([place])   . Hyperlipidemia   . Hypertension   . Impaired glucose tolerance   . Pacemaker     Social History   Socioeconomic History  . Marital status: Married    Spouse name: Not on file  . Number of children: Not on file  . Years of education: Not on file  . Highest education level: Not on file  Social Needs  . Financial resource strain: Not on file  . Food insecurity - worry: Not on file  . Food insecurity - inability: Not on file  .  Transportation needs - medical: Not on file  . Transportation needs - non-medical: Not on file  Occupational History  . Occupation: retired  Tobacco Use  . Smoking status: Former Smoker    Last attempt to quit: 1978    Years since quitting: 41.0  . Smokeless tobacco: Never Used  Substance and Sexual Activity  . Alcohol use: No    Frequency: Never  . Drug use: No  . Sexual activity: Not on file  Other Topics Concern  . Not on file  Social History Narrative   Pt lives in [place]     Family History  Problem Relation Age of Onset  . CAD Father   . Stroke Father   . Colon cancer Sister      Review of Systems: General: negative for chills, fever, night sweats or weight changes.  Cardiovascular: negative for chest pain, dyspnea on exertion, edema, orthopnea,  palpitations, paroxysmal nocturnal dyspnea or shortness of breath Dermatological: negative for rash Respiratory: negative for cough or wheezing Urologic: negative for hematuria Abdominal: negative for nausea, vomiting, diarrhea, bright red blood per rectum, melena, or hematemesis Neurologic: negative for visual changes, syncope, or dizziness All other systems reviewed and are otherwise negative except as noted above.    Blood pressure 132/78, pulse 79, height 5\' 10"  (1.778 m), weight 237 lb 6.4 oz (107.7 kg).  General appearance: alert, cooperative, no distress, moderately obese and HOH, hearing aid in place Neck: no JVD and transmitted murmur Lungs: few scattered rhonchi Heart: regular rate and rhythm and 2/6 systolic murmur  AOV, preserved S2 Abdomen: obese Extremities: trace edema Skin: Skin color, texture, turgor normal. No rashes or lesions Neurologic: Grossly normal  EKG V paced  ASSESSMENT AND PLAN:   Epistaxis Second episode in a year with resultant significant anemia  Sepsis ([place]) Admitted with possible sepsis after an episode of epistaxis 05/15/17-tx'd from [facility] Blood cultures were negative, no TEE done  PAF (paroxysmal atrial fibrillation) (HCC) H/O PAF  Chronic anticoagulation CHADS VASC=4  Normal coronary arteries 2014 cath in [place]  Essential hypertension Cozaar recently stopped secondary to low B/P  Thrombocytopenia (HCC) Plts 87k   PLAN  I suggested the pt contact Dr Agustin Cree about the risk benefits of anticoagulation. He has a HAS-BLED score gives him a 4.1% risk for a bleeding event and he has a CHA2DS2 VASC= 4 (HTN, age, CHF) which gives him a 4.0% risk of thrombotic event. They will continue Xarelto till they contact him.   Kerin Ransom PA-C 06/14/2017 10:20 AM

## 2017-06-14 NOTE — Assessment & Plan Note (Signed)
2014 cath in [place]

## 2017-06-14 NOTE — Assessment & Plan Note (Signed)
Admitted with possible sepsis after an episode of epistaxis 05/15/17-tx'd from [facility] Blood cultures were negative, no TEE done

## 2017-06-14 NOTE — Assessment & Plan Note (Signed)
Cozaar recently stopped secondary to low B/P

## 2017-06-15 DIAGNOSIS — N320 Bladder-neck obstruction: Secondary | ICD-10-CM | POA: Insufficient documentation

## 2017-07-20 ENCOUNTER — Telehealth: Payer: Self-pay | Admitting: Cardiology

## 2017-07-20 MED ORDER — METOPROLOL TARTRATE 25 MG PO TABS: 25.0000 mg | ORAL_TABLET | Freq: Two times a day (BID) | ORAL | 3 refills | Status: DC

## 2017-07-20 NOTE — Telephone Encounter (Signed)
Verified that pt will be seeing Dr Raliegh Ip in April 2019.  Pt advised that metoprolol 25mg  one tablet BID was sent to [facility] [address]  Abbott Pao verbalized understanding.

## 2017-07-20 NOTE — Telephone Encounter (Signed)
Needs a metoprolol refill sent please

## 2017-08-04 ENCOUNTER — Telehealth: Payer: Self-pay | Admitting: Cardiology

## 2017-08-04 NOTE — Telephone Encounter (Signed)
States he went and saw Theda Belfast today and that he told him he's been in afib since January

## 2017-08-04 NOTE — Telephone Encounter (Signed)
Patient scheduled for appointment tomorrow, 08/05/2017.

## 2017-08-05 ENCOUNTER — Encounter: Payer: Self-pay | Admitting: Cardiology

## 2017-08-05 ENCOUNTER — Ambulatory Visit (INDEPENDENT_AMBULATORY_CARE_PROVIDER_SITE_OTHER): Payer: Medicare Other | Admitting: Cardiology

## 2017-08-05 VITALS — BP 140/64 | HR 74 | Resp 96 | Ht 70.0 in | Wt 240.8 lb

## 2017-08-05 DIAGNOSIS — I5032 Chronic diastolic (congestive) heart failure: Secondary | ICD-10-CM

## 2017-08-05 DIAGNOSIS — I442 Atrioventricular block, complete: Secondary | ICD-10-CM | POA: Diagnosis not present

## 2017-08-05 DIAGNOSIS — I4819 Other persistent atrial fibrillation: Secondary | ICD-10-CM | POA: Insufficient documentation

## 2017-08-05 DIAGNOSIS — I350 Nonrheumatic aortic (valve) stenosis: Secondary | ICD-10-CM

## 2017-08-05 DIAGNOSIS — I481 Persistent atrial fibrillation: Secondary | ICD-10-CM | POA: Diagnosis not present

## 2017-08-05 DIAGNOSIS — I10 Essential (primary) hypertension: Secondary | ICD-10-CM | POA: Diagnosis not present

## 2017-08-05 MED ORDER — AMIODARONE HCL 200 MG PO TABS: 200.0000 mg | ORAL_TABLET | Freq: Two times a day (BID) | ORAL | 3 refills | Status: DC

## 2017-08-05 NOTE — Addendum Note (Signed)
Addended by: Austin Miles on: 08/05/2017 02:15 PM   Modules accepted: Orders

## 2017-08-05 NOTE — Patient Instructions (Signed)
Medication Instructions:  Your physician has recommended you make the following change in your medication:  START amiodarone 200 mg twice daily  Labwork: None  Testing/Procedures: None  Follow-Up: Your physician recommends that you schedule a follow-up appointment in: 3 weeks.   Any Other Special Instructions Will Be Listed Below (If Applicable).     If you need a refill on your cardiac medications before your next appointment, please call your pharmacy.

## 2017-08-05 NOTE — Progress Notes (Signed)
Cardiology Office Note:    Date:  08/05/2017   ID:  Frederick Vasquez, DOB 01-22-1941, MRN [id number]  PCP:  Algis Greenhouse, MD  Cardiologist:  [name], MD    Referring MD: Algis Greenhouse, MD   Chief Complaint  Patient presents with  . Follow-up    Afib  . Hospitalization Follow-up  Being in the hospital and have atrial fibrillation  History of Present Illness:    Frederick Vasquez is a 77 y.o. male with paroxysmal atrial fibrillation, moderate aortic stenosis.  Status post pacemaker implantation.  He is being recently in the hospital in December because of some fever.  He also was confused.  Quite extensive evaluation has been done which included echocardiogram and eventually he was transferred to [facility] for potentially transesophageal echocardiogram as well as potentially cardiac catheterization however those were not done.  He was discharged home.  Recently he had his pacemaker interrogated and interrogation revealed the fact that he got persistent atrial fibrillation since the beginning of January.  He is here to talk about this problem.  He denies having any palpitation but he will feels weak tired exhausted.  He tells me that he went to hospital as a young man and left as an augment.  He is being persistently anticoagulated.  Apparently anticoagulation has been hold only while he was in the hospital.  He does have anemia that is getting better with some iron supplementation.  Past Medical History:  Diagnosis Date  . Atrial fibrillation ([place])    Xarelto  . Diastolic heart failure ([place])   . Hyperlipidemia   . Hypertension   . Impaired glucose tolerance   . Pacemaker     Past Surgical History:  Procedure Laterality Date  . CARDIAC CATHETERIZATION  2014   non-obs dz, done at [facility]  . PACEMAKER IMPLANT  03/2012  . REPLACEMENT TOTAL KNEE Left   . VASECTOMY      Current Medications: Current Meds  Medication Sig  . ferrous sulfate 325 (65 FE) MG EC tablet Take 325  mg by mouth 3 (three) times daily with meals.  Marland Kitchen losartan (COZAAR) 100 MG tablet Take 0.5 tablets (50 mg total) by mouth daily. (Patient taking differently: Take 50 mg by mouth as needed (blood pressure over 140). )  . metoprolol tartrate (LOPRESSOR) 25 MG tablet Take 1 tablet (25 mg total) by mouth 2 (two) times daily.  . nitroGLYCERIN (NITROSTAT) 0.4 MG SL tablet Place 0.4 mg under the tongue every 5 (five) minutes as needed for chest pain.  . rivaroxaban (XARELTO) 20 MG TABS tablet Take 20 mg by mouth daily with supper.  . rosuvastatin (CRESTOR) 20 MG tablet Take 20 mg by mouth at bedtime.  . tamsulosin (FLOMAX) 0.4 MG CAPS capsule Take 1 capsule (0.4 mg total) by mouth daily after supper.     Allergies:   Allopurinol and Atorvastatin   Social History   Socioeconomic History  . Marital status: Married    Spouse name: None  . Number of children: None  . Years of education: None  . Highest education level: None  Social Needs  . Financial resource strain: None  . Food insecurity - worry: None  . Food insecurity - inability: None  . Transportation needs - medical: None  . Transportation needs - non-medical: None  Occupational History  . Occupation: retired  Tobacco Use  . Smoking status: Former Smoker    Last attempt to quit: 1978    Years  since quitting: 41.2  . Smokeless tobacco: Never Used  Substance and Sexual Activity  . Alcohol use: No    Frequency: Never  . Drug use: No  . Sexual activity: None  Other Topics Concern  . None  Social History Narrative   Pt lives in [place]     Family History: The patient's family history includes CAD in his father; Colon cancer in his sister; Stroke in his father. ROS:   Please see the history of present illness.    All 14 point review of systems negative except as described per history of present illness  EKGs/Labs/Other Studies Reviewed:      Recent Labs: 05/14/2017: ALT 22 05/17/2017: BUN 10; Creatinine, Ser 0.91;  Hemoglobin 8.1; Platelets 87; Potassium 3.2; Sodium 134  Recent Lipid Panel No results found for: CHOL, TRIG, HDL, CHOLHDL, VLDL, LDLCALC, LDLDIRECT  Physical Exam:    VS:  BP 140/64   Pulse 74   Resp (!) 96   Ht 5\' 10"  (1.778 m)   Wt 240 lb 12.8 oz (109.2 kg)   BMI 34.55 kg/m     Wt Readings from Last 3 Encounters:  08/05/17 240 lb 12.8 oz (109.2 kg)  06/14/17 237 lb 6.4 oz (107.7 kg)  05/14/17 241 lb 9.6 oz (109.6 kg)     GEN:  Well nourished, well developed in no acute distress HEENT: Normal NECK: No JVD; No carotid bruits LYMPHATICS: No lymphadenopathy CARDIAC: Irregular irregular, stomach ejection murmur grade 2/6 to 3/6 best heard at the right upper portion of the sternum, no rubs, no gallops RESPIRATORY:  Clear to auscultation without rales, wheezing or rhonchi  ABDOMEN: Soft, non-tender, non-distended MUSCULOSKELETAL:  No edema; No deformity  SKIN: Warm and dry LOWER EXTREMITIES: no swelling NEUROLOGIC:  Alert and oriented x 3 PSYCHIATRIC:  Normal affect   ASSESSMENT:    1. AV block, complete (HCC)   2. Chronic diastolic CHF (congestive heart failure) ([place])   3. Essential hypertension   4. Moderate aortic stenosis   5. Persistent atrial fibrillation (HCC)    PLAN:    In order of problems listed above:  1. Complete high AV block.  Addressed with a pacemaker normal function interrogation reviewed. 2. Chronic diastolic congestive heart rate: Hemodynamically stable. 3. Essential hypertension: Blood pressure well controlled we will continue present management 4. Moderate aortic stenosis with mean gradient of 35.  Does not have any symptoms of it now.  In about 6 months we will repeat another echocardiogram. 5. Persistent atrial fibrillation.  I had a long discussion with him about what to do with the situation.  We elected to trial of amiodarone therapy I will put him on amiodarone 200 mg twice daily I will bring him back to my office in about 3 weeks and will  interrogate his device.  If he will be still in atrial fibrillation we will make arrangements for cardioversion.   Medication Adjustments/Labs and Tests Ordered: Current medicines are reviewed at length with the patient today.  Concerns regarding medicines are outlined above.  No orders of the defined types were placed in this encounter.  Medication changes: No orders of the defined types were placed in this encounter.   Signed, Park Liter, MD, [facility] 08/05/2017 2:07 PM    [place]

## 2017-08-26 ENCOUNTER — Ambulatory Visit (INDEPENDENT_AMBULATORY_CARE_PROVIDER_SITE_OTHER): Payer: Medicare Other | Admitting: Cardiology

## 2017-08-26 ENCOUNTER — Encounter: Payer: Self-pay | Admitting: Cardiology

## 2017-08-26 VITALS — BP 124/64 | HR 61 | Ht 70.0 in | Wt 239.0 lb

## 2017-08-26 DIAGNOSIS — I442 Atrioventricular block, complete: Secondary | ICD-10-CM | POA: Diagnosis not present

## 2017-08-26 DIAGNOSIS — I10 Essential (primary) hypertension: Secondary | ICD-10-CM | POA: Diagnosis not present

## 2017-08-26 DIAGNOSIS — I350 Nonrheumatic aortic (valve) stenosis: Secondary | ICD-10-CM

## 2017-08-26 DIAGNOSIS — I4819 Other persistent atrial fibrillation: Secondary | ICD-10-CM

## 2017-08-26 DIAGNOSIS — E785 Hyperlipidemia, unspecified: Secondary | ICD-10-CM | POA: Diagnosis not present

## 2017-08-26 DIAGNOSIS — I481 Persistent atrial fibrillation: Secondary | ICD-10-CM | POA: Diagnosis not present

## 2017-08-26 NOTE — Progress Notes (Signed)
Cardiology Office Note:    Date:  08/26/2017   ID:  SENAN UREY, DOB 1940/07/23, MRN [id number]  PCP:  Algis Greenhouse, MD  Cardiologist:  [name], MD    Referring MD: Algis Greenhouse, MD   Chief Complaint  Patient presents with  . 2 Week Follow-up  Feeling well  History of Present Illness:    Frederick Vasquez is a 77 y.o. male with now persistent atrial fibrillation.  In December he was in the hospital because of noncardiac reason he was discharged home and since that time it looks like he is in atrial fibrillation.  I did interrogated his device which show me that he started having atrial fibrillation at the end of December beginning of January.  I seen him last time about a month ago and we recognized the problem he was put on amiodarone 200 mg twice daily with hope that he will convert with this medication however that is not the case.  He is here today to talk about electrical cardioversion.  We talked about this procedure explained to him including all risk benefits as well as alternative.  He is willing to proceed. His past medical history includes known obstructive coronary artery disease: He also does have noncritical aortic stenosis with latest mean gradient in the neighborhood of 35 mmHg.  He is being anticoagulated for at least a month continuously with Xarelto which I will continue.  Past Medical History:  Diagnosis Date  . Atrial fibrillation ([place])    Xarelto  . Diastolic heart failure ([place])   . Hyperlipidemia   . Hypertension   . Impaired glucose tolerance   . Pacemaker     Past Surgical History:  Procedure Laterality Date  . CARDIAC CATHETERIZATION  2014   non-obs dz, done at [facility]  . PACEMAKER IMPLANT  03/2012  . REPLACEMENT TOTAL KNEE Left   . VASECTOMY      Current Medications: Current Meds  Medication Sig  . amiodarone (PACERONE) 200 MG tablet Take 1 tablet (200 mg total) by mouth 2 (two) times daily.  . ferrous sulfate 325 (65 FE) MG EC  tablet Take 325 mg by mouth 3 (three) times daily with meals.  Marland Kitchen losartan (COZAAR) 100 MG tablet Take 0.5 tablets (50 mg total) by mouth daily. (Patient taking differently: Take 50 mg by mouth as needed (blood pressure over 140). )  . metoprolol tartrate (LOPRESSOR) 25 MG tablet Take 1 tablet (25 mg total) by mouth 2 (two) times daily.  . nitroGLYCERIN (NITROSTAT) 0.4 MG SL tablet Place 0.4 mg under the tongue every 5 (five) minutes as needed for chest pain.  . rivaroxaban (XARELTO) 20 MG TABS tablet Take 20 mg by mouth daily with supper.  . rosuvastatin (CRESTOR) 20 MG tablet Take 20 mg by mouth at bedtime.  . tamsulosin (FLOMAX) 0.4 MG CAPS capsule Take 1 capsule (0.4 mg total) by mouth daily after supper.     Allergies:   Allopurinol and Atorvastatin   Social History   Socioeconomic History  . Marital status: Married    Spouse name: Not on file  . Number of children: Not on file  . Years of education: Not on file  . Highest education level: Not on file  Occupational History  . Occupation: retired  Scientific laboratory technician  . Financial resource strain: Not on file  . Food insecurity:    Worry: Not on file    Inability: Not on file  . Transportation needs:  Medical: Not on file    Non-medical: Not on file  Tobacco Use  . Smoking status: Former Smoker    Last attempt to quit: 1978    Years since quitting: 41.2  . Smokeless tobacco: Never Used  Substance and Sexual Activity  . Alcohol use: No    Frequency: Never  . Drug use: No  . Sexual activity: Not on file  Lifestyle  . Physical activity:    Days per week: Not on file    Minutes per session: Not on file  . Stress: Not on file  Relationships  . Social connections:    Talks on phone: Not on file    Gets together: Not on file    Attends religious service: Not on file    Active member of club or organization: Not on file    Attends meetings of clubs or organizations: Not on file    Relationship status: Not on file  Other  Topics Concern  . Not on file  Social History Narrative   Pt lives in [place]     Family History: The patient's family history includes CAD in his father; Colon cancer in his sister; Stroke in his father. ROS:   Please see the history of present illness.    All 14 point review of systems negative except as described per history of present illness  EKGs/Labs/Other Studies Reviewed:      Recent Labs: 05/14/2017: ALT 22 05/17/2017: BUN 10; Creatinine, Ser 0.91; Hemoglobin 8.1; Platelets 87; Potassium 3.2; Sodium 134  Recent Lipid Panel No results found for: CHOL, TRIG, HDL, CHOLHDL, VLDL, LDLCALC, LDLDIRECT  Physical Exam:    VS:  BP 124/64   Pulse 61   Ht 5\' 10"  (1.778 m)   Wt 239 lb (108.4 kg)   SpO2 97%   BMI 34.29 kg/m     Wt Readings from Last 3 Encounters:  08/26/17 239 lb (108.4 kg)  08/05/17 240 lb 12.8 oz (109.2 kg)  06/14/17 237 lb 6.4 oz (107.7 kg)     GEN:  Well nourished, well developed in no acute distress HEENT: Normal NECK: No JVD; No carotid bruits LYMPHATICS: No lymphadenopathy CARDIAC: RRR, systolic ejection murmur grade 2/6 best heard at the right upper portion of the sternum, no rubs, no gallops RESPIRATORY:  Clear to auscultation without rales, wheezing or rhonchi  ABDOMEN: Soft, non-tender, non-distended MUSCULOSKELETAL:  No edema; No deformity  SKIN: Warm and dry LOWER EXTREMITIES: no swelling NEUROLOGIC:  Alert and oriented x 3 PSYCHIATRIC:  Normal affect   ASSESSMENT:    1. AV block, complete (HCC)   2. Essential hypertension   3. Moderate aortic stenosis   4. Persistent atrial fibrillation ([place])   5. Hyperlipidemia, unspecified hyperlipidemia type    PLAN:    In order of problems listed above:  1. AV block complete: That being addressed with pacemaker. 2. Essential hypertension: Stable continue present management. 3. Moderate aortic stenosis: Will do echocardiogram in the summer. 4. Persistent atrial fibrillation: Plan as  outlined above.  He is being anticoagulated for more than a month I gave him trial of amiodarone 200 g twice daily but without success with converting.  He will be scheduled to have electrical cardioversion in [facility].  Procedure explained to him including all risk benefits as well as alternatives. 5. Dyslipidemia continue with Crestor.   Medication Adjustments/Labs and Tests Ordered: Current medicines are reviewed at length with the patient today.  Concerns regarding medicines are outlined above.  No orders  of the defined types were placed in this encounter.  Medication changes: No orders of the defined types were placed in this encounter.   Signed, Park Liter, MD, [facility] 08/26/2017 11:15 AM    [place]

## 2017-08-26 NOTE — Patient Instructions (Signed)
Medication Instructions:  Your physician recommends that you continue on your current medications as directed. Please refer to the Current Medication list given to you today.   Labwork: Your physician recommends that you have lab work today: CBC, BMP.  Testing/Procedures: Dear Frederick Vasquez,  You are scheduled for a Cardioversion on Friday, September 02, 2017 with Dr. Stanford Breed.  Please arrive at the [facility] (Main Entrance A) at [facility]: [address] [place], [place] [address] at 7 am.  DIET: Nothing to eat or drink after midnight except a sip of water with medications (see medication instructions below)  Medication Instructions:  Continue your anticoagulant: xarelto You will need to continue your anticoagulant after your procedure until you are told by your Provider that it is safe to stop.   Labs: None needed.  You must have a responsible person to drive you home and stay in the waiting area during your procedure. Failure to do so could result in cancellation.  Bring your insurance cards.  *Special Note: Every effort is made to have your procedure done on time. Occasionally there are emergencies that occur at the hospital that may cause delays. Please be patient if a delay does occur.    Follow-Up: Your physician recommends that you schedule a follow-up appointment in: 1 month.  Any Other Special Instructions Will Be Listed Below (If Applicable).     If you need a refill on your cardiac medications before your next appointment, please call your pharmacy.

## 2017-08-27 LAB — CBC
HEMATOCRIT: 37.7 % (ref 37.5–51.0)
Hemoglobin: 12.8 g/dL — ABNORMAL LOW (ref 13.0–17.7)
MCH: 32.2 pg (ref 26.6–33.0)
MCHC: 34 g/dL (ref 31.5–35.7)
MCV: 95 fL (ref 79–97)
PLATELETS: 134 10*3/uL — AB (ref 150–379)
RBC: 3.97 x10E6/uL — ABNORMAL LOW (ref 4.14–5.80)
RDW: 14.3 % (ref 12.3–15.4)
WBC: 6.2 10*3/uL (ref 3.4–10.8)

## 2017-08-27 LAB — BASIC METABOLIC PANEL
BUN / CREAT RATIO: 15 (ref 10–24)
BUN: 15 mg/dL (ref 8–27)
CALCIUM: 9 mg/dL (ref 8.6–10.2)
CO2: 22 mmol/L (ref 20–29)
Chloride: 103 mmol/L (ref 96–106)
Creatinine, Ser: 0.99 mg/dL (ref 0.76–1.27)
GFR, EST AFRICAN AMERICAN: 85 mL/min/{1.73_m2} (ref >59)
GFR, EST NON AFRICAN AMERICAN: 74 mL/min/{1.73_m2} (ref >59)
Glucose: 87 mg/dL (ref 65–99)
POTASSIUM: 4.4 mmol/L (ref 3.5–5.2)
SODIUM: 141 mmol/L (ref 134–144)

## 2017-09-01 NOTE — Anesthesia Preprocedure Evaluation (Addendum)
Anesthesia Evaluation  Patient identified by MRN, date of birth, ID band Patient awake    Reviewed: Allergy & Precautions, H&P , NPO status , Patient's Chart, lab work & pertinent test results, reviewed documented beta blocker date and time   Airway Mallampati: II  TM Distance: >3 FB Neck ROM: Full    Dental no notable dental hx. (+) Edentulous Upper, Edentulous Lower, Dental Advisory Given   Pulmonary neg pulmonary ROS, former smoker,    Pulmonary exam normal breath sounds clear to auscultation       Cardiovascular Exercise Tolerance: Good hypertension, Pt. on medications and Pt. on home beta blockers +CHF  negative cardio ROS  + dysrhythmias Atrial Fibrillation + pacemaker  Rhythm:Irregular Rate:Normal     Neuro/Psych negative neurological ROS  negative psych ROS   GI/Hepatic negative GI ROS, Neg liver ROS,   Endo/Other  negative endocrine ROS  Renal/GU negative Renal ROS  negative genitourinary   Musculoskeletal   Abdominal   Peds  Hematology negative hematology ROS (+)   Anesthesia Other Findings   Reproductive/Obstetrics negative OB ROS                           Anesthesia Physical Anesthesia Plan  ASA: III  Anesthesia Plan: General   Post-op Pain Management:    Induction: Intravenous  PONV Risk Score and Plan: 2 and Treatment may vary due to age or medical condition  Airway Management Planned: Mask  Additional Equipment:   Intra-op Plan:   Post-operative Plan:   Informed Consent: I have reviewed the patients History and Physical, chart, labs and discussed the procedure including the risks, benefits and alternatives for the proposed anesthesia with the patient or authorized representative who has indicated his/her understanding and acceptance.   Dental advisory given  Plan Discussed with: CRNA  Anesthesia Plan Comments:         Anesthesia Quick Evaluation

## 2017-09-02 ENCOUNTER — Other Ambulatory Visit: Payer: Self-pay

## 2017-09-02 ENCOUNTER — Ambulatory Visit (HOSPITAL_COMMUNITY): Payer: Medicare Other | Admitting: Anesthesiology

## 2017-09-02 ENCOUNTER — Ambulatory Visit (HOSPITAL_COMMUNITY)
Admission: RE | Admit: 2017-09-02 | Discharge: 2017-09-02 | Disposition: A | Discharge status: Home / Self Care | Payer: Medicare Other | Source: Ambulatory Visit | Attending: Cardiology | Admitting: Cardiology

## 2017-09-02 ENCOUNTER — Encounter (HOSPITAL_COMMUNITY)
Admission: RE | Disposition: A | Discharge status: Home / Self Care | Payer: Self-pay | Source: Ambulatory Visit | Attending: Cardiology

## 2017-09-02 ENCOUNTER — Encounter (HOSPITAL_COMMUNITY): Payer: Self-pay

## 2017-09-02 DIAGNOSIS — Z950 Presence of cardiac pacemaker: Secondary | ICD-10-CM | POA: Insufficient documentation

## 2017-09-02 DIAGNOSIS — I481 Persistent atrial fibrillation: Secondary | ICD-10-CM | POA: Insufficient documentation

## 2017-09-02 DIAGNOSIS — I350 Nonrheumatic aortic (valve) stenosis: Secondary | ICD-10-CM | POA: Diagnosis not present

## 2017-09-02 DIAGNOSIS — I110 Hypertensive heart disease with heart failure: Secondary | ICD-10-CM | POA: Insufficient documentation

## 2017-09-02 DIAGNOSIS — Z7901 Long term (current) use of anticoagulants: Secondary | ICD-10-CM | POA: Diagnosis not present

## 2017-09-02 DIAGNOSIS — E785 Hyperlipidemia, unspecified: Secondary | ICD-10-CM | POA: Insufficient documentation

## 2017-09-02 DIAGNOSIS — I2510 Atherosclerotic heart disease of native coronary artery without angina pectoris: Secondary | ICD-10-CM | POA: Diagnosis not present

## 2017-09-02 DIAGNOSIS — Z79899 Other long term (current) drug therapy: Secondary | ICD-10-CM | POA: Diagnosis not present

## 2017-09-02 DIAGNOSIS — Z87891 Personal history of nicotine dependence: Secondary | ICD-10-CM | POA: Insufficient documentation

## 2017-09-02 DIAGNOSIS — I442 Atrioventricular block, complete: Secondary | ICD-10-CM | POA: Insufficient documentation

## 2017-09-02 DIAGNOSIS — I4892 Unspecified atrial flutter: Secondary | ICD-10-CM | POA: Diagnosis not present

## 2017-09-02 DIAGNOSIS — I5030 Unspecified diastolic (congestive) heart failure: Secondary | ICD-10-CM | POA: Insufficient documentation

## 2017-09-02 HISTORY — PX: CARDIOVERSION: SHX1299

## 2017-09-02 SURGERY — CARDIOVERSION
Anesthesia: General

## 2017-09-02 MED ORDER — LIDOCAINE 2% (20 MG/ML) 5 ML SYRINGE
INTRAMUSCULAR | Status: DC | PRN
  Administered 2017-09-02: 60 mg via INTRAVENOUS

## 2017-09-02 MED ORDER — SODIUM CHLORIDE 0.9 % IV SOLN
INTRAVENOUS | Status: DC
  Administered 2017-09-02: 08:00:00 via INTRAVENOUS
  Administered 2017-09-02: 500 mL via INTRAVENOUS

## 2017-09-02 MED ORDER — SODIUM CHLORIDE 0.9 % IV SOLN: 250.0000 mL | INTRAVENOUS | Status: DC

## 2017-09-02 MED ORDER — PROPOFOL 10 MG/ML IV BOLUS
INTRAVENOUS | Status: DC | PRN
  Administered 2017-09-02: 50 mg via INTRAVENOUS

## 2017-09-02 MED ORDER — SODIUM CHLORIDE 0.9% FLUSH: 3.0000 mL | Freq: Two times a day (BID) | INTRAVENOUS | Status: DC

## 2017-09-02 MED ORDER — SODIUM CHLORIDE 0.9% FLUSH: 3.0000 mL | INTRAVENOUS | Status: DC | PRN

## 2017-09-02 NOTE — Interval H&P Note (Signed)
History and Physical Interval Note:  09/02/2017 7:40 AM  Frederick Vasquez  has presented today for surgery, with the diagnosis of A-FIB  The various methods of treatment have been discussed with the patient and family. After consideration of risks, benefits and other options for treatment, the patient has consented to  Procedure(s): CARDIOVERSION (N/A) as a surgical intervention .  The patient's history has been reviewed, patient examined, no change in status, stable for surgery.  I have reviewed the patient's chart and labs.  Questions were answered to the patient's satisfaction.     Kirk Ruths

## 2017-09-02 NOTE — Transfer of Care (Signed)
Immediate Anesthesia Transfer of Care Note  Patient: Frederick Vasquez  Procedure(s) Performed: CARDIOVERSION (N/A )  Patient Location: Endoscopy Unit  Anesthesia Type:General  Level of Consciousness: drowsy and patient cooperative  Airway & Oxygen Therapy: Patient Spontanous Breathing  Post-op Assessment: Report given to RN, Post -op Vital signs reviewed and stable and Patient moving all extremities X 4  Post vital signs: Reviewed and stable  Last Vitals:  Vitals Value Taken Time  BP    Temp    Pulse    Resp    SpO2      Last Pain:  Vitals:   09/02/17 0704  TempSrc: Oral         Complications: No apparent anesthesia complications

## 2017-09-02 NOTE — Discharge Instructions (Signed)
Electrical Cardioversion, Care After °This sheet gives you information about how to care for yourself after your procedure. Your health care provider may also give you more specific instructions. If you have problems or questions, contact your health care provider. °What can I expect after the procedure? °After the procedure, it is common to have: °· Some redness on the skin where the shocks were given. ° °Follow these instructions at home: °· Do not drive for 24 hours if you were given a medicine to help you relax (sedative). °· Take over-the-counter and prescription medicines only as told by your health care provider. °· Ask your health care provider how to check your pulse. Check it often. °· Rest for 48 hours after the procedure or as told by your health care provider. °· Avoid or limit your caffeine use as told by your health care provider. °Contact a health care provider if: °· You feel like your heart is beating too quickly or your pulse is not regular. °· You have a serious muscle cramp that does not go away. °Get help right away if: °· You have discomfort in your chest. °· You are dizzy or you feel faint. °· You have trouble breathing or you are short of breath. °· Your speech is slurred. °· You have trouble moving an arm or leg on one side of your body. °· Your fingers or toes turn cold or blue. °This information is not intended to replace advice given to you by your health care provider. Make sure you discuss any questions you have with your health care provider. °Document Released: 02/28/2013 Document Revised: 12/12/2015 Document Reviewed: 11/14/2015 °Elsevier Interactive Patient Education © 2018 [employer] ° °

## 2017-09-02 NOTE — H&P (Signed)
Office Visit   08/26/2017 [facility]    Park Liter, MD  Cardiology   AV block, complete [facility]) +4 more  Dx   2 Week Follow-up ; Referred by Algis Greenhouse, MD  Reason for Visit   Additional Documentation   Vitals:   BP 124/64   Pulse 61   Ht 5\' 10"  (1.778 m)   Wt 239 lb (108.4 kg)   SpO2 97%   BMI 34.29 kg/m   BSA 2.31 m      More Vitals   Flowsheets:   Anthropometrics,   MEWS Score     Encounter Info:   Billing Info,   History,   Allergies,   Detailed Report     All Notes   Progress Notes by Park Liter, MD at 08/26/2017 10:20 AM  Author: Park Liter, MD Author Type: Physician Filed: 08/29/2017 8:46 AM  Note Status: Signed Cosign: Cosign Not Required Encounter Date: 08/26/2017  Editor: Park Liter, MD (Physician)  Expand All Collapse All    Cardiology Office Note:    Date:  08/26/2017   ID:  Frederick Vasquez, DOB Oct 21, 1940, MRN [id number]  PCP:  Algis Greenhouse, MD    Cardiologist:  [name], MD    Referring MD: Algis Greenhouse, MD      Chief Complaint  Patient presents with  . 2 Week Follow-up  Feeling well  History of Present Illness:    Frederick Vasquez is a 77 y.o. male with now persistent atrial fibrillation.  In December he was in the hospital because of noncardiac reason he was discharged home and since that time it looks like he is in atrial fibrillation.  I did interrogated his device which show me that he started having atrial fibrillation at the end of December beginning of January.  I seen him last time about a month ago and we recognized the problem he was put on amiodarone 200 mg twice daily with hope that he will convert with this medication however that is not the case.  He is here today to talk about electrical cardioversion.  We talked about this procedure explained to him including all risk benefits as well as alternative.  He is willing to proceed. His past medical history includes known  obstructive coronary artery disease: He also does have noncritical aortic stenosis with latest mean gradient in the neighborhood of 35 mmHg.  He is being anticoagulated for at least a month continuously with Xarelto which I will continue.      Past Medical History:  Diagnosis Date  . Atrial fibrillation ([place])    Xarelto  . Diastolic heart failure ([place])   . Hyperlipidemia   . Hypertension   . Impaired glucose tolerance   . Pacemaker          Past Surgical History:  Procedure Laterality Date  . CARDIAC CATHETERIZATION  2014   non-obs dz, done at [facility]  . PACEMAKER IMPLANT  03/2012  . REPLACEMENT TOTAL KNEE Left   . VASECTOMY      Current Medications: Active Medications      Current Meds  Medication Sig  . amiodarone (PACERONE) 200 MG tablet Take 1 tablet (200 mg total) by mouth 2 (two) times daily.  . ferrous sulfate 325 (65 FE) MG EC tablet Take 325 mg by mouth 3 (three) times daily with meals.  Marland Kitchen losartan (COZAAR) 100 MG tablet Take 0.5 tablets (50 mg total) by mouth daily. (Patient  taking differently: Take 50 mg by mouth as needed (blood pressure over 140). )  . metoprolol tartrate (LOPRESSOR) 25 MG tablet Take 1 tablet (25 mg total) by mouth 2 (two) times daily.  . nitroGLYCERIN (NITROSTAT) 0.4 MG SL tablet Place 0.4 mg under the tongue every 5 (five) minutes as needed for chest pain.  . rivaroxaban (XARELTO) 20 MG TABS tablet Take 20 mg by mouth daily with supper.  . rosuvastatin (CRESTOR) 20 MG tablet Take 20 mg by mouth at bedtime.  . tamsulosin (FLOMAX) 0.4 MG CAPS capsule Take 1 capsule (0.4 mg total) by mouth daily after supper.       Allergies:   Allopurinol and Atorvastatin   Social History        Socioeconomic History  . Marital status: Married    Spouse name: Not on file  . Number of children: Not on file  . Years of education: Not on file  . Highest education level: Not on file  Occupational History  . Occupation: retired    Scientific laboratory technician  . Financial resource strain: Not on file  . Food insecurity:    Worry: Not on file    Inability: Not on file  . Transportation needs:    Medical: Not on file    Non-medical: Not on file  Tobacco Use  . Smoking status: Former Smoker    Last attempt to quit: 1978    Years since quitting: 41.2  . Smokeless tobacco: Never Used  Substance and Sexual Activity  . Alcohol use: No    Frequency: Never  . Drug use: No  . Sexual activity: Not on file  Lifestyle  . Physical activity:    Days per week: Not on file    Minutes per session: Not on file  . Stress: Not on file  Relationships  . Social connections:    Talks on phone: Not on file    Gets together: Not on file    Attends religious service: Not on file    Active member of club or organization: Not on file    Attends meetings of clubs or organizations: Not on file    Relationship status: Not on file  Other Topics Concern  . Not on file  Social History Narrative   Pt lives in [place]     Family History: The patient's family history includes CAD in his father; Colon cancer in his sister; Stroke in his father. ROS:   Please see the history of present illness.    All 14 point review of systems negative except as described per history of present illness  EKGs/Labs/Other Studies Reviewed:      Recent Labs: 05/14/2017: ALT 22 05/17/2017: BUN 10; Creatinine, Ser 0.91; Hemoglobin 8.1; Platelets 87; Potassium 3.2; Sodium 134  Recent Lipid Panel Labs (Brief)  No results found for: CHOL, TRIG, HDL, CHOLHDL, VLDL, LDLCALC, LDLDIRECT    Physical Exam:    VS:  BP 124/64   Pulse 61   Ht 5\' 10"  (1.778 m)   Wt 239 lb (108.4 kg)   SpO2 97%   BMI 34.29 kg/m        Wt Readings from Last 3 Encounters:  08/26/17 239 lb (108.4 kg)  08/05/17 240 lb 12.8 oz (109.2 kg)  06/14/17 237 lb 6.4 oz (107.7 kg)     GEN:  Well nourished, well developed in no acute distress HEENT:  Normal NECK: No JVD; No carotid bruits LYMPHATICS: No lymphadenopathy CARDIAC: RRR, systolic ejection murmur grade 2/6 best  heard at the right upper portion of the sternum, no rubs, no gallops RESPIRATORY:  Clear to auscultation without rales, wheezing or rhonchi  ABDOMEN: Soft, non-tender, non-distended MUSCULOSKELETAL:  No edema; No deformity  SKIN: Warm and dry LOWER EXTREMITIES: no swelling NEUROLOGIC:  Alert and oriented x 3 PSYCHIATRIC:  Normal affect   ASSESSMENT:    1. AV block, complete (HCC)   2. Essential hypertension   3. Moderate aortic stenosis   4. Persistent atrial fibrillation ([place])   5. Hyperlipidemia, unspecified hyperlipidemia type    PLAN:    In order of problems listed above:  1. AV block complete: That being addressed with pacemaker. 2. Essential hypertension: Stable continue present management. 3. Moderate aortic stenosis: Will do echocardiogram in the summer. 4. Persistent atrial fibrillation: Plan as outlined above.  He is being anticoagulated for more than a month I gave him trial of amiodarone 200 g twice daily but without success with converting.  He will be scheduled to have electrical cardioversion in [facility].  Procedure explained to him including all risk benefits as well as alternatives. 5. Dyslipidemia continue with Crestor.   Medication Adjustments/Labs and Tests Ordered: Current medicines are reviewed at length with the patient today.  Concerns regarding medicines are outlined above.  No orders of the defined types were placed in this encounter.  Medication changes: No orders of the defined types were placed in this encounter.   Signed, Park Liter, MD, [facility] 08/26/2017 11:15 AM    [place]; compliant with xarelto; no changes Kirk Ruths

## 2017-09-02 NOTE — Procedures (Signed)
Electrical Cardioversion Procedure Note Frederick Vasquez [id number] Jan 24, 1941  Procedure: Electrical Cardioversion Indications:  Atrial Fibrillation  Procedure Details Consent: Risks of procedure as well as the alternatives and risks of each were explained to the (patient/caregiver).  Consent for procedure obtained. Time Out: Verified patient identification, verified procedure, site/side was marked, verified correct patient position, special equipment/implants available, medications/allergies/relevent history reviewed, required imaging and test results available.  Performed  Patient placed on cardiac monitor, pulse oximetry, supplemental oxygen as necessary.  Sedation given: Pt sedated by anesthesia with lidocaine 60 mg and diprovan 50 mg IV. Pacer pads placed anterior and posterior chest.  Cardioverted 2 time(s).  Cardioverted at 120J; resulting in atrial fibrillation; second attempt with 150J resulted in AV pacing.  Evaluation Findings: Post procedure EKG shows: AV paced rhythm Complications: None Patient did tolerate procedure well.   Kirk Ruths 09/02/2017, 7:24 AM

## 2017-09-02 NOTE — Anesthesia Postprocedure Evaluation (Signed)
Anesthesia Post Note  Patient: Frederick Vasquez  Procedure(s) Performed: CARDIOVERSION (N/A )     Patient location during evaluation: PACU Anesthesia Type: General Level of consciousness: awake and alert Pain management: pain level controlled Vital Signs Assessment: post-procedure vital signs reviewed and stable Respiratory status: spontaneous breathing, nonlabored ventilation and respiratory function stable Cardiovascular status: blood pressure returned to baseline and stable Postop Assessment: no apparent nausea or vomiting Anesthetic complications: no    Last Vitals:  Vitals:   09/02/17 0830 09/02/17 0831  BP:  121/64  Pulse:  66  Resp:  (!) 21  Temp:    SpO2: 97% 96%    Last Pain:  Vitals:   09/02/17 0831  TempSrc:   PainSc: 0-No pain                 Karrington Studnicka,W. EDMOND

## 2017-09-26 ENCOUNTER — Encounter: Payer: Self-pay | Admitting: Cardiology

## 2017-09-26 ENCOUNTER — Ambulatory Visit (INDEPENDENT_AMBULATORY_CARE_PROVIDER_SITE_OTHER): Payer: Medicare Other | Admitting: Cardiology

## 2017-09-26 VITALS — BP 112/70 | HR 82 | Ht 70.0 in | Wt 236.0 lb

## 2017-09-26 DIAGNOSIS — I4819 Other persistent atrial fibrillation: Secondary | ICD-10-CM

## 2017-09-26 DIAGNOSIS — I480 Paroxysmal atrial fibrillation: Secondary | ICD-10-CM

## 2017-09-26 DIAGNOSIS — I481 Persistent atrial fibrillation: Secondary | ICD-10-CM

## 2017-09-26 DIAGNOSIS — Z950 Presence of cardiac pacemaker: Secondary | ICD-10-CM | POA: Diagnosis not present

## 2017-09-26 DIAGNOSIS — I442 Atrioventricular block, complete: Secondary | ICD-10-CM

## 2017-09-26 DIAGNOSIS — I10 Essential (primary) hypertension: Secondary | ICD-10-CM | POA: Diagnosis not present

## 2017-09-26 DIAGNOSIS — I350 Nonrheumatic aortic (valve) stenosis: Secondary | ICD-10-CM | POA: Diagnosis not present

## 2017-09-26 NOTE — Patient Instructions (Signed)
Medication Instructions:  Your physician recommends that you continue on your current medications as directed. Please refer to the Current Medication list given to you today.   Labwork: None  Testing/Procedures: You had an EKG today.   Follow-Up: Your physician recommends that you schedule a follow-up appointment in: 1 month.  Any Other Special Instructions Will Be Listed Below (If Applicable).     If you need a refill on your cardiac medications before your next appointment, please call your pharmacy.

## 2017-09-26 NOTE — Progress Notes (Signed)
Cardiology Office Note:    Date:  09/26/2017   ID:  Frederick Vasquez, DOB November 10, 1940, MRN [id number]  PCP:  Algis Greenhouse, MD  Cardiologist:  [name], MD    Referring MD: Algis Greenhouse, MD   Chief Complaint  Patient presents with  . 1 month follow up  Had cardioversion  History of Present Illness:    Frederick Vasquez is a 77 y.o. male with paroxysmal atrial fibrillation, moderate aortic stenosis, essential hypertension chronic diastolic congestive heart failure.  Comes to our office after elective electrical cardioversion.  He said he feels better every day he feels stronger denies having any palpitations.  Continue anticoagulation.  Past Medical History:  Diagnosis Date  . Atrial fibrillation ([place])    Xarelto  . Diastolic heart failure ([place])   . Hyperlipidemia   . Hypertension   . Impaired glucose tolerance   . Pacemaker     Past Surgical History:  Procedure Laterality Date  . CARDIAC CATHETERIZATION  2014   non-obs dz, done at [facility]  . CARDIOVERSION N/A 09/02/2017   Procedure: CARDIOVERSION;  Surgeon: Lelon Perla, MD;  Location: [facility] ENDOSCOPY;  Service: Cardiovascular;  Laterality: N/A;  . PACEMAKER IMPLANT  03/2012  . REPLACEMENT TOTAL KNEE Left   . VASECTOMY      Current Medications: Current Meds  Medication Sig  . acetaminophen (TYLENOL) 500 MG tablet Take 1,000 mg by mouth every 6 (six) hours as needed for moderate pain or headache.  Marland Kitchen amiodarone (PACERONE) 200 MG tablet Take 1 tablet (200 mg total) by mouth 2 (two) times daily.  . metoprolol tartrate (LOPRESSOR) 25 MG tablet Take 1 tablet (25 mg total) by mouth 2 (two) times daily.  . nitroGLYCERIN (NITROSTAT) 0.4 MG SL tablet Place 0.4 mg under the tongue every 5 (five) minutes as needed for chest pain.  . rivaroxaban (XARELTO) 20 MG TABS tablet Take 20 mg by mouth daily with supper.  . rosuvastatin (CRESTOR) 20 MG tablet Take 20 mg by mouth at bedtime.  . tamsulosin (FLOMAX) 0.4 MG CAPS capsule  Take 1 capsule (0.4 mg total) by mouth daily after supper.     Allergies:   Allopurinol and Atorvastatin   Social History   Socioeconomic History  . Marital status: Married    Spouse name: Not on file  . Number of children: Not on file  . Years of education: Not on file  . Highest education level: Not on file  Occupational History  . Occupation: retired  Scientific laboratory technician  . Financial resource strain: Not on file  . Food insecurity:    Worry: Not on file    Inability: Not on file  . Transportation needs:    Medical: Not on file    Non-medical: Not on file  Tobacco Use  . Smoking status: Former Smoker    Last attempt to quit: 1978    Years since quitting: 41.3  . Smokeless tobacco: Never Used  Substance and Sexual Activity  . Alcohol use: No    Frequency: Never  . Drug use: No  . Sexual activity: Not on file  Lifestyle  . Physical activity:    Days per week: Not on file    Minutes per session: Not on file  . Stress: Not on file  Relationships  . Social connections:    Talks on phone: Not on file    Gets together: Not on file    Attends religious service: Not on file  Active member of club or organization: Not on file    Attends meetings of clubs or organizations: Not on file    Relationship status: Not on file  Other Topics Concern  . Not on file  Social History Narrative   Pt lives in [place]     Family History: The patient's family history includes CAD in his father; Colon cancer in his sister; Stroke in his father. ROS:   Please see the history of present illness.    All 14 point review of systems negative except as described per history of present illness  EKGs/Labs/Other Studies Reviewed:      Recent Labs: 05/14/2017: ALT 22 08/26/2017: BUN 15; Creatinine, Ser 0.99; Hemoglobin 12.8; Platelets 134; Potassium 4.4; Sodium 141  Recent Lipid Panel No results found for: CHOL, TRIG, HDL, CHOLHDL, VLDL, LDLCALC, LDLDIRECT  Physical Exam:    VS:  BP 112/70    Pulse 82   Ht 5\' 10"  (1.778 m)   Wt 236 lb (107 kg)   SpO2 97%   BMI 33.86 kg/m     Wt Readings from Last 3 Encounters:  09/26/17 236 lb (107 kg)  09/02/17 239 lb (108.4 kg)  08/26/17 239 lb (108.4 kg)     GEN:  Well nourished, well developed in no acute distress HEENT: Normal NECK: No JVD; No carotid bruits LYMPHATICS: No lymphadenopathy CARDIAC: RRR, systolic ejection murmur best heard at the right upper portion of the sternum grade 2/6 to 3/6, no rubs, no gallops RESPIRATORY:  Clear to auscultation without rales, wheezing or rhonchi  ABDOMEN: Soft, non-tender, non-distended MUSCULOSKELETAL:  No edema; No deformity  SKIN: Warm and dry LOWER EXTREMITIES: no swelling NEUROLOGIC:  Alert and oriented x 3 PSYCHIATRIC:  Normal affect   ASSESSMENT:    1. Persistent atrial fibrillation ([place])   2. PAF (paroxysmal atrial fibrillation) ([place])   3. Moderate aortic stenosis   4. Essential hypertension   5. AV block, complete (HCC)   6. Presence of cardiac pacemaker    PLAN:    In order of problems listed above:  1. Paroxysmal atrial fibrillation.  He did have  cardioversion done few days ago.  We will do EKG today to check the rhythm.  We will continue anticoagulation as well as amiodarone 200 mg twice daily. 2. When I see him which will be in a month we will make an arrangements for his pacemaker to be interrogated to check how much if any atrial fibrillation he has if he still have recurrences of atrial fibrillation continue with amiodarone and he may require higher dose if not we will be able to lower amiodarone to 200 mg a day 3. Moderate aortic stenosis: Not critical we will continue monitoring 4. Pacemaker present.  Normal function.   Medication Adjustments/Labs and Tests Ordered: Current medicines are reviewed at length with the patient today.  Concerns regarding medicines are outlined above.  No orders of the defined types were placed in this encounter.  Medication  changes: No orders of the defined types were placed in this encounter.   Signed, Park Liter, MD, [facility] 09/26/2017 10:32 AM    [place]

## 2017-10-27 ENCOUNTER — Ambulatory Visit (INDEPENDENT_AMBULATORY_CARE_PROVIDER_SITE_OTHER): Payer: Medicare Other | Admitting: Cardiology

## 2017-10-27 ENCOUNTER — Encounter: Payer: Self-pay | Admitting: Cardiology

## 2017-10-27 VITALS — BP 136/64 | HR 76 | Ht 70.0 in | Wt 244.4 lb

## 2017-10-27 DIAGNOSIS — E785 Hyperlipidemia, unspecified: Secondary | ICD-10-CM

## 2017-10-27 DIAGNOSIS — I480 Paroxysmal atrial fibrillation: Secondary | ICD-10-CM

## 2017-10-27 DIAGNOSIS — I350 Nonrheumatic aortic (valve) stenosis: Secondary | ICD-10-CM

## 2017-10-27 DIAGNOSIS — M79605 Pain in left leg: Secondary | ICD-10-CM

## 2017-10-27 DIAGNOSIS — I442 Atrioventricular block, complete: Secondary | ICD-10-CM

## 2017-10-27 DIAGNOSIS — M79604 Pain in right leg: Secondary | ICD-10-CM | POA: Diagnosis not present

## 2017-10-27 MED ORDER — AMIODARONE HCL 200 MG PO TABS: 200.0000 mg | ORAL_TABLET | Freq: Every day | ORAL | 3 refills | Status: DC

## 2017-10-27 NOTE — Progress Notes (Signed)
Cardiology Office Note:    Date:  10/27/2017   ID:  Frederick Vasquez, DOB December 01, 1940, MRN [id number]  PCP:  Algis Greenhouse, MD  Cardiologist:  [name], MD    Referring MD: Algis Greenhouse, MD   Chief Complaint  Patient presents with  . 1 month follow up  Doing well  History of Present Illness:    Frederick Vasquez is a 77 y.o. male with moderate aortic stenosis, paroxysmal atrial fibrillation, status post recent cardioversion to sinus rhythm.  Anticoagulated.  Comes today to office for follow-up doing well but complained of having some shortness of breath with exertion.  Also complaining of having some pain in his calf when he walks.  Is having any palpitations.  Past Medical History:  Diagnosis Date  . Atrial fibrillation ([place])    Xarelto  . Diastolic heart failure ([place])   . Hyperlipidemia   . Hypertension   . Impaired glucose tolerance   . Pacemaker     Past Surgical History:  Procedure Laterality Date  . CARDIAC CATHETERIZATION  2014   non-obs dz, done at [facility]  . CARDIOVERSION N/A 09/02/2017   Procedure: CARDIOVERSION;  Surgeon: Lelon Perla, MD;  Location: [facility] ENDOSCOPY;  Service: Cardiovascular;  Laterality: N/A;  . PACEMAKER IMPLANT  03/2012  . REPLACEMENT TOTAL KNEE Left   . VASECTOMY      Current Medications: Current Meds  Medication Sig  . acetaminophen (TYLENOL) 500 MG tablet Take 1,000 mg by mouth every 6 (six) hours as needed for moderate pain or headache.  Marland Kitchen amiodarone (PACERONE) 200 MG tablet Take 1 tablet (200 mg total) by mouth 2 (two) times daily.  . metoprolol tartrate (LOPRESSOR) 25 MG tablet Take 1 tablet (25 mg total) by mouth 2 (two) times daily.  . nitroGLYCERIN (NITROSTAT) 0.4 MG SL tablet Place 0.4 mg under the tongue every 5 (five) minutes as needed for chest pain.  . rivaroxaban (XARELTO) 20 MG TABS tablet Take 20 mg by mouth daily with supper.  . rosuvastatin (CRESTOR) 20 MG tablet Take 20 mg by mouth at bedtime.  . tamsulosin  (FLOMAX) 0.4 MG CAPS capsule Take 1 capsule (0.4 mg total) by mouth daily after supper.     Allergies:   Allopurinol and Atorvastatin   Social History   Socioeconomic History  . Marital status: Married    Spouse name: Not on file  . Number of children: Not on file  . Years of education: Not on file  . Highest education level: Not on file  Occupational History  . Occupation: retired  Scientific laboratory technician  . Financial resource strain: Not on file  . Food insecurity:    Worry: Not on file    Inability: Not on file  . Transportation needs:    Medical: Not on file    Non-medical: Not on file  Tobacco Use  . Smoking status: Former Smoker    Last attempt to quit: 1978    Years since quitting: 41.4  . Smokeless tobacco: Never Used  Substance and Sexual Activity  . Alcohol use: No    Frequency: Never  . Drug use: No  . Sexual activity: Not on file  Lifestyle  . Physical activity:    Days per week: Not on file    Minutes per session: Not on file  . Stress: Not on file  Relationships  . Social connections:    Talks on phone: Not on file    Gets together: Not on file  Attends religious service: Not on file    Active member of club or organization: Not on file    Attends meetings of clubs or organizations: Not on file    Relationship status: Not on file  Other Topics Concern  . Not on file  Social History Narrative   Pt lives in [place]     Family History: The patient's family history includes CAD in his father; Colon cancer in his sister; Stroke in his father. ROS:   Please see the history of present illness.    All 14 point review of systems negative except as described per history of present illness  EKGs/Labs/Other Studies Reviewed:      Recent Labs: 05/14/2017: ALT 22 08/26/2017: BUN 15; Creatinine, Ser 0.99; Hemoglobin 12.8; Platelets 134; Potassium 4.4; Sodium 141  Recent Lipid Panel No results found for: CHOL, TRIG, HDL, CHOLHDL, VLDL, LDLCALC,  LDLDIRECT  Physical Exam:    VS:  BP 136/64   Pulse 76   Ht 5\' 10"  (1.778 m)   Wt 244 lb 6.4 oz (110.9 kg)   SpO2 96%   BMI 35.07 kg/m     Wt Readings from Last 3 Encounters:  10/27/17 244 lb 6.4 oz (110.9 kg)  09/26/17 236 lb (107 kg)  09/02/17 239 lb (108.4 kg)     GEN:  Well nourished, well developed in no acute distress HEENT: Normal NECK: No JVD; No carotid bruits LYMPHATICS: No lymphadenopathy CARDIAC: RRR, systolic ejection murmur grade 3/6 best heard at the right upper portion of the sternum, no rubs, no gallops RESPIRATORY:  Clear to auscultation without rales, wheezing or rhonchi  ABDOMEN: Soft, non-tender, non-distended MUSCULOSKELETAL:  No edema; No deformity  SKIN: Warm and dry LOWER EXTREMITIES: no swelling NEUROLOGIC:  Alert and oriented x 3 PSYCHIATRIC:  Normal affect   ASSESSMENT:    1. PAF (paroxysmal atrial fibrillation) ([place])   2. Moderate aortic stenosis   3. AV block, complete (HCC)   4. Dyslipidemia    PLAN:    In order of problems listed above:  1. Paroxysmal atrial fibrillation.  Will interrogate today device to see how much atrial fibrillation he has we will continue anticoagulation.  Based on interrogation of his device will decide about amiodarone therapy. 2. Moderate aortic stenosis: I will ask him to have echocardiogram next month and as per office. 3. Complete heart block: Will continue with the pacemaker. 4. Dyslipidemia followed by internal medicine team. 5. Pain in his calves while walking suspicious for claudications will do arterial duplex of lower extremities.  Back in office in 3 months or sooner if he get a problem   Medication Adjustments/Labs and Tests Ordered: Current medicines are reviewed at length with the patient today.  Concerns regarding medicines are outlined above.  No orders of the defined types were placed in this encounter.  Medication changes: No orders of the defined types were placed in this  encounter.   Signed, Park Liter, MD, [facility] 10/27/2017 10:17 AM    [place]

## 2017-10-27 NOTE — Patient Instructions (Addendum)
Medication Instructions:  Your physician has recommended you make the following change in your medication:  DECREASE amiodarone 200 mg (1 tablet) daily   Labwork: None  Testing/Procedures: Your physician has requested that you have an echocardiogram. Echocardiography is a painless test that uses sound waves to create images of your heart. It provides your doctor with information about the size and shape of your heart and how well your heart's chambers and valves are working. This procedure takes approximately one hour. There are no restrictions for this procedure.  Your physician has requested that you have a lower extremity arterial duplex. This test is an ultrasound of the arteries in the legs. It looks at arterial blood flow in the legs. Allow one hour for Lower Arterial scans. There are no restrictions or special instructions   **Your device was interrogated today.   Follow-Up: Your physician wants you to follow-up in: 3 months. You will receive a reminder letter in the mail two months in advance. If you don't receive a letter, please call our office to schedule the follow-up appointment.   If you need a refill on your cardiac medications before your next appointment, please call your pharmacy.   Thank you for choosing [facility]! Robyne Peers, RN [phone]

## 2017-10-28 ENCOUNTER — Other Ambulatory Visit (HOSPITAL_BASED_OUTPATIENT_CLINIC_OR_DEPARTMENT_OTHER): Payer: Self-pay | Admitting: *Deleted

## 2017-11-03 ENCOUNTER — Telehealth: Payer: Self-pay | Admitting: Cardiology

## 2017-11-03 NOTE — Telephone Encounter (Signed)
Please set up Thanks.

## 2017-11-03 NOTE — Telephone Encounter (Signed)
Frederick Vasquez patient. 

## 2017-11-03 NOTE — Telephone Encounter (Signed)
Good afternoon! Can you please set this patient up for a consult with Dr. Curt Bears?  Thanks!

## 2017-11-03 NOTE — Telephone Encounter (Signed)
Frederick Vasquez is asking that [name] be put on the [employer] Carelink for remote monitoring

## 2017-11-17 DIAGNOSIS — J61 Pneumoconiosis due to asbestos and other mineral fibers: Secondary | ICD-10-CM

## 2017-11-17 DIAGNOSIS — Z77090 Contact with and (suspected) exposure to asbestos: Secondary | ICD-10-CM

## 2017-11-17 HISTORY — DX: Contact with and (suspected) exposure to asbestos: Z77.090

## 2017-11-17 HISTORY — DX: Pneumoconiosis due to asbestos and other mineral fibers: J61

## 2017-11-25 ENCOUNTER — Telehealth: Payer: Self-pay | Admitting: Cardiology

## 2017-11-25 ENCOUNTER — Other Ambulatory Visit: Payer: Self-pay

## 2017-11-25 ENCOUNTER — Ambulatory Visit (INDEPENDENT_AMBULATORY_CARE_PROVIDER_SITE_OTHER): Payer: Medicare Other

## 2017-11-25 DIAGNOSIS — M79605 Pain in left leg: Secondary | ICD-10-CM

## 2017-11-25 DIAGNOSIS — M79604 Pain in right leg: Secondary | ICD-10-CM

## 2017-11-25 DIAGNOSIS — I442 Atrioventricular block, complete: Secondary | ICD-10-CM

## 2017-11-25 DIAGNOSIS — I480 Paroxysmal atrial fibrillation: Secondary | ICD-10-CM

## 2017-11-25 DIAGNOSIS — I350 Nonrheumatic aortic (valve) stenosis: Secondary | ICD-10-CM

## 2017-11-25 MED ORDER — RIVAROXABAN 20 MG PO TABS: 20.0000 mg | ORAL_TABLET | Freq: Every day | ORAL | 2 refills | Status: DC

## 2017-11-25 NOTE — Progress Notes (Signed)
Echocardiogram Performed [place]

## 2017-11-25 NOTE — Telephone Encounter (Signed)
Med refill has been sent. 

## 2017-11-25 NOTE — Telephone Encounter (Signed)
°*  STAT* If patient is at the pharmacy, call can be transferred to refill team.   1. Which medications need to be refilled? (please list name of each medication and dose if known) Xarelto   2. Which pharmacy/location (including street and city if local pharmacy) is medication to be sent to [facility]  3. Do they need a 30 day or 90 day supply?[place]

## 2017-11-25 NOTE — Progress Notes (Signed)
Lower extremity arterial duplex has been performed.   [place]

## 2017-11-29 ENCOUNTER — Encounter: Payer: Self-pay | Admitting: Cardiology

## 2017-12-14 ENCOUNTER — Ambulatory Visit (INDEPENDENT_AMBULATORY_CARE_PROVIDER_SITE_OTHER): Payer: Medicare Other | Admitting: Cardiology

## 2017-12-14 ENCOUNTER — Encounter: Payer: Self-pay | Admitting: Cardiology

## 2017-12-14 VITALS — BP 140/82 | HR 74 | Ht 70.0 in | Wt 245.8 lb

## 2017-12-14 DIAGNOSIS — I442 Atrioventricular block, complete: Secondary | ICD-10-CM

## 2017-12-14 DIAGNOSIS — I480 Paroxysmal atrial fibrillation: Secondary | ICD-10-CM

## 2017-12-14 DIAGNOSIS — E785 Hyperlipidemia, unspecified: Secondary | ICD-10-CM | POA: Diagnosis not present

## 2017-12-14 DIAGNOSIS — I350 Nonrheumatic aortic (valve) stenosis: Secondary | ICD-10-CM

## 2017-12-14 LAB — CUP PACEART INCLINIC DEVICE CHECK
Brady Statistic AP VP Percent: 69.3 %
Brady Statistic AP VS Percent: 0.1 % — CL
Brady Statistic AS VS Percent: 0.1 % — CL
Implantable Lead Implant Date: 20131113
Implantable Lead Location: 753859
Implantable Lead Model: 5092
Lead Channel Impedance Value: 643 Ohm
Lead Channel Pacing Threshold Amplitude: 0.75 V
Lead Channel Pacing Threshold Pulse Width: 0.4 ms
Lead Channel Pacing Threshold Pulse Width: 0.4 ms
Lead Channel Sensing Intrinsic Amplitude: 15.68 mV
Lead Channel Sensing Intrinsic Amplitude: 4 mV
MDC IDC LEAD IMPLANT DT: 20131113
MDC IDC LEAD LOCATION: 753860
MDC IDC MSMT BATTERY IMPEDANCE: 523 Ohm
MDC IDC MSMT BATTERY REMAINING LONGEVITY: 78 mo
MDC IDC MSMT BATTERY VOLTAGE: 2.78 V
MDC IDC MSMT LEADCHNL RA IMPEDANCE VALUE: 402 Ohm
MDC IDC MSMT LEADCHNL RA PACING THRESHOLD AMPLITUDE: 0.5 V
MDC IDC PG IMPLANT DT: 20131113
MDC IDC SESS DTM: 20190724165204
MDC IDC STAT BRADY AS VP PERCENT: 31.7 %

## 2017-12-14 NOTE — Progress Notes (Signed)
Electrophysiology Office Note   Date:  12/14/2017   ID:  Frederick Vasquez, DOB [birth year], MRN [id number]  PCP:  Algis Greenhouse, MD  Cardiologist:  Agustin Cree Primary Electrophysiologist:  Kyrstan Gotwalt Meredith Leeds, MD    Chief Complaint  Patient presents with  . Pacemaker Check    Complete heart block     History of Present Illness: [facility] Vasquez is a 77 y.o. male who is being seen today for the evaluation of atrial fibrillation, pacemaker at the request of [facility]quez. Presenting today for electrophysiology evaluation.  He has a history of paroxysmal atrial fibrillation, diastolic heart failure, hypertension, hyperlipidemia.  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    Today, he denies  symptoms of palpitations, chest pain, shortness of breath, orthopnea, PND, lower extremity edema, claudication, dizziness, presyncope, syncope, bleeding, or neurologic sequela. The patient is tolerating medications without difficulties.    Past Medical History:  Diagnosis Date  . Atrial fibrillation ([facility])    Xarelto  . Diastolic heart failure ([facility])   . Hyperlipidemia   . Hypertension   . Impaired glucose tolerance   . Pacemaker    Past Surgical History:  Procedure Laterality Date  . CARDIAC CATHETERIZATION  2014   non-obs dz, done at [facility]  . CARDIOVERSION N/A 09/02/2017   Procedure: CARDIOVERSION;  Surgeon: Lelon Perla, MD;  Location: [facility];  Service: Cardiovascular;  Laterality: N/A;  . PACEMAKER IMPLANT  03/2012  . REPLACEMENT TOTAL KNEE Left   . VASECTOMY       Current Outpatient Medications  Medication Sig Dispense Refill  . acetaminophen (TYLENOL) 500 MG tablet Take 1,000 mg by mouth every 6 (six) hours as needed for moderate pain or headache.    Marland Kitchen amiodarone (PACERONE) 200 MG tablet Take 1 tablet (200 mg total) by mouth daily. 90 tablet 3  . metoprolol tartrate (LOPRESSOR) 25 MG tablet Take 1 tablet (25 mg total) by mouth 2 (two) times  daily. 60 tablet 3  . nitroGLYCERIN (NITROSTAT) 0.4 MG SL tablet Place 0.4 mg under the tongue every 5 (five) minutes as needed for chest pain.    . rivaroxaban (XARELTO) 20 MG TABS tablet Take 1 tablet (20 mg total) by mouth daily with supper. 90 tablet 2  . rosuvastatin (CRESTOR) 20 MG tablet Take 20 mg by mouth at bedtime.    . tamsulosin (FLOMAX) 0.4 MG CAPS capsule Take 1 capsule (0.4 mg total) by mouth daily after supper. 30 capsule 0   No current facility-administered medications for this visit.     Allergies:   Allopurinol and Atorvastatin   Social History:  The patient  reports that he quit smoking about 41 years ago. He has never used smokeless tobacco. He reports that he does not drink alcohol or use drugs.   Family History:  The patient's family history includes CAD in his father; Colon cancer in his sister; Stroke in his father.    ROS:  Please see the history of present illness.   Otherwise, review of systems is positive for none.   All other systems are reviewed and negative.    PHYSICAL EXAM: VS:  BP 140/82   Pulse 74   Ht 5\' 10"  (1.778 m)   Wt 245 lb 12.8 oz (111.5 kg)   SpO2 95%   BMI 35.27 kg/m  , BMI Body mass index is 35.27 kg/m. GEN: Well nourished, well developed, in no acute distress  HEENT: normal  Neck: no JVD,  carotid bruits, or masses Cardiac: RRR; no murmurs, rubs, or gallops,no edema  Respiratory:  clear to auscultation bilaterally, normal work of breathing GI: soft, nontender, nondistended, + BS MS: no deformity or atrophy  Skin: warm and dry, device pocket is well healed Neuro:  Strength and sensation are intact Psych: euthymic mood, full affect  EKG:  EKG is not ordered today. Personal review of the ekg ordered 09/26/17 shows AV paced  Device interrogation is reviewed today in detail.  See PaceArt for details.   Recent Labs: 05/14/2017: ALT 22 08/26/2017: BUN 15; Creatinine, Ser 0.99; Hemoglobin 12.8; Platelets 134; Potassium 4.4; Sodium 141     Lipid Panel  No results found for: CHOL, TRIG, HDL, CHOLHDL, VLDL, LDLCALC, LDLDIRECT   Wt Readings from Last 3 Encounters:  12/14/17 245 lb 12.8 oz (111.5 kg)  10/27/17 244 lb 6.4 oz (110.9 kg)  09/26/17 236 lb (107 kg)      Other studies Reviewed: Additional studies/ records that were reviewed today include: TTE 11/25/17  Review of the above records today demonstrates:  - Left ventricle: The cavity size was normal. There was moderate   concentric hypertrophy. Systolic function was normal. The   estimated ejection fraction was in the range of 55% to 65%. Wall   motion was normal; there were no regional wall motion   abnormalities. Features are consistent with a pseudonormal left   ventricular filling pattern, with concomitant abnormal relaxation   and increased filling pressure (grade 2 diastolic dysfunction).   Doppler parameters are consistent with high ventricular filling   pressure. - Aortic valve: Trileaflet; severely thickened, moderately   calcified leaflets. Valve mobility was severely restricted. There   was moderate to severe stenosis. There was mild regurgitation.   Peak velocity (S): 403 cm/s. Mean velocity (S): 284 cm/s. Mean   gradient (S): 37 mm Hg. Peak gradient (S): 65 mm Hg. VTI ratio of   LVOT to aortic valve: 0.32. Valve area (VTI): 1.12 cm^2. Valve   area (Vmax): 1.15 cm^2. Valve area (Vmean): 1.08 cm^2. 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 - Mitral valve: Moderately to severely calcified, moderately   thickened annulus. The findings are consistent with mild   stenosis. There was mild regurgitation. Valve area by continuity   equation (using LVOT flow): 2.03 cm^2. - Left atrium: The atrium was severely dilated. - Pulmonary arteries: PA peak pressure: 40 mm Hg (S).   ASSESSMENT AND PLAN:  1.  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  Device functioning appropriately.  No changes.  2.  Paroxysmal atrial  fibrillation: Currently on Xarelto.  He in sinus rhythm.  No changes.  This patients CHA2DS2-VASc Score and unadjusted Ischemic Stroke Rate (% per year) is equal to 3.2 % stroke rate/year from a score of 3  Above score calculated as 1 point each if present [CHF, HTN, DM, Vascular=MI/PAD/Aortic Plaque, Age if 65-74, or Male] Above score calculated as 2 points each if present [Age > 75, or Stroke/TIA/TE]  3.  Moderate aortic stenosis: Followed by primary cardiology.  Plan for an echo in 1 month.  4.  Hyperlipidemia: Followed by internal medicine    Current medicines are reviewed at length with the patient today.   The patient does not have concerns regarding his medicines.  The following changes were made today:  none  Labs/ tests ordered today include:  No orders of the defined types were placed in this encounter.    Disposition:   FU with Emanuel Dowson  Wise Fees 1 year  Signed, Vaniah Chambers Meredith Leeds, MD  12/14/2017 4:14 PM     [place] [address] [phone] (office) [phone] (fax)

## 2017-12-14 NOTE — Patient Instructions (Signed)
Medication Instructions:  Your physician recommends that you continue on your current medications as directed. Please refer to the Current Medication list given to you today.  *If you need a refill on your cardiac medications before your next appointment, please call your pharmacy*  Labwork: None ordered  Testing/Procedures: None ordered  Follow-Up: Remote monitoring is used to monitor your Pacemaker or ICD from home. This monitoring reduces the number of office visits required to check your device to one time per year. It allows [place] to keep an eye on the functioning of your device to ensure it is working properly. You are scheduled for a device check from home on 03/15/2018. You may send your transmission at any time that day. If you have a wireless device, the transmission will be sent automatically. After your physician reviews your transmission, you will receive a postcard with your next transmission date.  Your physician wants you to follow-up in: 1 year with Dr. Curt Bears.  You will receive a reminder letter in the mail two months in advance. If you don't receive a letter, please call our office to schedule the follow-up appointment.  Thank you for choosing [facility]!!   Trinidad Curet, RN [phone]

## 2018-01-03 NOTE — Telephone Encounter (Signed)
error 

## 2018-01-19 ENCOUNTER — Other Ambulatory Visit: Payer: Self-pay | Admitting: Cardiology

## 2018-01-19 MED ORDER — METOPROLOL TARTRATE 25 MG PO TABS: 25.0000 mg | ORAL_TABLET | Freq: Two times a day (BID) | ORAL | 2 refills | Status: DC

## 2018-01-19 NOTE — Telephone Encounter (Signed)
Please [facility] [address] they state we denied.Marland Kitchen

## 2018-03-07 ENCOUNTER — Ambulatory Visit (INDEPENDENT_AMBULATORY_CARE_PROVIDER_SITE_OTHER): Payer: Medicare Other | Admitting: Cardiology

## 2018-03-07 VITALS — BP 120/60 | HR 83 | Ht 70.0 in | Wt 251.2 lb

## 2018-03-07 DIAGNOSIS — I10 Essential (primary) hypertension: Secondary | ICD-10-CM | POA: Diagnosis not present

## 2018-03-07 DIAGNOSIS — I442 Atrioventricular block, complete: Secondary | ICD-10-CM

## 2018-03-07 DIAGNOSIS — I350 Nonrheumatic aortic (valve) stenosis: Secondary | ICD-10-CM | POA: Diagnosis not present

## 2018-03-07 DIAGNOSIS — Z950 Presence of cardiac pacemaker: Secondary | ICD-10-CM

## 2018-03-07 DIAGNOSIS — I480 Paroxysmal atrial fibrillation: Secondary | ICD-10-CM | POA: Diagnosis not present

## 2018-03-07 NOTE — Progress Notes (Signed)
Cardiology Office Note:    Date:  03/07/2018   ID:  Frederick [facility], DOB 07-29-1940, MRN [id number]  PCP:  Algis Greenhouse, MD  Cardiologist:  [name], MD    Referring MD: Algis Greenhouse, MD   Chief Complaint  Patient presents with  . Follow-up  I have some shortness of breath as well as chest pain  History of Present Illness:    Frederick Vasquez is a 77 y.o. male with moderate aortic stenosis last assessment was in July of this year mean gradient was 36.  It was assessed as moderate to severe of was difficult with his case is the fact that he does have some chronic lung condition he started having what appears to be more shortness of breath as well as some heaviness in the chest when he walks he said that he can walk on flat ground but when he goes up he will that is difficult.  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  Obviously concern is is possible that his aortic valve getting worse to the point that something need to be done about it.  I will ask you to repeat echocardiogram will recheck his aortic stenosis.  He may require a cardiac catheterization after that to look at his coronaries.  Until that time I told him not to exercise too hard.  Also told him if he develops dizziness passing out more chest pain or more shortness of breath he need to let me know immediately.  Past Medical History:  Diagnosis Date  . Atrial fibrillation ([place])    Xarelto  . Diastolic heart failure ([place])   . Hyperlipidemia   . Hypertension   . Impaired glucose tolerance   . Pacemaker     Past Surgical History:  Procedure Laterality Date  . CARDIAC CATHETERIZATION  2014   non-obs dz, done at [facility]  . CARDIOVERSION N/A 09/02/2017   Procedure: CARDIOVERSION;  Surgeon: Lelon Perla, MD;  Location: [facility];  Service: Cardiovascular;  Laterality: N/A;  . PACEMAKER IMPLANT  03/2012  . REPLACEMENT TOTAL KNEE Left   . VASECTOMY      Current  Medications: Current Meds  Medication Sig  . acetaminophen (TYLENOL) 500 MG tablet Take 1,000 mg by mouth every 6 (six) hours as needed for moderate pain or headache.  Marland Kitchen amiodarone (PACERONE) 200 MG tablet Take 1 tablet (200 mg total) by mouth daily.  . metoprolol tartrate (LOPRESSOR) 25 MG tablet Take 1 tablet (25 mg total) by mouth 2 (two) times daily.  . nitroGLYCERIN (NITROSTAT) 0.4 MG SL tablet Place 0.4 mg under the tongue every 5 (five) minutes as needed for chest pain.  . rivaroxaban (XARELTO) 20 MG TABS tablet Take 1 tablet (20 mg total) by mouth daily with supper.  . rosuvastatin (CRESTOR) 20 MG tablet Take 20 mg by mouth at bedtime.  . tamsulosin (FLOMAX) 0.4 MG CAPS capsule Take 1 capsule (0.4 mg total) by mouth daily after supper.     Allergies:   Allopurinol and Atorvastatin   Social History   Socioeconomic History  . Marital status: Married    Spouse name: Not on file  . Number of children: Not on file  . Years of education: Not on file  . Highest education level: Not on file  Occupational History  . Occupation: retired  Scientific laboratory technician  . Financial resource strain: Not on file  . Food insecurity:    Worry: Not on file  Inability: Not on file  . Transportation needs:    Medical: Not on file    Non-medical: Not on file  Tobacco Use  . Smoking status: Former Smoker    Last attempt to quit: 1978    Years since quitting: 41.8  . Smokeless tobacco: Never Used  Substance and Sexual Activity  . Alcohol use: No    Frequency: Never  . Drug use: No  . Sexual activity: Not on file  Lifestyle  . Physical activity:    Days per week: Not on file    Minutes per session: Not on file  . Stress: Not on file  Relationships  . Social connections:    Talks on phone: Not on file    Gets together: Not on file    Attends religious service: Not on file    Active member of club or organization: Not on file    Attends meetings of clubs or organizations: Not on file     Relationship status: Not on file  Other Topics Concern  . Not on file  Social History Narrative   Pt lives in [place]     Family History: The patient's family history includes CAD in his father; Colon cancer in his sister; Stroke in his father. ROS:   Please see the history of present illness.    All 14 point review of systems negative except as described per history of present illness  EKGs/Labs/Other Studies Reviewed:      Recent Labs: 05/14/2017: ALT 22 08/26/2017: BUN 15; Creatinine, Ser 0.99; Hemoglobin 12.8; Platelets 134; Potassium 4.4; Sodium 141  Recent Lipid Panel No results found for: CHOL, TRIG, HDL, CHOLHDL, VLDL, LDLCALC, LDLDIRECT  Physical Exam:    VS:  BP 120/60   Pulse 83   Ht 5\' 10"  (1.778 m)   Wt 251 lb 3.2 oz (113.9 kg)   SpO2 93%   BMI 36.04 kg/m     Wt Readings from Last 3 Encounters:  03/07/18 251 lb 3.2 oz (113.9 kg)  12/14/17 245 lb 12.8 oz (111.5 kg)  10/27/17 244 lb 6.4 oz (110.9 kg)     GEN:  Well nourished, well developed in no acute distress HEENT: Normal NECK: No JVD; No carotid bruits LYMPHATICS: No lymphadenopathy CARDIAC: RRR, systolic ejection murmur grade 3/6 best heard right upper portion of the sternum, no rubs, no gallops RESPIRATORY:  Clear to auscultation without rales, wheezing or rhonchi  ABDOMEN: Soft, non-tender, non-distended MUSCULOSKELETAL:  No edema; No deformity  SKIN: Warm and dry LOWER EXTREMITIES: no swelling NEUROLOGIC:  Alert and oriented x 3 PSYCHIATRIC:  Normal affect   ASSESSMENT:    1. Moderate aortic stenosis   2. PAF (paroxysmal atrial fibrillation) ([place])   3. Essential hypertension   4. AV block, complete (H[facility])   5. Presence of cardiac pacemaker    PLAN:    In order of problems listed above:  1. At least moderate aortic stenosis with last estimation July of this year.  Since symptoms appears to be getting worse I will ask him to have repeated echocardiogram. 2. Paroxysmal atrial fibrillation  he is anticoagulated with Xarelto which we will continue. 3. Essential hypertension blood pressure well controlled continue present management. 4. Complete heart block with the pacemaker.  To be followed by our EP clinic.   Medication Adjustments/Labs and Tests Ordered: Current medicines are reviewed at length with the patient today.  Concerns regarding medicines are outlined above.  No orders of the defined types were placed in this  encounter.  Medication changes: No orders of the defined types were placed in this encounter.   Signed, Park Liter, MD, [facility] 03/07/2018 1:39 PM    [facility]

## 2018-03-07 NOTE — Patient Instructions (Signed)
Medication Instructions:  Your physician recommends that you continue on your current medications as directed. Please refer to the Current Medication list given to you today.  If you need a refill on your cardiac medications before your next appointment, please call your pharmacy.   Lab work: None.  If you have labs (blood work) drawn today and your tests are completely normal, you will receive your results only by: . MyChart Message (if you have MyChart) OR . A paper copy in the mail If you have any lab test that is abnormal or we need to change your treatment, we will call you to review the results.  Testing/Procedures: Your physician has requested that you have an echocardiogram. Echocardiography is a painless test that uses sound waves to create images of your heart. It provides your doctor with information about the size and shape of your heart and how well your heart's chambers and valves are working. This procedure takes approximately one hour. There are no restrictions for this procedure.    Follow-Up: At [facility], you and your health needs are our priority.  As part of our continuing mission to provide you with exceptional heart care, we have created designated Provider Care Teams.  These Care Teams include your primary Cardiologist (physician) and Advanced Practice Providers (APPs -  Physician Assistants and Nurse Practitioners) who all work together to provide you with the care you need, when you need it. You will need a follow up appointment in 1 months.  Please call our office 2 months in advance to schedule this appointment.  You may see Robert Krasowski, MD or another member of our [facility] Provider Team in [place]: Brian Munley, MD . Rajan Revankar, MD  Any Other Special Instructions Will Be Listed Below (If Applicable).  Echocardiogram An echocardiogram, or echocardiography, uses sound waves (ultrasound) to produce an image of your heart. The echocardiogram is  simple, painless, obtained within a short period of time, and offers valuable information to your health care provider. The images from an echocardiogram can provide information such as:  Evidence of coronary artery disease (CAD).  Heart size.  Heart muscle function.  Heart valve function.  Aneurysm detection.  Evidence of a past heart attack.  Fluid buildup around the heart.  Heart muscle thickening.  Assess heart valve function.  Tell a health care provider about:  Any allergies you have.  All medicines you are taking, including vitamins, herbs, eye drops, creams, and over-the-counter medicines.  Any problems you or family members have had with anesthetic medicines.  Any blood disorders you have.  Any surgeries you have had.  Any medical conditions you have.  Whether you are pregnant or may be pregnant. What happens before the procedure? No special preparation is needed. Eat and drink normally. What happens during the procedure?  In order to produce an image of your heart, gel will be applied to your chest and a wand-like tool (transducer) will be moved over your chest. The gel will help transmit the sound waves from the transducer. The sound waves will harmlessly bounce off your heart to allow the heart images to be captured in real-time motion. These images will then be recorded.  You may need an IV to receive a medicine that improves the quality of the pictures. What happens after the procedure? You may return to your normal schedule including diet, activities, and medicines, unless your health care provider tells you otherwise. This information is not intended to replace advice given to you   by your health care provider. Make sure you discuss any questions you have with your health care provider. Document Released: 05/07/2000 Document Revised: 12/27/2015 Document Reviewed: 01/15/2013 Elsevier Interactive Patient Education  2017 [employer]vier Inc.    

## 2018-03-15 ENCOUNTER — Ambulatory Visit (INDEPENDENT_AMBULATORY_CARE_PROVIDER_SITE_OTHER): Payer: Medicare Other | Admitting: *Deleted

## 2018-03-15 DIAGNOSIS — I442 Atrioventricular block, complete: Secondary | ICD-10-CM | POA: Diagnosis not present

## 2018-03-15 NOTE — Progress Notes (Signed)
Remote pacemaker transmission.   

## 2018-03-20 ENCOUNTER — Ambulatory Visit (INDEPENDENT_AMBULATORY_CARE_PROVIDER_SITE_OTHER): Payer: Medicare Other

## 2018-03-20 DIAGNOSIS — I350 Nonrheumatic aortic (valve) stenosis: Secondary | ICD-10-CM | POA: Diagnosis not present

## 2018-03-20 NOTE — Progress Notes (Signed)
Complete echocardiogram has been performed.  Jimmy Jakylan Ron RDCS, RVT 

## 2018-03-23 ENCOUNTER — Encounter: Payer: Self-pay | Admitting: Cardiology

## 2018-03-23 ENCOUNTER — Ambulatory Visit (INDEPENDENT_AMBULATORY_CARE_PROVIDER_SITE_OTHER): Payer: Medicare Other | Admitting: Cardiology

## 2018-03-23 VITALS — BP 120/74 | HR 79 | Ht 70.0 in | Wt 249.9 lb

## 2018-03-23 DIAGNOSIS — I442 Atrioventricular block, complete: Secondary | ICD-10-CM

## 2018-03-23 DIAGNOSIS — I10 Essential (primary) hypertension: Secondary | ICD-10-CM | POA: Diagnosis not present

## 2018-03-23 DIAGNOSIS — Z950 Presence of cardiac pacemaker: Secondary | ICD-10-CM | POA: Diagnosis not present

## 2018-03-23 DIAGNOSIS — I350 Nonrheumatic aortic (valve) stenosis: Secondary | ICD-10-CM

## 2018-03-23 DIAGNOSIS — I5032 Chronic diastolic (congestive) heart failure: Secondary | ICD-10-CM

## 2018-03-23 DIAGNOSIS — I480 Paroxysmal atrial fibrillation: Secondary | ICD-10-CM

## 2018-03-23 DIAGNOSIS — Z7901 Long term (current) use of anticoagulants: Secondary | ICD-10-CM

## 2018-03-23 MED ORDER — ASPIRIN EC 81 MG PO TBEC: 81.0000 mg | DELAYED_RELEASE_TABLET | Freq: Every day | ORAL | 3 refills | Status: DC

## 2018-03-23 MED ORDER — AMIODARONE HCL 200 MG PO TABS: 200.0000 mg | ORAL_TABLET | Freq: Every day | ORAL | 3 refills | Status: DC

## 2018-03-23 NOTE — Progress Notes (Signed)
Cardiology Office Note:    Date:  03/23/2018   ID:  Frederick Vasquez, DOB Sep 26, 1940, MRN [id number]  PCP:  Algis Greenhouse, MD  Cardiologist:  [name], MD    Referring MD: Algis Greenhouse, MD   Chief Complaint  Patient presents with  . Follow up on Echo  I am here to discuss aortic stenosis  History of Present Illness:    Frederick Vasquez is a 77 y.o. male who came to me for regular follow-up just 1 month ago.  He started having new symptoms which includes exertional shortness of breath as well as some dizziness.  He does have history of moderate aortic stenosis echocardiogram has been done which showed significant aortic stenosis with mean gradient of 48 mmHg.  Area is 1.05 but with his symptomatology we need to consider aortic valve replacement.  I brought him today to the office with his wife as well as his daughter is in the room when asked.  We discussed the nature of the problem as well as potential options in terms of fixing the problem which includes TAVR or open aortic valve replacement.  We discussed different type of valve that can be used.  He will be scheduled to have cardiac catheterization first to assess his coronary arteries.  Also will verify degree of aortic valve stenosis however again based on his symptomatology as well as echocardiogram I think with dealing with significant valve.  I told him not to overexert himself until we do cardiac catheterization we will try to do at the beginning of next week.  I told him to let me know if he still having chest pain dizziness or unexplained shortness of breath.  Past Medical History:  Diagnosis Date  . Atrial fibrillation ([place])    Xarelto  . Diastolic heart failure ([place])   . Hyperlipidemia   . Hypertension   . Impaired glucose tolerance   . Pacemaker     Past Surgical History:  Procedure Laterality Date  . CARDIAC CATHETERIZATION  2014   non-obs dz, done at [facility]  . CARDIOVERSION N/A 09/02/2017   Procedure:  CARDIOVERSION;  Surgeon: Lelon Perla, MD;  Location: [facility] ENDOSCOPY;  Service: Cardiovascular;  Laterality: N/A;  . PACEMAKER IMPLANT  03/2012  . REPLACEMENT TOTAL KNEE Left   . VASECTOMY      Current Medications: Current Meds  Medication Sig  . acetaminophen (TYLENOL) 500 MG tablet Take 1,000 mg by mouth every 6 (six) hours as needed for moderate pain or headache.  Marland Kitchen amiodarone (PACERONE) 200 MG tablet Take 1 tablet (200 mg total) by mouth daily.  . metoprolol tartrate (LOPRESSOR) 25 MG tablet Take 1 tablet (25 mg total) by mouth 2 (two) times daily.  . nitroGLYCERIN (NITROSTAT) 0.4 MG SL tablet Place 0.4 mg under the tongue every 5 (five) minutes as needed for chest pain.  . rivaroxaban (XARELTO) 20 MG TABS tablet Take 1 tablet (20 mg total) by mouth daily with supper.  . rosuvastatin (CRESTOR) 20 MG tablet Take 20 mg by mouth at bedtime.  . tamsulosin (FLOMAX) 0.4 MG CAPS capsule Take 1 capsule (0.4 mg total) by mouth daily after supper.     Allergies:   Allopurinol and Atorvastatin   Social History   Socioeconomic History  . Marital status: Married    Spouse name: Not on file  . Number of children: Not on file  . Years of education: Not on file  . Highest education level: Not on file  Occupational History  . Occupation: retired  Scientific laboratory technician  . Financial resource strain: Not on file  . Food insecurity:    Worry: Not on file    Inability: Not on file  . Transportation needs:    Medical: Not on file    Non-medical: Not on file  Tobacco Use  . Smoking status: Former Smoker    Last attempt to quit: 1978    Years since quitting: 41.8  . Smokeless tobacco: Never Used  Substance and Sexual Activity  . Alcohol use: No    Frequency: Never  . Drug use: No  . Sexual activity: Not on file  Lifestyle  . Physical activity:    Days per week: Not on file    Minutes per session: Not on file  . Stress: Not on file  Relationships  . Social connections:    Talks on phone:  Not on file    Gets together: Not on file    Attends religious service: Not on file    Active member of club or organization: Not on file    Attends meetings of clubs or organizations: Not on file    Relationship status: Not on file  Other Topics Concern  . Not on file  Social History Narrative   Pt lives in [place]     Family History: The patient's family history includes CAD in his father; Colon cancer in his sister; Stroke in his father. ROS:   Please see the history of present illness.    All 14 point review of systems negative except as described per history of present illness  EKGs/Labs/Other Studies Reviewed:      Recent Labs: 05/14/2017: ALT 22 08/26/2017: BUN 15; Creatinine, Ser 0.99; Hemoglobin 12.8; Platelets 134; Potassium 4.4; Sodium 141  Recent Lipid Panel No results found for: CHOL, TRIG, HDL, CHOLHDL, VLDL, LDLCALC, LDLDIRECT  Physical Exam:    VS:  BP 120/74   Pulse 79   Ht 5\' 10"  (1.778 m)   Wt 249 lb 14.4 oz (113.4 kg)   SpO2 97%   BMI 35.86 kg/m     Wt Readings from Last 3 Encounters:  03/23/18 249 lb 14.4 oz (113.4 kg)  03/07/18 251 lb 3.2 oz (113.9 kg)  12/14/17 245 lb 12.8 oz (111.5 kg)     GEN:  Well nourished, well developed in no acute distress HEENT: Normal NECK: No JVD; No carotid bruits LYMPHATICS: No lymphadenopathy CARDIAC: RRR, systolic ejection murmur grade 2/6 best at the right upper portion of the sternum., no rubs, no gallops RESPIRATORY:  Clear to auscultation without rales, wheezing or rhonchi  ABDOMEN: Soft, non-tender, non-distended MUSCULOSKELETAL:  No edema; No deformity  SKIN: Warm and dry LOWER EXTREMITIES: no swelling NEUROLOGIC:  Alert and oriented x 3 PSYCHIATRIC:  Normal affect   ASSESSMENT:    1. AV block, complete (HCC)   2. Nonrheumatic aortic valve stenosis   3. Chronic diastolic CHF (congestive heart failure) ([place])   4. Essential hypertension   5. PAF (paroxysmal atrial fibrillation) ([place])   6. Chronic  anticoagulation   7. Presence of cardiac pacemaker    PLAN:    In order of problems listed above:  1. Complete heart block is being addressed with pacemaker.  Last interrogation on the December 14, 2017 showing normal functioning device.  Battery longevity which is 78 months left. 2. Nonrheumatic aortic stenosis which appears to be significant.  He will be scheduled to have cardiac catheterization and then discussion about potential options for  fixing the valve. 3. Chronic diastolic congestive heart failure appears to be hemodynamically compensated. 4. Essential hypertension blood pressure well controlled continue present management. 5. Paroxysmal atrial fibrillation.  He is being anticoagulated his chads 2 Vascor equals 3.  Xarelto 20 mg daily been given to him. 6. Chronic anticoagulation secondary to atrial fibrillation will continue.  Long discussion with patient as well as with family I explained the nature of the problem and the way to fix it they agree with the plan of proceeding with cardiac catheterization to look at the coronary arteries and reassess as well.  Because of comorbidities namely history of asbestosis he may be candidate for TAVR if there is no significant coronary artery disease.  I did talk about our options with potentially having hyper procedure with stenting and then TAVR.  We also talk about valve choices.  I explained to them cardiac catheterization including risks benefits as well as alternatives he is willing to proceed.   Medication Adjustments/Labs and Tests Ordered: Current medicines are reviewed at length with the patient today.  Concerns regarding medicines are outlined above.  No orders of the defined types were placed in this encounter.  Medication changes: No orders of the defined types were placed in this encounter.   Signed, Park Liter, MD, [facility] 03/23/2018 12:20 PM    [place]

## 2018-03-23 NOTE — Patient Instructions (Addendum)
Medication Instructions:  Your physician has recommended you make the following change in your medication:   STOP Xarelto on 03/26/18 START Aspirin 81 daily  mg on 03/26/18  If you need a refill on your cardiac medications before your next appointment, please call your pharmacy.   Lab work: Your physician recommends that you return for lab work today: CBC, BMP, and PT/INR  If you have labs (blood work) drawn today and your tests are completely normal, you will receive your results only by: Marland Kitchen MyChart Message (if you have MyChart) OR . A paper copy in the mail If you have any lab test that is abnormal or we need to change your treatment, we will call you to review the results.  Testing/Procedures: A chest x-ray takes a picture of the organs and structures inside the chest, including the heart, lungs, and blood vessels. This test can show several things, including, whether the heart is enlarges; whether fluid is building up in the lungs; and whether pacemaker / defibrillator leads are still in place.     [place] [address] Dept: [phone] Loc: [phone]  Frederick Vasquez  03/23/2018  You are scheduled for a Cardiac Catheterization on Tuesday, November 5 with Dr. Tamala Julian  1. Please arrive at the [facility] (Main Entrance A) at [facility]: [address] [place], [place] [address] at 7:00 AM (This time is two hours before your procedure to ensure your preparation). Free valet parking service is available.   Special note: Every effort is made to have your procedure done on time. Please understand that emergencies sometimes delay scheduled procedures.  2. Diet: Do not eat solid foods after midnight.  The patient may have clear liquids until 5am upon the day of the procedure.  3. Labs: You will need to have blood drawn today: CBC BMP 4. Medication instructions in  preparation for your procedure:  Stop taking Xarelto (Rivaroxaban) on Sunday, November 3.    On the morning of your procedure, take your Aspirin and any morning medicines NOT listed above.  You may use sips of water.  5. Plan for one night stay--bring personal belongings. 6. Bring a current list of your medications and current insurance cards. 7. You MUST have a responsible person to drive you home. 8. Someone MUST be with you the first 24 hours after you arrive home or your discharge will be delayed. 9. Please wear clothes that are easy to get on and off and wear slip-on shoes.  Thank you for allowing [place] to care for you!   -- [facility]   Follow-Up: At [facility], you and your health needs are our priority.  As part of our continuing mission to provide you with exceptional heart care, we have created designated Provider Care Teams.  These Care Teams include your primary Cardiologist (physician) and Advanced Practice Providers (APPs -  Physician Assistants and Nurse Practitioners) who all work together to provide you with the care you need, when you need it. You will need a follow up appointment in 1 months.  Please call our office 2 months in advance to schedule this appointment.  You may see [name], MD or another member of our [place] Provider Team in [place]: Shirlee More, MD . Jyl Heinz, MD  Any Other Special Instructions Will Be Listed Below (If Applicable).    Aspirin and Your Heart Aspirin is  a medicine that affects the way blood clots. Aspirin can be used to help reduce the risk of blood clots, heart attacks, and other heart-related problems. Should I take aspirin? Your health care provider will help you determine whether it is safe and beneficial for you to take aspirin daily. Taking aspirin daily may be beneficial if you:  Have had a heart attack or chest pain.  Have undergone open heart surgery such as coronary  artery bypass surgery (CABG).  Have had coronary angioplasty.  Have experienced a stroke or transient ischemic attack (TIA).  Have peripheral vascular disease (PVD).  Have chronic heart rhythm problems such as atrial fibrillation.  Are there any risks of taking aspirin daily? Daily use of aspirin can increase your risk of side effects. Some of these include:  Bleeding. Bleeding problems can be minor or serious. An example of a minor problem is a cut that does not stop bleeding. An example of a more serious problem is stomach bleeding or bleeding into the brain. Your risk of bleeding is increased if you are also taking non-steroidal anti-inflammatory medicine (NSAIDs).  Increased bruising.  Upset stomach.  An allergic reaction. People who have nasal polyps have an increased risk of developing an aspirin allergy.  What are some guidelines I should follow when taking aspirin?  Take aspirin only as directed by your health care provider. Make sure you understand how much you should take and what form you should take. The two forms of aspirin are: ? Non-enteric-coated. This type of aspirin does not have a coating and is absorbed quickly. Non-enteric-coated aspirin is usually recommended for people with chest pain. This type of aspirin also comes in a chewable form. ? Enteric-coated. This type of aspirin has a special coating that releases the medicine very slowly. Enteric-coated aspirin causes less stomach upset than non-enteric-coated aspirin. This type of aspirin should not be chewed or crushed.  Drink alcohol in moderation. Drinking alcohol increases your risk of bleeding. When should I seek medical care?  You have unusual bleeding or bruising.  You have stomach pain.  You have an allergic reaction. Symptoms of an allergic reaction include: ? Hives. ? Itchy skin. ? Swelling of the lips, tongue, or face.  You have ringing in your ears. When should I seek immediate medical  care?  Your bowel movements are bloody, dark red, or black in color.  You vomit or cough up blood.  You have blood in your urine.  You cough, wheeze, or feel short of breath. If you have any of the following symptoms, this is an emergency. Do not wait to see if the pain will go away. Get medical help at once. Call your local emergency services (911 in the [place] Do not drive yourself to the hospital.  You have severe chest pain, especially if the pain is crushing or pressure-like and spreads to the arms, back, neck, or jaw.  You have stroke-like symptoms, such as: ? Loss of vision. ? Difficulty talking. ? Numbness or weakness on one side of your body. ? Numbness or weakness in your arm or leg. ? Not thinking clearly or feeling confused.  This information is not intended to replace advice given to you by your health care provider. Make sure you discuss any questions you have with your health care provider. Document Released: 04/22/2008 Document Revised: 09/17/2015 Document Reviewed: 08/15/2013 Elsevier Interactive Patient Education  2018 [place].    Coronary Angiogram With Stent Coronary angiogram with stent placement is a procedure  to widen or open a narrow blood vessel of the heart (coronary artery). Arteries may become blocked by cholesterol buildup (plaques) in the lining of the wall. When a coronary artery becomes partially blocked, blood flow to that area decreases. This may lead to chest pain or a heart attack (myocardial infarction). A stent is a small piece of metal that looks like mesh or a spring. Stent placement may be done as treatment for a heart attack or right after a coronary angiogram in which a blocked artery is found. Let your health care provider know about:  Any allergies you have.  All medicines you are taking, including vitamins, herbs, eye drops, creams, and over-the-counter medicines.  Any problems you or family members have had with anesthetic  medicines.  Any blood disorders you have.  Any surgeries you have had.  Any medical conditions you have.  Whether you are pregnant or may be pregnant. What are the risks? Generally, this is a safe procedure. However, problems may occur, including:  Damage to the heart or its blood vessels.  A return of blockage.  Bleeding, infection, or bruising at the insertion site.  A collection of blood under the skin (hematoma) at the insertion site.  A blood clot in another part of the body.  Kidney injury.  Allergic reaction to the dye or contrast that is used.  Bleeding into the abdomen (retroperitoneal bleeding).  What happens before the procedure? Staying hydrated Follow instructions from your health care provider about hydration, which may include:  Up to 2 hours before the procedure - you may continue to drink clear liquids, such as water, clear fruit juice, black coffee, and plain tea.  Eating and drinking restrictions Follow instructions from your health care provider about eating and drinking, which may include:  8 hours before the procedure - stop eating heavy meals or foods such as meat, fried foods, or fatty foods.  6 hours before the procedure - stop eating light meals or foods, such as toast or cereal.  2 hours before the procedure - stop drinking clear liquids.  Ask your health care provider about:  Changing or stopping your regular medicines. This is especially important if you are taking diabetes medicines or blood thinners.  Taking medicines such as ibuprofen. These medicines can thin your blood. Do not take these medicines before your procedure if your health care provider instructs you not to. Generally, aspirin is recommended before a procedure of passing a small, thin tube (catheter) through a blood vessel and into the heart (cardiac catheterization).  What happens during the procedure?  An IV tube will be inserted into one of your veins.  You will be  given one or more of the following: ? A medicine to help you relax (sedative). ? A medicine to numb the area where the catheter will be inserted into an artery (local anesthetic).  To reduce your risk of infection: ? Your health care team will wash or sanitize their hands. ? Your skin will be washed with soap. ? Hair may be removed from the area where the catheter will be inserted.  Using a guide wire, the catheter will be inserted into an artery. The location may be in your groin, in your wrist, or in the fold of your arm (near your elbow).  A type of X-ray (fluoroscopy) will be used to help guide the catheter to the opening of the arteries in the heart.  A dye will be injected into the catheter, and X-rays will  be taken. The dye will help to show where any narrowing or blockages are located in the arteries.  A tiny wire will be guided to the blocked spot, and a balloon will be inflated to make the artery wider.  The stent will be expanded and will crush the plaques into the wall of the vessel. The stent will hold the area open and improve the blood flow. Most stents have a drug coating to reduce the risk of the stent narrowing over time.  The artery may be made wider using a drill, laser, or other tools to remove plaques.  When the blood flow is better, the catheter will be removed. The lining of the artery will grow over the stent, which stays where it was placed. This procedure may vary among health care providers and hospitals. What happens after the procedure?  If the procedure is done through the leg, you will be kept in bed lying flat for about 6 hours. You will be instructed to not bend and not cross your legs.  The insertion site will be checked frequently.  The pulse in your foot or wrist will be checked frequently.  You may have additional blood tests, X-rays, and a test that records the electrical activity of your heart (electrocardiogram, or ECG). This information is not  intended to replace advice given to you by your health care provider. Make sure you discuss any questions you have with your health care provider. Document Released: 11/14/2002 Document Revised: 01/08/2016 Document Reviewed: 12/14/2015 Elsevier Interactive Patient Education  2018 [employer].   Chest X-Ray A chest X-ray is a painless test that uses radiation to create images of the structures inside of your chest. Chest X-rays are used to look for many health conditions, including heart failure, pneumonia, tuberculosis, rib fractures, breathing disorders, and cancer. They may be used to diagnose chest pain, constant coughing, or trouble breathing. Tell a health care provider about:  Any allergies you have.  All medicines you are taking, including vitamins, herbs, eye drops, creams, and over-the-counter medicines.  Any surgeries you have had.  Any medical conditions you have.  Whether you are pregnant or may be pregnant. What are the risks? Getting a chest X-ray is a safe procedure. However, you will be exposed to a small amount of radiation. Being exposed to too much radiation over a lifetime can increase the risk of cancer. This risk is small, but it may occur if you have many X-rays throughout your life. What happens before the procedure?  You may be asked to remove glasses, jewelry, and any other metal objects.  You will be asked to undress from the waist up. You may be given a hospital gown to wear.  You may be asked to wear a protective lead apron to protect parts of your body from radiation. What happens during the procedure?  You will be asked to stand still as each picture is taken to get the best possible images.  You will be asked to take a deep breath and hold your breath for a few seconds.  The X-ray machine will create a picture of your chest using a tiny burst of radiation. This is painless.  More pictures may be taken from other angles. Typically, one picture will  be taken while you face the X-ray camera, and another picture will be taken from the side while you stand. If you cannot stand, you may be asked to lie down. The procedure may vary among health care providers and  hospitals. What happens after the procedure?  The X-ray(s) will be reviewed by your health care provider or an X-ray (radiology) specialist.  It is up to you to get your test results. Ask your health care provider, or the department that is doing the test, when your results will be ready.  Your health care provider will tell you if you need more tests or a follow-up exam. Keep all follow-up visits as told by your health care provider. This is important. Summary  A chest X-ray is a safe, painless test that is used to examine the inside of the chest, heart, and lungs.  You will need to undress from the waist up and remove jewelry and metal objects before the procedure.  You will be exposed to a small amount of radiation during the procedure.  The X-ray machine will take one or more pictures of your chest while you remain as still as possible.  Later, a health care provider or specialist will review the test results with you. This information is not intended to replace advice given to you by your health care provider. Make sure you discuss any questions you have with your health care provider. Document Released: 07/06/2016 Document Revised: 07/06/2016 Document Reviewed: 07/06/2016 Elsevier Interactive Patient Education  [employer].

## 2018-03-23 NOTE — H&P (View-Only) (Signed)
Cardiology Office Note:    Date:  03/23/2018   ID:  Frederick Vasquez, DOB 12-11-1940, MRN [id number]  PCP:  Algis Greenhouse, MD  Cardiologist:  [name], MD    Referring MD: Algis Greenhouse, MD   Chief Complaint  Patient presents with  . Follow up on Echo  I am here to discuss aortic stenosis  History of Present Illness:    Frederick Vasquez is a 77 y.o. male who came to me for regular follow-up just 1 month ago.  He started having new symptoms which includes exertional shortness of breath as well as some dizziness.  He does have history of moderate aortic stenosis echocardiogram has been done which showed significant aortic stenosis with mean gradient of 48 mmHg.  Area is 1.05 but with his symptomatology we need to consider aortic valve replacement.  I brought him today to the office with his wife as well as his daughter is in the room when asked.  We discussed the nature of the problem as well as potential options in terms of fixing the problem which includes TAVR or open aortic valve replacement.  We discussed different type of valve that can be used.  He will be scheduled to have cardiac catheterization first to assess his coronary arteries.  Also will verify degree of aortic valve stenosis however again based on his symptomatology as well as echocardiogram I think with dealing with significant valve.  I told him not to overexert himself until we do cardiac catheterization we will try to do at the beginning of next week.  I told him to let me know if he still having chest pain dizziness or unexplained shortness of breath.  Past Medical History:  Diagnosis Date  . Atrial fibrillation ([place])    Xarelto  . Diastolic heart failure ([place])   . Hyperlipidemia   . Hypertension   . Impaired glucose tolerance   . Pacemaker     Past Surgical History:  Procedure Laterality Date  . CARDIAC CATHETERIZATION  2014   non-obs dz, done at [facility]  . CARDIOVERSION N/A 09/02/2017   Procedure:  CARDIOVERSION;  Surgeon: Lelon Perla, MD;  Location[facility] ENDOSCOPY;  Service: Cardiovascular;  Laterality: N/A;  . PACEMAKER IMPLANT  03/2012  . REPLACEMENT TOTAL KNEE Left   . VASECTOMY      Current Medications: Current Meds  Medication Sig  . acetaminophen (TYLENOL) 500 MG tablet Take 1,000 mg by mouth every 6 (six) hours as needed for moderate pain or headache.  Marland Kitchen amiodarone (PACERONE) 200 MG tablet Take 1 tablet (200 mg total) by mouth daily.  . metoprolol tartrate (LOPRESSOR) 25 MG tablet Take 1 tablet (25 mg total) by mouth 2 (two) times daily.  . nitroGLYCERIN (NITROSTAT) 0.4 MG SL tablet Place 0.4 mg under the tongue every 5 (five) minutes as needed for chest pain.  . rivaroxaban (XARELTO) 20 MG TABS tablet Take 1 tablet (20 mg total) by mouth daily with supper.  . rosuvastatin (CRESTOR) 20 MG tablet Take 20 mg by mouth at bedtime.  . tamsulosin (FLOMAX) 0.4 MG CAPS capsule Take 1 capsule (0.4 mg total) by mouth daily after supper.     Allergies:   Allopurinol and Atorvastatin   Social History   Socioeconomic History  . Marital status: Married    Spouse name: Not on file  . Number of children: Not on file  . Years of education: Not on file  . Highest education level: Not on file  Occupational History  . Occupation: retired  Scientific laboratory technician  . Financial resource strain: Not on file  . Food insecurity:    Worry: Not on file    Inability: Not on file  . Transportation needs:    Medical: Not on file    Non-medical: Not on file  Tobacco Use  . Smoking status: Former Smoker    Last attempt to quit: 1978    Years since quitting: 41.8  . Smokeless tobacco: Never Used  Substance and Sexual Activity  . Alcohol use: No    Frequency: Never  . Drug use: No  . Sexual activity: Not on file  Lifestyle  . Physical activity:    Days per week: Not on file    Minutes per session: Not on file  . Stress: Not on file  Relationships  . Social connections:    Talks on phone:  Not on file    Gets together: Not on file    Attends religious service: Not on file    Active member of club or organization: Not on file    Attends meetings of clubs or organizations: Not on file    Relationship status: Not on file  Other Topics Concern  . Not on file  Social History Narrative   Pt lives in [place]     Family History: The patient's family history includes CAD in his father; Colon cancer in his sister; Stroke in his father. ROS:   Please see the history of present illness.    All 14 point review of systems negative except as described per history of present illness  EKGs/Labs/Other Studies Reviewed:      Recent Labs: 05/14/2017: ALT 22 08/26/2017: BUN 15; Creatinine, Ser 0.99; Hemoglobin 12.8; Platelets 134; Potassium 4.4; Sodium 141  Recent Lipid Panel No results found for: CHOL, TRIG, HDL, CHOLHDL, VLDL, LDLCALC, LDLDIRECT  Physical Exam:    VS:  BP 120/74   Pulse 79   Ht 5\' 10"  (1.778 m)   Wt 249 lb 14.4 oz (113.4 kg)   SpO2 97%   BMI 35.86 kg/m     Wt Readings from Last 3 Encounters:  03/23/18 249 lb 14.4 oz (113.4 kg)  03/07/18 251 lb 3.2 oz (113.9 kg)  12/14/17 245 lb 12.8 oz (111.5 kg)     GEN:  Well nourished, well developed in no acute distress HEENT: Normal NECK: No JVD; No carotid bruits LYMPHATICS: No lymphadenopathy CARDIAC: RRR, systolic ejection murmur grade 2/6 best at the right upper portion of the sternum., no rubs, no gallops RESPIRATORY:  Clear to auscultation without rales, wheezing or rhonchi  ABDOMEN: Soft, non-tender, non-distended MUSCULOSKELETAL:  No edema; No deformity  SKIN: Warm and dry LOWER EXTREMITIES: no swelling NEUROLOGIC:  Alert and oriented x 3 PSYCHIATRIC:  Normal affect   ASSESSMENT:    1. AV block, complete (HCC)   2. Nonrheumatic aortic valve stenosis   3. Chronic diastolic CHF (congestive heart failure) ([place])   4. Essential hypertension   5. PAF (paroxysmal atrial fibrillation) ([place])   6. Chronic  anticoagulation   7. Presence of cardiac pacemaker    PLAN:    In order of problems listed above:  1. Complete heart block is being addressed with pacemaker.  Last interrogation on the December 14, 2017 showing normal functioning device.  Battery longevity which is 78 months left. 2. Nonrheumatic aortic stenosis which appears to be significant.  He will be scheduled to have cardiac catheterization and then discussion about potential options for  fixing the valve. 3. Chronic diastolic congestive heart failure appears to be hemodynamically compensated. 4. Essential hypertension blood pressure well controlled continue present management. 5. Paroxysmal atrial fibrillation.  He is being anticoagulated his chads 2 Vascor equals 3.  Xarelto 20 mg daily been given to him. 6. Chronic anticoagulation secondary to atrial fibrillation will continue.  Long discussion with patient as well as with family I explained the nature of the problem and the way to fix it they agree with the plan of proceeding with cardiac catheterization to look at the coronary arteries and reassess as well.  Because of comorbidities namely history of asbestosis he may be candidate for TAVR if there is no significant coronary artery disease.  I did talk about our options with potentially having hyper procedure with stenting and then TAVR.  We also talk about valve choices.  I explained to them cardiac catheterization including risks benefits as well as alternatives he is willing to proceed.   Medication Adjustments/Labs and Tests Ordered: Current medicines are reviewed at length with the patient today.  Concerns regarding medicines are outlined above.  No orders of the defined types were placed in this encounter.  Medication changes: No orders of the defined types were placed in this encounter.   Signed, Park Liter, MD, [facility] 03/23/2018 12:20 PM    [place]

## 2018-03-24 LAB — BASIC METABOLIC PANEL
BUN/Creatinine Ratio: 21 (ref 10–24)
BUN: 18 mg/dL (ref 8–27)
CHLORIDE: 104 mmol/L (ref 96–106)
CO2: 21 mmol/L (ref 20–29)
Calcium: 9 mg/dL (ref 8.6–10.2)
Creatinine, Ser: 0.84 mg/dL (ref 0.76–1.27)
GFR calc Af Amer: 98 mL/min/{1.73_m2} (ref >59)
GFR, EST NON AFRICAN AMERICAN: 85 mL/min/{1.73_m2} (ref >59)
GLUCOSE: 106 mg/dL — AB (ref 65–99)
POTASSIUM: 4.1 mmol/L (ref 3.5–5.2)
SODIUM: 140 mmol/L (ref 134–144)

## 2018-03-24 LAB — CBC
HEMOGLOBIN: 12.4 g/dL — AB (ref 13.0–17.7)
Hematocrit: 37 % — ABNORMAL LOW (ref 37.5–51.0)
MCH: 31.2 pg (ref 26.6–33.0)
MCHC: 33.5 g/dL (ref 31.5–35.7)
MCV: 93 fL (ref 79–97)
Platelets: 135 10*3/uL — ABNORMAL LOW (ref 150–450)
RBC: 3.97 x10E6/uL — AB (ref 4.14–5.80)
RDW: 13.8 % (ref 12.3–15.4)
WBC: 4.5 10*3/uL (ref 3.4–10.8)

## 2018-03-24 LAB — PROTIME-INR
INR: 1.2 (ref 0.8–1.2)
PROTHROMBIN TIME: 12.9 s — AB (ref 9.1–12.0)

## 2018-03-27 ENCOUNTER — Telehealth: Payer: Self-pay | Admitting: *Deleted

## 2018-03-27 NOTE — Telephone Encounter (Signed)
Pt contacted pre-catheterization scheduled at [facility] for: Tuesday March 28, 2018 9 AM Verified arrival time and place: [place] Entrance A at: 7 AM  No solid food after midnight prior to cath, clear liquids until 5 AM day of procedure. Contrast allergy: no  Hold: Xarelto-03/26/18 until post procedure.-last dose 03/25/18  Except hold medications AM meds can be  taken pre-cath with sip of water including: ASA 81 mg  Confirmed patient has responsible person to drive home post procedure and for 24 hours after you arrive home: yes

## 2018-03-28 ENCOUNTER — Encounter (HOSPITAL_COMMUNITY): Payer: Self-pay | Admitting: Interventional Cardiology

## 2018-03-28 ENCOUNTER — Other Ambulatory Visit: Payer: Self-pay

## 2018-03-28 ENCOUNTER — Encounter (HOSPITAL_COMMUNITY)
Admission: RE | Disposition: A | Discharge status: Home / Self Care | Payer: Self-pay | Source: Ambulatory Visit | Attending: Interventional Cardiology

## 2018-03-28 ENCOUNTER — Ambulatory Visit (HOSPITAL_COMMUNITY)
Admission: RE | Admit: 2018-03-28 | Discharge: 2018-03-28 | Disposition: A | Discharge status: Home / Self Care | Payer: Medicare Other | Source: Ambulatory Visit | Attending: Interventional Cardiology | Admitting: Interventional Cardiology

## 2018-03-28 DIAGNOSIS — Z87891 Personal history of nicotine dependence: Secondary | ICD-10-CM | POA: Diagnosis not present

## 2018-03-28 DIAGNOSIS — Z8249 Family history of ischemic heart disease and other diseases of the circulatory system: Secondary | ICD-10-CM | POA: Diagnosis not present

## 2018-03-28 DIAGNOSIS — Z9889 Other specified postprocedural states: Secondary | ICD-10-CM | POA: Insufficient documentation

## 2018-03-28 DIAGNOSIS — R7302 Impaired glucose tolerance (oral): Secondary | ICD-10-CM | POA: Insufficient documentation

## 2018-03-28 DIAGNOSIS — I350 Nonrheumatic aortic (valve) stenosis: Secondary | ICD-10-CM | POA: Diagnosis not present

## 2018-03-28 DIAGNOSIS — I2510 Atherosclerotic heart disease of native coronary artery without angina pectoris: Secondary | ICD-10-CM | POA: Insufficient documentation

## 2018-03-28 DIAGNOSIS — Z888 Allergy status to other drugs, medicaments and biological substances status: Secondary | ICD-10-CM | POA: Insufficient documentation

## 2018-03-28 DIAGNOSIS — Z96652 Presence of left artificial knee joint: Secondary | ICD-10-CM | POA: Insufficient documentation

## 2018-03-28 DIAGNOSIS — Z950 Presence of cardiac pacemaker: Secondary | ICD-10-CM | POA: Diagnosis not present

## 2018-03-28 DIAGNOSIS — E785 Hyperlipidemia, unspecified: Secondary | ICD-10-CM | POA: Insufficient documentation

## 2018-03-28 DIAGNOSIS — I442 Atrioventricular block, complete: Secondary | ICD-10-CM | POA: Diagnosis not present

## 2018-03-28 DIAGNOSIS — I110 Hypertensive heart disease with heart failure: Secondary | ICD-10-CM | POA: Diagnosis not present

## 2018-03-28 DIAGNOSIS — I2584 Coronary atherosclerosis due to calcified coronary lesion: Secondary | ICD-10-CM | POA: Insufficient documentation

## 2018-03-28 DIAGNOSIS — Z7901 Long term (current) use of anticoagulants: Secondary | ICD-10-CM | POA: Diagnosis not present

## 2018-03-28 DIAGNOSIS — R0609 Other forms of dyspnea: Secondary | ICD-10-CM | POA: Diagnosis present

## 2018-03-28 DIAGNOSIS — I480 Paroxysmal atrial fibrillation: Secondary | ICD-10-CM | POA: Diagnosis not present

## 2018-03-28 DIAGNOSIS — Z79899 Other long term (current) drug therapy: Secondary | ICD-10-CM | POA: Diagnosis not present

## 2018-03-28 DIAGNOSIS — Z9852 Vasectomy status: Secondary | ICD-10-CM | POA: Insufficient documentation

## 2018-03-28 DIAGNOSIS — I272 Pulmonary hypertension, unspecified: Secondary | ICD-10-CM | POA: Insufficient documentation

## 2018-03-28 DIAGNOSIS — I5032 Chronic diastolic (congestive) heart failure: Secondary | ICD-10-CM | POA: Diagnosis present

## 2018-03-28 DIAGNOSIS — Z823 Family history of stroke: Secondary | ICD-10-CM | POA: Diagnosis not present

## 2018-03-28 HISTORY — PX: RIGHT/LEFT HEART CATH AND CORONARY ANGIOGRAPHY: CATH118266

## 2018-03-28 LAB — POCT I-STAT 3, ART BLOOD GAS (G3+)
Acid-base deficit: 2 mmol/L (ref 0.0–2.0)
BICARBONATE: 23.2 mmol/L (ref 20.0–28.0)
O2 Saturation: 96 %
PCO2 ART: 40.2 mmHg (ref 32.0–48.0)
PH ART: 7.37 (ref 7.350–7.450)
TCO2: 24 mmol/L (ref 22–32)
pO2, Arterial: 87 mmHg (ref 83.0–108.0)

## 2018-03-28 LAB — POCT I-STAT 3, VENOUS BLOOD GAS (G3P V)
Acid-base deficit: 3 mmol/L — ABNORMAL HIGH (ref 0.0–2.0)
Acid-base deficit: 3 mmol/L — ABNORMAL HIGH (ref 0.0–2.0)
Bicarbonate: 22.4 mmol/L (ref 20.0–28.0)
Bicarbonate: 23.1 mmol/L (ref 20.0–28.0)
O2 SAT: 70 %
O2 Saturation: 67 %
PCO2 VEN: 43.1 mmHg — AB (ref 44.0–60.0)
PO2 VEN: 38 mmHg (ref 32.0–45.0)
PO2 VEN: 39 mmHg (ref 32.0–45.0)
TCO2: 24 mmol/L (ref 22–32)
TCO2: 24 mmol/L (ref 22–32)
pCO2, Ven: 42.4 mmHg — ABNORMAL LOW (ref 44.0–60.0)
pH, Ven: 7.324 (ref 7.250–7.430)
pH, Ven: 7.344 (ref 7.250–7.430)

## 2018-03-28 SURGERY — RIGHT/LEFT HEART CATH AND CORONARY ANGIOGRAPHY
Anesthesia: LOCAL

## 2018-03-28 MED ORDER — VERAPAMIL HCL 2.5 MG/ML IV SOLN
INTRAVENOUS | Status: DC | PRN
  Administered 2018-03-28: 10 mL via INTRA_ARTERIAL

## 2018-03-28 MED ORDER — SODIUM CHLORIDE 0.9 % IV SOLN: INTRAVENOUS | Status: DC

## 2018-03-28 MED ORDER — ASPIRIN 81 MG PO CHEW: 81.0000 mg | CHEWABLE_TABLET | ORAL | Status: DC

## 2018-03-28 MED ORDER — HEPARIN SODIUM (PORCINE) 1000 UNIT/ML IJ SOLN
INTRAMUSCULAR | Status: DC | PRN
  Administered 2018-03-28: 5000 [IU] via INTRAVENOUS

## 2018-03-28 MED ORDER — OXYCODONE HCL 5 MG PO TABS: 5.0000 mg | ORAL_TABLET | ORAL | Status: DC | PRN

## 2018-03-28 MED ORDER — SODIUM CHLORIDE 0.9 % IV SOLN: 250.0000 mL | INTRAVENOUS | Status: DC | PRN

## 2018-03-28 MED ORDER — MIDAZOLAM HCL 2 MG/2ML IJ SOLN
INTRAMUSCULAR | Status: AC
  Filled 2018-03-28: qty 2

## 2018-03-28 MED ORDER — LIDOCAINE HCL (PF) 1 % IJ SOLN
INTRAMUSCULAR | Status: DC | PRN
  Administered 2018-03-28 (×2): 2 mL via INTRADERMAL

## 2018-03-28 MED ORDER — SODIUM CHLORIDE 0.9% FLUSH: 3.0000 mL | Freq: Two times a day (BID) | INTRAVENOUS | Status: DC

## 2018-03-28 MED ORDER — SODIUM CHLORIDE 0.9% FLUSH: 3.0000 mL | INTRAVENOUS | Status: DC | PRN

## 2018-03-28 MED ORDER — FENTANYL CITRATE (PF) 100 MCG/2ML IJ SOLN
INTRAMUSCULAR | Status: AC
  Filled 2018-03-28: qty 2

## 2018-03-28 MED ORDER — MIDAZOLAM HCL 2 MG/2ML IJ SOLN
INTRAMUSCULAR | Status: DC | PRN
  Administered 2018-03-28: 0.5 mg via INTRAVENOUS
  Administered 2018-03-28: 1 mg via INTRAVENOUS

## 2018-03-28 MED ORDER — HEPARIN (PORCINE) IN NACL 1000-0.9 UT/500ML-% IV SOLN
INTRAVENOUS | Status: AC
  Filled 2018-03-28: qty 1000

## 2018-03-28 MED ORDER — FENTANYL CITRATE (PF) 100 MCG/2ML IJ SOLN
INTRAMUSCULAR | Status: DC | PRN
  Administered 2018-03-28 (×2): 25 ug via INTRAVENOUS

## 2018-03-28 MED ORDER — HEPARIN (PORCINE) IN NACL 1000-0.9 UT/500ML-% IV SOLN
INTRAVENOUS | Status: DC | PRN
  Administered 2018-03-28 (×2): 500 mL

## 2018-03-28 MED ORDER — ONDANSETRON HCL 4 MG/2ML IJ SOLN: 4.0000 mg | Freq: Four times a day (QID) | INTRAMUSCULAR | Status: DC | PRN

## 2018-03-28 MED ORDER — VERAPAMIL HCL 2.5 MG/ML IV SOLN
INTRAVENOUS | Status: AC
  Filled 2018-03-28: qty 2

## 2018-03-28 MED ORDER — LIDOCAINE HCL (PF) 1 % IJ SOLN
INTRAMUSCULAR | Status: AC
  Filled 2018-03-28: qty 30

## 2018-03-28 MED ORDER — IOHEXOL 350 MG/ML SOLN
INTRAVENOUS | Status: DC | PRN
  Administered 2018-03-28: 60 mL via INTRAVENOUS

## 2018-03-28 MED ORDER — SODIUM CHLORIDE 0.9 % WEIGHT BASED INFUSION: 1.0000 mL/kg/h | INTRAVENOUS | Status: DC

## 2018-03-28 MED ORDER — HEPARIN SODIUM (PORCINE) 1000 UNIT/ML IJ SOLN
INTRAMUSCULAR | Status: AC
  Filled 2018-03-28: qty 1

## 2018-03-28 MED ORDER — SODIUM CHLORIDE 0.9 % WEIGHT BASED INFUSION
3.0000 mL/kg/h | INTRAVENOUS | Status: AC
  Administered 2018-03-28: 3 mL/kg/h via INTRAVENOUS

## 2018-03-28 MED ORDER — ACETAMINOPHEN 325 MG PO TABS: 650.0000 mg | ORAL_TABLET | ORAL | Status: DC | PRN

## 2018-03-28 SURGICAL SUPPLY — 16 items
CATH 5FR JL3.5 JR4 ANG PIG MP (CATHETERS) ×2 IMPLANT
CATH BALLN WEDGE 5F 110CM (CATHETERS) ×1 IMPLANT
CATH LAUNCHER 5F RADR (CATHETERS) IMPLANT
CATHETER LAUNCHER 5F RADR (CATHETERS) ×2
DEVICE RAD COMP TR BAND LRG (VASCULAR PRODUCTS) ×1 IMPLANT
GLIDESHEATH SLEND A-KIT 6F 22G (SHEATH) ×2 IMPLANT
GUIDEWIRE INQWIRE 1.5J.035X260 (WIRE) IMPLANT
INQWIRE 1.5J .035X260CM (WIRE) ×2
KIT HEART LEFT (KITS) ×2 IMPLANT
PACK CARDIAC CATHETERIZATION (CUSTOM PROCEDURE TRAY) ×2 IMPLANT
SHEATH GLIDE SLENDER 4/5FR (SHEATH) ×2 IMPLANT
SHEATH PROBE COVER 6X72 (BAG) ×1 IMPLANT
TRANSDUCER W/STOPCOCK (MISCELLANEOUS) ×2 IMPLANT
TUBING CIL FLEX 10 FLL-RA (TUBING) ×2 IMPLANT
WIRE EMERALD ST .035X260CM (WIRE) ×1 IMPLANT
WIRE HI TORQ VERSACORE-J 145CM (WIRE) ×1 IMPLANT

## 2018-03-28 NOTE — CV Procedure (Signed)
   Left and right heart catheterization via right arm antecubital and brachial vein and artery respectively.  Moderate aortic stenosis by hemodynamics.  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  90% ostial diagonal 1 and 80% mid diagonal 2.  Heavy aortic valve calcification.  Heavy mitral valve calcification.  Normal right heart pressures.  No complications to this point.

## 2018-03-28 NOTE — Interval H&P Note (Signed)
Cath Lab Visit (complete for each Cath Lab visit)  Clinical Evaluation Leading to the Procedure:   ACS: No.  Non-ACS:    Anginal Classification: CCS II  Anti-ischemic medical therapy: No Therapy  Non-Invasive Test Results: No non-invasive testing performed  Prior CABG: No previous CABG      History and Physical Interval Note:  03/28/2018 8:55 AM  Alen Bleacher  has presented today for surgery, with the diagnosis of cp  The various methods of treatment have been discussed with the patient and family. After consideration of risks, benefits and other options for treatment, the patient has consented to  Procedure(s): RIGHT/LEFT HEART CATH AND CORONARY ANGIOGRAPHY (N/A) as a surgical intervention .  The patient's history has been reviewed, patient examined, no change in status, stable for surgery.  I have reviewed the patient's chart and labs.  Questions were answered to the patient's satisfaction.     Belva Crome III

## 2018-03-28 NOTE — CV Procedure (Deleted)
Cath Lab Visit (complete for each Cath Lab visit)  Clinical Evaluation Leading to the Procedure:   ACS: No.  Non-ACS:    Anginal Classification: CCS II  Anti-ischemic medical therapy: No Therapy  Non-Invasive Test Results: No non-invasive testing performed  Prior CABG: No previous CABG

## 2018-03-28 NOTE — Discharge Instructions (Signed)

## 2018-03-31 ENCOUNTER — Telehealth: Payer: Self-pay | Admitting: Emergency Medicine

## 2018-03-31 DIAGNOSIS — I350 Nonrheumatic aortic (valve) stenosis: Secondary | ICD-10-CM

## 2018-03-31 NOTE — Telephone Encounter (Signed)
Informed patient of cardiac catheterization results and Dr. Wendy Poet referral to Dr. Burt Knack for possible TAVR. Patient verbally understands. Referral placed. Patient verbally understands

## 2018-04-06 ENCOUNTER — Other Ambulatory Visit: Payer: Self-pay | Admitting: Physician Assistant

## 2018-04-06 DIAGNOSIS — Z952 Presence of prosthetic heart valve: Secondary | ICD-10-CM

## 2018-04-06 DIAGNOSIS — I350 Nonrheumatic aortic (valve) stenosis: Secondary | ICD-10-CM

## 2018-04-06 LAB — CUP PACEART REMOTE DEVICE CHECK
Battery Remaining Longevity: 70 mo
Battery Voltage: 2.79 V
Brady Statistic AP VS Percent: 0 %
Date Time Interrogation Session: 20191023130709
Implantable Lead Implant Date: 20131113
Implantable Lead Location: 753859
Implantable Lead Location: 753860
Implantable Pulse Generator Implant Date: 20131113
Lead Channel Pacing Threshold Amplitude: 0.5 V
Lead Channel Pacing Threshold Amplitude: 0.75 V
Lead Channel Pacing Threshold Pulse Width: 0.4 ms
Lead Channel Setting Pacing Amplitude: 2 V
Lead Channel Setting Pacing Amplitude: 2.5 V
Lead Channel Setting Pacing Pulse Width: 0.4 ms
MDC IDC LEAD IMPLANT DT: 20131113
MDC IDC MSMT BATTERY IMPEDANCE: 649 Ohm
MDC IDC MSMT LEADCHNL RA IMPEDANCE VALUE: 392 Ohm
MDC IDC MSMT LEADCHNL RA PACING THRESHOLD PULSEWIDTH: 0.4 ms
MDC IDC MSMT LEADCHNL RA SENSING INTR AMPL: 2.8 mV
MDC IDC MSMT LEADCHNL RV IMPEDANCE VALUE: 637 Ohm
MDC IDC SET LEADCHNL RV SENSING SENSITIVITY: 4 mV
MDC IDC STAT BRADY AP VP PERCENT: 71 %
MDC IDC STAT BRADY AS VP PERCENT: 29 %
MDC IDC STAT BRADY AS VS PERCENT: 0 %

## 2018-04-07 ENCOUNTER — Encounter: Payer: Self-pay | Admitting: Physician Assistant

## 2018-04-11 ENCOUNTER — Ambulatory Visit: Payer: Medicare Other | Admitting: Cardiology

## 2018-04-12 ENCOUNTER — Encounter: Payer: Self-pay | Admitting: Cardiovascular Disease

## 2018-04-12 ENCOUNTER — Ambulatory Visit (INDEPENDENT_AMBULATORY_CARE_PROVIDER_SITE_OTHER): Payer: Medicare Other | Admitting: Cardiovascular Disease

## 2018-04-12 VITALS — BP 138/88 | HR 74 | Ht 70.0 in | Wt 249.8 lb

## 2018-04-12 DIAGNOSIS — I350 Nonrheumatic aortic (valve) stenosis: Secondary | ICD-10-CM | POA: Diagnosis not present

## 2018-04-12 NOTE — Patient Instructions (Signed)
Please see attached instruction sheet for your upcoming appointments!

## 2018-04-12 NOTE — Progress Notes (Addendum)
Cardiology Office Note:    Date:  04/12/2018   ID:  Frederick Vasquez, DOB March 29, 1941, MRN [id number]  PCP:  Algis Greenhouse, MD  Cardiologist:  [name], MD  Electrophysiologist:  None   Referring MD: Algis Greenhouse, MD   Chief Complaint  Patient presents with  . Shortness of Breath    History of Present Illness:    Frederick Vasquez is a 77 y.o. male presenting for evaluation of severe symptomatic aortic stenosis.  The patient is here with his wife and his son today.  He has been followed closely by Dr. Agustin Cree over the years.  The patient has been followed for moderate aortic stenosis for several years with recent progression noted by echo criteria.  The patient has developed progressive shortness of breath with activity.  Symptoms have been present now for greater than 12 months, but he has noted worsening in his shortness of breath over this time period.  He has not had symptoms of leg swelling, orthopnea, or PND.  He denies exertional lightheadedness or syncope.  He has had a feeling of tightness in his chest on a few occasions.  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  The patient also has a history of complete heart block and he underwent permanent pacemaker implantation in 2013.  He is chronically anticoagulated for paroxysmal atrial fibrillation and he has been AV sequentially paced on most recent EKG tracings.  Past Medical History:  Diagnosis Date  . Atrial fibrillation ([place])    Xarelto  . Diastolic heart failure ([place])   . Hyperlipidemia   . Hypertension   . Impaired glucose tolerance   . Pacemaker     Past Surgical History:  Procedure Laterality Date  . CARDIAC CATHETERIZATION  2014   non-obs dz, done at [facility]  . CARDIOVERSION N/A 09/02/2017   Procedure: CARDIOVERSION;  Surgeon: Lelon Perla, MD;  Location: [facility] ENDOSCOPY;  Service: Cardiovascular;  Laterality: N/A;  .  PACEMAKER IMPLANT  03/2012  . REPLACEMENT TOTAL KNEE Left   . RIGHT/LEFT HEART CATH AND CORONARY ANGIOGRAPHY N/A 03/28/2018   Procedure: RIGHT/LEFT HEART CATH AND CORONARY ANGIOGRAPHY;  Surgeon: Belva Crome, MD;  Location: [facility] CV LAB;  Service: Cardiovascular;  Laterality: N/A;  . VASECTOMY      Current Medications: Current Meds  Medication Sig  . acetaminophen (TYLENOL) 500 MG tablet Take 1,000 mg by mouth every 6 (six) hours as needed for moderate pain or headache.  Marland Kitchen amiodarone (PACERONE) 200 MG tablet Take 1 tablet (200 mg total) by mouth daily.  . metoprolol tartrate (LOPRESSOR) 25 MG tablet Take 1 tablet (25 mg total) by mouth 2 (two) times daily.  . nitroGLYCERIN (NITROSTAT) 0.4 MG SL tablet Place 0.4 mg under the tongue every 5 (five) minutes as needed for chest pain.  . rivaroxaban (XARELTO) 20 MG TABS tablet Take 1 tablet (20 mg total) by mouth daily with supper.  . rosuvastatin (CRESTOR) 20 MG tablet Take 20 mg by mouth at bedtime.  . tamsulosin (FLOMAX) 0.4 MG CAPS capsule Take 1 capsule (0.4 mg total) by mouth daily after supper.     Allergies:   Allopurinol and Atorvastatin   Social History   Socioeconomic History  . Marital status: Married    Spouse name: Not on file  . Number of children: Not on file  . Years of education: Not on file  .  Highest education level: Not on file  Occupational History  . Occupation: retired  Scientific laboratory technician  . Financial resource strain: Not on file  . Food insecurity:    Worry: Not on file    Inability: Not on file  . Transportation needs:    Medical: Not on file    Non-medical: Not on file  Tobacco Use  . Smoking status: Former Smoker    Last attempt to quit: 1978    Years since quitting: 41.9  . Smokeless tobacco: Never Used  Substance and Sexual Activity  . Alcohol use: No    Frequency: Never  . Drug use: No  . Sexual activity: Not on file  Lifestyle  . Physical activity:    Days per week: Not on file    Minutes  per session: Not on file  . Stress: Not on file  Relationships  . Social connections:    Talks on phone: Not on file    Gets together: Not on file    Attends religious service: Not on file    Active member of club or organization: Not on file    Attends meetings of clubs or organizations: Not on file    Relationship status: Not on file  Other Topics Concern  . Not on file  Social History Narrative   Pt lives in [place]     Family History: The patient's family history includes CAD in his father; Colon cancer in his sister; Stroke in his father.  ROS:   Please see the history of present illness.    Positive for easy bruising.  All other systems reviewed and are negative.  EKGs/Labs/Other Studies Reviewed:    The following studies were reviewed today: Cardiac Catheterization: Conclusion    Moderately severe calcific aortic stenosis.  Peak to peak gradient 45 mmHg, mean gradient 30 mmHg, and calculated aortic valve area 1.3 cm based upon a cardiac output of 6.6 L/min/index 2.92 L/min.  Moderate proximal RCA and proximal to mid LAD bulky calcification with plaque up to 60%.  Second diagonal contains 70% stenosis but is a small vessel and management should be with medical therapy.  Calcified left main but no significant obstruction.  Circumflex is widely patent.  Left ventriculography was not performed.  LVEDP was 20.  Mild pulmonary hypertension with PA systolic pressure of 45 mmHg and mean pulmonary capillary wedge pressure 14 mmHg.  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  RECOMMENDATIONS:   Probably a reasonable candidate for TAVR for moderately severe aortic stenosis.  Dyspnea on exertion is his major complaint and there is likely contribution from chronic lung disease/asbestosis.  Valve treatment may not completely resolve dyspnea on exertion.  Recommend Aspirin 81mg  daily for moderate CAD.  Indications   DOE (dyspnea on exertion)  [R06.09 (ICD-10-CM)]  Aortic stenosis, severe [I35.0 (ICD-10-CM)]  Procedural Details/Technique   Technical Details The right radial area was sterilely prepped and draped. Intravenous sedation with Versed and fentanyl was administered. 1% Xylocaine was infiltrated to achieve local analgesia. Using real-time vascular ultrasound, a double wall stick with an angiocath was utilized to obtain intra-arterial access. A VUS image was saved for the permanent record.The modified Seldinger technique was used to place a 69F " Slender" sheath in the right radial artery. Weight based heparin was administered. Coronary angiography was done using 5 F catheters. Right and left coronary angiography was performed with a JR4. Additional right coronary images were obtained with an EZ-Rad right 5 French coronary guide catheter. Left ventricular hemodymic  recordings were made using the Denali Rad right catheter. Left ventriculography was not performed.  Fluoroscopy demonstrated severe calcification of the right hemidiaphragm as well as right pleural calcification potentially consistent with asbestosis.  Right heart catheterization was performed by exchanging a previously placed antecubital IV angio-cath for a 5 French Slender sheath. 1% Xylocaine was used to locally nesthetize the area around the IV site. The IV catheter was wired using an .018 guidewire. The modified Seldinger technique was used to place the 5 [place] sheath. Double glove technique was used to enhance sterility. After sheath insertion, right heart cath was performed using a 5 French balloon tipped catheter and fluoroscopic guidance. Pressures were recorded in each chamber and in the pulmonary capillary wedge position.. The main pulmonary artery O2 saturation was sampled.   Hemostasis was achieved using a pneumatic band.  During this procedure the patient is administered a total of Versed 1.5 mg and Fentanyl 50 mg to achieve and maintain moderate conscious sedation.  The patient's heart rate, blood pressure, and oxygen saturation are monitored continuously during the procedure. The period of conscious sedation is 56 minutes, of which I was present face-to-face 100% of this time.   Estimated blood loss <50 mL.  During this procedure the patient was administered the following to achieve and maintain moderate conscious sedation: Versed 1.5 mg, Fentanyl 50 mcg, while the patient's heart rate, blood pressure, and oxygen saturation were continuously monitored. The period of conscious sedation was 56 minutes, of which I was present face-to-face 100% of this time.  Coronary Findings   Diagnostic  Dominance: Right  Left Anterior Descending  Prox LAD lesion 60% stenosed  Prox LAD lesion is 60% stenosed.  First Diagonal Branch  Ost 1st Diag lesion 40% stenosed  Ost 1st Diag lesion is 40% stenosed.  Second Diagonal Branch  2nd Diag lesion 80% stenosed  2nd Diag lesion is 80% stenosed.  Left Circumflex  First Obtuse Marginal Branch  Vessel is large in size.  Right Coronary Artery  Prox RCA lesion 50% stenosed  Prox RCA lesion is 50% stenosed.  Intervention   No interventions have been documented.  Right Heart   Right Heart Pressures Hemodynamic findings consistent with pulmonary hypertension. Elevated LV EDP consistent with volume overload.  Right Atrium Right atrial pressure is normal.  Left Heart   Aortic Valve There is moderate aortic valve stenosis. The aortic valve is calcified. There is restricted aortic valve motion.  Coronary Diagrams   Diagnostic Diagram        Echo: Study Conclusions  - Left ventricle: The cavity size was normal. Wall thickness was   increased in a pattern of moderate LVH. Systolic function was   normal. The estimated ejection fraction was in the range of 55%   to 60%. Wall motion was normal; there were no regional wall   motion abnormalities. Features are consistent with a pseudonormal   left ventricular filling  pattern, with concomitant abnormal   relaxation and increased filling pressure (grade 2 diastolic   dysfunction). - Ventricular septum: The outflow septum had a sigmoid appearance. - Aortic valve: There was severe stenosis. Valve area (VTI): 1.05   cm^2. Valve area (Vmax): 1.16 cm^2. Valve area (Vmean): 1.04   cm^2. - Mitral valve: Calcified annulus. There was mild regurgitation.   Valve area by pressure half-time: 2.39 cm^2. Valve area by   continuity equation (using LVOT flow): 2.19 cm^2. - Left atrium: The atrium was severely dilated.  Impressions:  - Normal LVEF.  Moderate LVH.   Severe AS.   Mild MR, trace TR.   Severe LAE.   Pseudonormal transmitral flow pattern.  ------------------------------------------------------------------- Labs, prior tests, procedures, and surgery: Permanent pacemaker system implantation.  ------------------------------------------------------------------- Study data:  No prior study was available for comparison.  Study status:  Routine.  Procedure:  The patient reported no pain pre or post test. Transthoracic echocardiography. Image quality was adequate.  Study completion:  There were no complications. Transthoracic echocardiography.  M-mode, complete 2D, spectral Doppler, and color Doppler.  Birthdate:  Patient birthdate: 02-13-1941.  Age:  Patient is 77 yr old.  Sex:  Gender: male. BMI: 36 kg/m^2.  Blood pressure:     136/64  Patient status: Outpatient.  Study date:  Study date: 03/20/2018. Study time: 09:23 AM.  Location:  Echo laboratory.  -------------------------------------------------------------------  ------------------------------------------------------------------- Left ventricle:  The cavity size was normal. Wall thickness was increased in a pattern of moderate LVH. Systolic function was normal. The estimated ejection fraction was in the range of 55% to 60%. Wall motion was normal; there were no regional wall  motion abnormalities. Features are consistent with a pseudonormal left ventricular filling pattern, with concomitant abnormal relaxation and increased filling pressure (grade 2 diastolic dysfunction).  ------------------------------------------------------------------- Aortic valve:   Trileaflet; severely thickened, severely calcified leaflets. Mobility was not restricted.  Doppler:   There was severe stenosis.   There was no regurgitation.    VTI ratio of LVOT to aortic valve: 0.3. Valve area (VTI): 1.05 cm^2. Indexed valve area (VTI): 0.43 cm^2/m^2. Peak velocity ratio of LVOT to aortic valve: 0.33. Valve area (Vmax): 1.16 cm^2. Indexed valve area (Vmax): 0.48 cm^2/m^2. Mean velocity ratio of LVOT to aortic valve: 0.3. Valve area (Vmean): 1.04 cm^2. Indexed valve area (Vmean): 0.43 cm^2/m^2.    Mean gradient (S): 48 mm Hg. Peak gradient (S): 75 mm Hg.  ------------------------------------------------------------------- Aorta:  Aortic root: The aortic root was normal in size.  ------------------------------------------------------------------- Mitral valve:   Calcified annulus. Mobility was not restricted. Doppler:  Transvalvular velocity was within the normal range. There was no evidence for stenosis. There was mild regurgitation. Valve area by pressure half-time: 2.39 cm^2. Indexed valve area by pressure half-time: 0.99 cm^2/m^2. Valve area by continuity equation (using LVOT flow): 2.19 cm^2. Indexed valve area by continuity equation (using LVOT flow): 0.91 cm^2/m^2.    Mean gradient (D): 4 mm Hg. Peak gradient (D): 7 mm Hg.  ------------------------------------------------------------------- Left atrium:  The atrium was severely dilated.  ------------------------------------------------------------------- Right ventricle:  The cavity size was normal. Wall thickness was normal. Systolic function was  normal.  ------------------------------------------------------------------- Ventricular septum:    The outflow septum had a sigmoid appearance.   ------------------------------------------------------------------- Pulmonic valve:    Structurally normal valve.   Cusp separation was normal.  Doppler:  Transvalvular velocity was within the normal range. There was no evidence for stenosis. There was trivial regurgitation.  ------------------------------------------------------------------- Tricuspid valve:   Structurally normal valve.    Doppler: Transvalvular velocity was within the normal range. There was mild regurgitation.  ------------------------------------------------------------------- Pulmonary artery:   The main pulmonary artery was normal-sized. Systolic pressure was within the normal range.  ------------------------------------------------------------------- Right atrium:  The atrium was normal in size.  ------------------------------------------------------------------- Pericardium:  There was no pericardial effusion.  ------------------------------------------------------------------- Systemic veins: Inferior vena cava: The vessel was normal in size.   EKG:  EKG is not ordered today.    Recent Labs: 05/14/2017: ALT 22 03/23/2018: BUN 18; Creatinine, Ser 0.84; Hemoglobin 12.4; Platelets 135; Potassium 4.1; Sodium 140  Recent Lipid Panel No results found for: CHOL, TRIG, HDL, CHOLHDL, VLDL, LDLCALC, LDLDIRECT  Physical Exam:    VS:  BP 138/88   Pulse 74   Ht 5\' 10"  (1.778 m)   Wt 249 lb 12.8 oz (113.3 kg)   SpO2 94%   BMI 35.84 kg/m     Wt Readings from Last 3 Encounters:  04/12/18 249 lb 12.8 oz (113.3 kg)  03/28/18 245 lb (111.1 kg)  03/23/18 249 lb 14.4 oz (113.4 kg)     GEN: Well nourished, well developed in no acute distress HEENT: Normal NECK: No JVD; No carotid bruits LYMPHATICS: No lymphadenopathy CARDIAC: RRR, 3/6 harsh late peaking  systolic murmur at the RUSB RESPIRATORY:  Clear to auscultation without rales, wheezing or rhonchi  ABDOMEN: Soft, non-tender, non-distended MUSCULOSKELETAL:  No edema; No deformity  SKIN: Warm and dry NEUROLOGIC:  Alert and oriented x 3 PSYCHIATRIC:  Normal affect   STS Risk Calculator Procedure: Isolated AVR   Risk of Mortality: 2.108%  Renal Failure: 2.442%  Permanent Stroke: 0.695%  Prolonged Ventilation: 7.419%  DSW Infection: 0.241%  Reoperation: 3.683%  Morbidity or Mortality: 11.002%  Short Length of Stay: 39.635%  Long Length of Stay: 5.506%   ASSESSMENT:    1. Severe aortic stenosis    PLAN:    In order of problems listed above:  1. The patient has severe, stage D1 aortic stenosis. I have reviewed the natural history of aortic stenosis with the patient and their family members who are present today. We have discussed the limitations of medical therapy and the poor prognosis associated with symptomatic aortic stenosis. We have reviewed potential treatment options, including palliative medical therapy, conventional surgical aortic valve replacement, and transcatheter aortic valve replacement. We discussed treatment options in the context of the patient's specific comorbid medical conditions.  I have personally reviewed the patient's echo and cardiac catheterization images.  He has preserved LV systolic function.  He has very bulky calcification of both the aortic and mitral valves.  Doppler data does not show any evidence of significant mitral stenosis as the patient has a mean gradient of 4 mmHg.  There is calcification of both the mitral annulus and valve leaflets.  The patient clearly has severe aortic stenosis with a peak systolic velocity in excess of 4 m/s and mean transvalvular gradient of 48 mmHg.  Cardiac catheterization data reveals moderate calcific coronary artery disease in both the proximal RCA and mid LAD.  I do not think he has any severe flow-limiting stenoses.   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  He understands that further anatomic characterization with CT angiogram studies of the heart as well as the chest/abdomen/pelvis will need to be performed in order to assess anatomic suitability for TAVR.  He will then undergo formal cardiac surgical evaluation as part of a multidisciplinary approach to his care.  I discussed the pros and cons of both conventional heart surgery and transcatheter aortic valve replacement with the patient and his family today.  We specifically reviewed procedural expectations, typical recovery, and potential complications of TAVR.  All of their questions are answered.  He is given a schedule of CT scans, pulmonary function tests, carotid Doppler studies, and cardiac surgical consultation today.   Medication Adjustments/Labs and Tests Ordered: Current medicines are reviewed at length  with the patient today.  Concerns regarding medicines are outlined above.  Orders Placed This Encounter  Procedures  . Basic metabolic panel   No orders of the defined types were placed in this encounter.   Patient Instructions  Please see attached instruction sheet for your upcoming appointments!    Signed, Sherren Mocha, MD  04/12/2018 1:29 PM    [facility]

## 2018-04-13 ENCOUNTER — Ambulatory Visit: Payer: Medicare Other | Admitting: Cardiology

## 2018-04-13 LAB — BASIC METABOLIC PANEL
BUN/Creatinine Ratio: 17 (ref 10–24)
BUN: 18 mg/dL (ref 8–27)
CO2: 21 mmol/L (ref 20–29)
CREATININE: 1.03 mg/dL (ref 0.76–1.27)
Calcium: 8.9 mg/dL (ref 8.6–10.2)
Chloride: 101 mmol/L (ref 96–106)
GFR, EST AFRICAN AMERICAN: 81 mL/min/{1.73_m2} (ref >59)
GFR, EST NON AFRICAN AMERICAN: 70 mL/min/{1.73_m2} (ref >59)
Glucose: 91 mg/dL (ref 65–99)
POTASSIUM: 4.1 mmol/L (ref 3.5–5.2)
SODIUM: 140 mmol/L (ref 134–144)

## 2018-04-14 ENCOUNTER — Ambulatory Visit (HOSPITAL_COMMUNITY): Payer: Medicare Other

## 2018-04-14 ENCOUNTER — Telehealth: Payer: Self-pay | Admitting: Cardiology

## 2018-04-14 MED ORDER — AMIODARONE HCL 200 MG PO TABS: 200.0000 mg | ORAL_TABLET | Freq: Every day | ORAL | 1 refills | Status: DC

## 2018-04-14 NOTE — Telephone Encounter (Signed)
°*  STAT* If patient is at the pharmacy, call can be transferred to refill team.   1. Which medications need to be refilled? (please list name of each medication and dose if known) Amiodorone 200 takes 1 daily   2. Which pharmacy/location (including street and city if local pharmacy) is medication to be sent to?[facility] [address]. Do they need a 30 day or 90 day supply? [place]

## 2018-04-14 NOTE — Telephone Encounter (Signed)
Amiodarone 200 mg daily refilled.

## 2018-04-17 ENCOUNTER — Other Ambulatory Visit: Payer: Self-pay

## 2018-04-17 MED ORDER — AMIODARONE HCL 200 MG PO TABS: 200.0000 mg | ORAL_TABLET | Freq: Every day | ORAL | 1 refills | Status: DC

## 2018-04-17 NOTE — Telephone Encounter (Signed)
error 

## 2018-04-18 ENCOUNTER — Other Ambulatory Visit: Payer: Self-pay | Admitting: Cardiology

## 2018-04-25 ENCOUNTER — Ambulatory Visit (HOSPITAL_COMMUNITY)
Admission: RE | Admit: 2018-04-25 | Discharge: 2018-04-25 | Disposition: A | Discharge status: Home / Self Care | Payer: Medicare Other | Source: Ambulatory Visit | Attending: Physician Assistant | Admitting: Physician Assistant

## 2018-04-25 ENCOUNTER — Ambulatory Visit (HOSPITAL_COMMUNITY): Payer: Medicare Other

## 2018-04-25 ENCOUNTER — Ambulatory Visit (HOSPITAL_BASED_OUTPATIENT_CLINIC_OR_DEPARTMENT_OTHER)
Admission: RE | Admit: 2018-04-25 | Discharge: 2018-04-25 | Disposition: A | Discharge status: Home / Self Care | Payer: Medicare Other | Source: Ambulatory Visit | Attending: Physician Assistant | Admitting: Physician Assistant

## 2018-04-25 DIAGNOSIS — Z01818 Encounter for other preprocedural examination: Secondary | ICD-10-CM | POA: Diagnosis present

## 2018-04-25 DIAGNOSIS — I517 Cardiomegaly: Secondary | ICD-10-CM | POA: Insufficient documentation

## 2018-04-25 DIAGNOSIS — I2510 Atherosclerotic heart disease of native coronary artery without angina pectoris: Secondary | ICD-10-CM | POA: Diagnosis not present

## 2018-04-25 DIAGNOSIS — J811 Chronic pulmonary edema: Secondary | ICD-10-CM | POA: Insufficient documentation

## 2018-04-25 DIAGNOSIS — I350 Nonrheumatic aortic (valve) stenosis: Secondary | ICD-10-CM

## 2018-04-25 DIAGNOSIS — I6523 Occlusion and stenosis of bilateral carotid arteries: Secondary | ICD-10-CM | POA: Diagnosis not present

## 2018-04-25 DIAGNOSIS — I700 Atherosclerosis of aorta: Secondary | ICD-10-CM | POA: Insufficient documentation

## 2018-04-25 DIAGNOSIS — R918 Other nonspecific abnormal finding of lung field: Secondary | ICD-10-CM | POA: Diagnosis not present

## 2018-04-25 LAB — PULMONARY FUNCTION TEST
DL/VA % pred: 73 %
DL/VA: 3.36 ml/min/mmHg/L
DLCO unc % pred: 39 %
DLCO unc: 12.76 ml/min/mmHg
FEF 25-75 Pre: 2.86 L/sec
FEF2575-%Pred-Pre: 131 %
FEV1-%CHANGE-POST: -22 %
FEV1-%Pred-Post: 57 %
FEV1-%Pred-Pre: 74 %
FEV1-Post: 1.74 L
FEV1-Pre: 2.26 L
FEV1FVC-%Change-Post: 9 %
FEV1FVC-%Pred-Pre: 116 %
FEV6-%Change-Post: -29 %
FEV6-%PRED-POST: 48 %
FEV6-%Pred-Pre: 67 %
FEV6-Post: 1.89 L
FEV6-Pre: 2.67 L
FEV6FVC-%Change-Post: 0 %
FEV6FVC-%PRED-PRE: 106 %
FEV6FVC-%Pred-Post: 106 %
FVC-%Change-Post: -29 %
FVC-%Pred-Post: 44 %
FVC-%Pred-Pre: 63 %
FVC-Post: 1.89 L
FVC-Pre: 2.68 L
Post FEV1/FVC ratio: 92 %
Post FEV6/FVC ratio: 100 %
Pre FEV1/FVC ratio: 84 %
Pre FEV6/FVC Ratio: 99 %
RV % pred: 63 %
RV: 1.64 L
TLC % pred: 64 %
TLC: 4.51 L

## 2018-04-25 IMAGING — CT CT ANGIO CHEST
1 of 3 series · 1 of 27 positions shown · IV contrast (iopamidol)
Comparison: Chest CT 01/17/2018. CT the abdomen and pelvis
05/16/2017.

CLINICAL DATA: 77-year-old male with history of severe aortic
stenosis. Preprocedural study prior to potential transcatheter
aortic valve replacement (TAVR) procedure.

EXAM:
CT ANGIOGRAPHY CHEST, ABDOMEN AND PELVIS
TECHNIQUE: Multidetector CT imaging through the chest, abdomen and pelvis was
performed using the standard protocol during bolus administration of
intravenous contrast. Multiplanar reconstructed images and MIPs were
obtained and reviewed to evaluate the vascular anatomy.
CONTRAST:  100mL [id number] IOPAMIDOL (IZ7KIO-1EP) INJECTION 76%

[Series 345: — · 0.36mm/px · 1 of 9 slices shown]
[im 5/9]
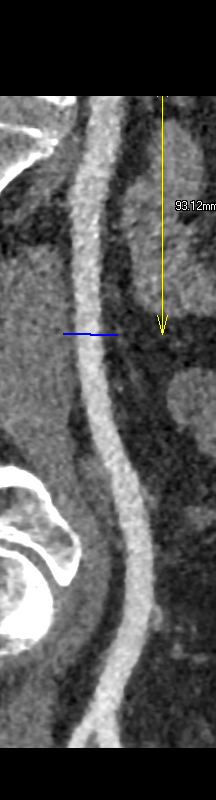

[1 of 27 positions shown; findings below may reference images not displayed]

FINDINGS: CTA CHEST FINDINGS

Cardiovascular: Heart size is mildly enlarged. Small amount of
pericardial fluid in a superior pericardial recess adjacent to the
ascending thoracic aorta incidentally noted. No other significant
pericardial fluid, thickening or pericardial calcification. There is
aortic atherosclerosis, as well as atherosclerosis of the great
vessels of the mediastinum and the coronary arteries, including
calcified atherosclerotic plaque in the left anterior descending,
left circumflex and right coronary arteries. Severe calcifications
of the aortic valve. Severe calcifications of the mitral annulus.
Left-sided pacemaker device in place with lead tips terminating in
the right atrium and right ventricular apex.

Mediastinum/Lymph Nodes: No pathologically enlarged mediastinal or
hilar lymph nodes. Esophagus is unremarkable in appearance. No
axillary lymphadenopathy.

Lungs/Pleura: Pleural thickening and multiple calcified pleural
plaques in the right hemithorax, likely sequela of remote infection.
No calcified pleural plaques are noted in the left hemithorax.
Patchy areas of ground-glass attenuation and interlobular septal
thickening are noted throughout the lungs bilaterally, favored to
reflect a background of interstitial pulmonary edema. No
consolidative airspace disease. No definite suspicious appearing
pulmonary nodules or masses are noted.

Musculoskeletal/Soft Tissues: There are no aggressive appearing
lytic or blastic lesions noted in the visualized portions of the
skeleton.

CTA ABDOMEN AND PELVIS FINDINGS

Hepatobiliary: No suspicious cystic or solid hepatic lesions. No
intra or extrahepatic biliary ductal dilatation. Several tiny
partially calcified gallstones lie dependently in the gallbladder.
No findings to suggest an acute cholecystitis at this time.

Pancreas: Diffuse fatty atrophy throughout the pancreas. No
pancreatic mass. No pancreatic ductal dilatation. No pancreatic or
peripancreatic fluid or inflammatory changes.

Spleen: Unremarkable.

Adrenals/Urinary Tract: Bilateral kidneys and bilateral adrenal
glands are normal in appearance. No hydroureteronephrosis. Urinary
bladder is normal in appearance.

Stomach/Bowel: Normal appearance of the stomach. No pathologic
dilatation of small bowel or colon. Normal appendix.

Vascular/Lymphatic: Minimal aortic atherosclerosis, without evidence
of aneurysm or dissection in the abdominal or pelvic vasculature.
Vascular findings and measurements pertinent to potential TAVR
procedure, as detailed below.

Reproductive: Prostate gland and seminal vesicles are unremarkable
in appearance.

Other: No significant volume of ascites.  No pneumoperitoneum.

Musculoskeletal: Irregular peripherally sclerotic lesion in the
anterior aspect of the left femoral head, likely to reflect a focus
of avascular necrosis. There are no other more aggressive appearing
lytic or blastic lesions noted in the visualized portions of the
skeleton.

VASCULAR MEASUREMENTS PERTINENT TO TAVR:

AORTA:

Minimal Aortic Siameter-XB x 18 mm

Severity of Aortic Calcification-minimal

RIGHT PELVIS:

Right Common Iliac Artery -

Minimal 8iameter-20.S x 8.7 mm

Tortuosity-mild

Calcification - none

Right External Iliac Artery -

Minimal 3iameter-Y.2 x 7.1 mm

Tortuosity-mild-to-moderate

Calcification - none

Right Common Femoral Artery -

Minimal Fiameter-Y.D x 7.9 mm

Tortuosity-mild

Calcification - none

LEFT PELVIS:

Left Common Iliac Artery -

Minimal Yiameter-1D.E x 10.1 mm

Tortuosity-mild

Calcification - none

Left External Iliac Artery -

Minimal Biameter-B.B x 8.3 mm

Tortuosity-mild-to-moderate

Calcification - none

Left Common Femoral Artery -

Minimal Ziameter-Y.J x 8.6 mm

Tortuosity-mild

Calcification-none

Review of the MIP images confirms the above findings.
IMPRESSION: 1. Vascular findings and measurements pertinent to potential TAVR
procedure, as detailed above.
2. Severe thickening calcification of the aortic valve, compatible
with the reported clinical history of severe aortic stenosis.
3. Cardiomegaly with evidence of interstitial pulmonary edema in the
lungs, concerning for congestive heart failure.
4. Aortic atherosclerosis, in addition to left main and 3 vessel
coronary artery disease. Assessment for potential risk factor
modification, dietary therapy or pharmacologic therapy may be
warranted, if clinically indicated.
5. There are also severe calcifications of the mitral annulus.
6. Mild pleural thickening and calcified pleural plaques in the
right hemithorax, likely sequela of remote right-sided infection.
7. Additional incidental findings, as above.

## 2018-04-25 MED ORDER — IOPAMIDOL (ISOVUE-370) INJECTION 76%
100.0000 mL | Freq: Once | INTRAVENOUS | Status: AC | PRN
  Administered 2018-04-25: 100 mL via INTRAVENOUS

## 2018-04-25 MED ORDER — ALBUTEROL SULFATE (2.5 MG/3ML) 0.083% IN NEBU
2.5000 mg | INHALATION_SOLUTION | Freq: Once | RESPIRATORY_TRACT | Status: AC
  Administered 2018-04-25: 2.5 mg via RESPIRATORY_TRACT

## 2018-04-25 NOTE — Progress Notes (Signed)
Carotid duplex completed. Preliminary results can be found under CV proc in chart review.   Landry Mellow, RDMS, RVT

## 2018-05-01 ENCOUNTER — Ambulatory Visit: Payer: Medicare Other | Attending: Physician Assistant | Admitting: Physical Therapy

## 2018-05-01 ENCOUNTER — Other Ambulatory Visit: Payer: Self-pay

## 2018-05-01 ENCOUNTER — Encounter: Payer: Self-pay | Admitting: Thoracic Surgery (Cardiothoracic Vascular Surgery)

## 2018-05-01 ENCOUNTER — Institutional Professional Consult (permissible substitution) (INDEPENDENT_AMBULATORY_CARE_PROVIDER_SITE_OTHER): Payer: Medicare Other | Admitting: Thoracic Surgery (Cardiothoracic Vascular Surgery)

## 2018-05-01 ENCOUNTER — Encounter: Payer: Self-pay | Admitting: Physical Therapy

## 2018-05-01 VITALS — BP 126/80 | HR 85 | Resp 16 | Ht 70.0 in | Wt 248.0 lb

## 2018-05-01 DIAGNOSIS — I350 Nonrheumatic aortic (valve) stenosis: Secondary | ICD-10-CM

## 2018-05-01 DIAGNOSIS — I480 Paroxysmal atrial fibrillation: Secondary | ICD-10-CM

## 2018-05-01 DIAGNOSIS — R262 Difficulty in walking, not elsewhere classified: Secondary | ICD-10-CM

## 2018-05-01 DIAGNOSIS — Z7901 Long term (current) use of anticoagulants: Secondary | ICD-10-CM

## 2018-05-01 DIAGNOSIS — J61 Pneumoconiosis due to asbestos and other mineral fibers: Secondary | ICD-10-CM

## 2018-05-01 DIAGNOSIS — I4819 Other persistent atrial fibrillation: Secondary | ICD-10-CM

## 2018-05-01 DIAGNOSIS — I5032 Chronic diastolic (congestive) heart failure: Secondary | ICD-10-CM

## 2018-05-01 NOTE — Patient Instructions (Addendum)
Stop taking Xarelto on Thursday 12/12  Continue taking all other medications without change through the day before surgery.  Have nothing to eat or drink after midnight the night before surgery.  On the morning of surgery do not take any medications

## 2018-05-01 NOTE — H&P (View-Only) (Signed)
[facility] [place] [facility] CONSULTATION REPORT  Referring Provider is Sherren Mocha, MD Primary Cardiologist is Park Liter, MD PCP is Garlon Hatchet Jaymes Graff, MD  Chief Complaint  Patient presents with  . Aortic Stenosis    Surgical eval for TAVR, review all studies  . 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  Patient states that he has known of a presence of a heart murmur for many years, having previously been told the presence of a heart murmur when he joined the [employer] as a young man.  He has been followed for the last several years by Dr. Agustin Cree.  Echocardiograms have documented the presence of aortic stenosis that has gradually progressed in severity.  He has a long-standing history of atrial fibrillation for which he has been anticoagulated using Xarelto.  In 2013 he underwent implantation of permanent pacemaker for complete heart block.  The patient states that over the past 2 to 3 years he has developed progressive symptoms of exertional shortness of breath and fatigue.  Recent follow-up echocardiogram revealed significant progression and severity of aortic stenosis with peak velocity across aortic valve measured 4.3 m/s corresponding to mean transvalvular gradient estimated 48 mmHg.  Left ventricular systolic function remains preserved with ejection fraction estimated 55 to 60%.  There was significant calcification of the mitral annulus but only  mild mitral regurgitation and no significant mitral stenosis.  Left and right heart catheterization performed March 28, 2018 by Dr. Tamala Julian confirmed the presence of aortic stenosis and revealed moderate coronary artery disease.  Peak to peak and mean transvalvular gradients across the aortic valve were measured 45 and 30 mmHg, respectively, corresponding to aortic valve area calculated 1.3 cm.  There was mild pulmonary hypertension.  The patient was referred to the multidisciplinary heart valve clinic and has been evaluated previously by Dr. Burt Knack.  CT angiography was performed and the patient referred for surgical consultation.  Patient is married and lives locally in [place] with his wife.  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  He has remained reasonably active physically and without any significant physical limitations.  However, the patient describes a long history of symptoms of mild exertional shortness of breath which have progressed since the initially over the past 2 to 3 years.  He now gets short of breath with low-level activity and is frequently limits his day-to-day activities.  When he gets periods of shortness of breath he also will feel tightness across his chest.  He denies any resting shortness of breath or chest discomfort.  He does occasionally get shortness of breath at night when he is sleeping and he is not comfortable laying flat in bed.  He reports occasional palpitations.  He has had some lower extremity edema.  He has not had any dizzy spells or syncope.  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  Past Medical History:  Diagnosis Date  . Atrial  fibrillation ([place])    Xarelto  . Diastolic heart failure ([place])   . Hyperlipidemia   . Hypertension   . Impaired glucose tolerance   . Pacemaker     Past Surgical  History:  Procedure Laterality Date  . CARDIAC CATHETERIZATION  2014   non-obs dz, done at [facility]  . CARDIOVERSION N/A 09/02/2017   Procedure: CARDIOVERSION;  Surgeon: Lelon Perla, MD;  Location: [facility] ENDOSCOPY;  Service: Cardiovascular;  Laterality: N/A;  . PACEMAKER IMPLANT  03/2012  . REPLACEMENT TOTAL KNEE Left   . RIGHT/LEFT HEART CATH AND CORONARY ANGIOGRAPHY N/A 03/28/2018   Procedure: RIGHT/LEFT HEART CATH AND CORONARY ANGIOGRAPHY;  Surgeon: Belva Crome, MD;  Location: [facility];  Service: Cardiovascular;  Laterality: N/A;  . VASECTOMY      Family History  Problem Relation Age of Onset  . CAD Father   . Stroke Father   . Colon cancer Sister     Social History   Socioeconomic History  . Marital status: Married    Spouse name: Not on file  . Number of children: Not on file  . Years of education: Not on file  . Highest education level: Not on file  Occupational History  . Occupation: retired  Scientific laboratory technician  . Financial resource strain: Not on file  . Food insecurity:    Worry: Not on file    Inability: Not on file  . Transportation needs:    Medical: Not on file    Non-medical: Not on file  Tobacco Use  . Smoking status: Former Smoker    Last attempt to quit: 1978    Years since quitting: 41.9  . Smokeless tobacco: Never Used  Substance and Sexual Activity  . Alcohol use: No    Frequency: Never  . Drug use: No  . Sexual activity: Not on file  Lifestyle  . Physical activity:    Days per week: Not on file    Minutes per session: Not on file  . Stress: Not on file  Relationships  . Social connections:    Talks on phone: Not on file    Gets together: Not on file    Attends religious service: Not on file    Active member of club or organization: Not on file    Attends meetings of clubs or organizations: Not on file    Relationship status: Not on file  . Intimate partner violence:    Fear of current or ex partner: Not on file     Emotionally abused: Not on file    Physically abused: Not on file    Forced sexual activity: Not on file  Other Topics Concern  . Not on file  Social History Narrative   Pt lives in [place]    Current Outpatient Medications  Medication Sig Dispense Refill  . acetaminophen (TYLENOL) 500 MG tablet Take 1,000 mg by mouth every 6 (six) hours as needed for moderate pain or headache.    Marland Kitchen amiodarone (PACERONE) 200 MG tablet Take 1 tablet (200 mg total) by mouth daily. 90 tablet 1  . metoprolol tartrate (LOPRESSOR) 25 MG tablet TAKE 1 TABLET BY MOUTH TWICE A DAY 60 tablet 2  . nitroGLYCERIN (NITROSTAT) 0.4 MG SL tablet Place 0.4 mg under the tongue every 5 (five) minutes as needed for chest pain.    . rivaroxaban (XARELTO) 20 MG TABS tablet Take 1 tablet (20 mg total) by mouth daily with supper. 90 tablet 2  . rosuvastatin (  CRESTOR) 20 MG tablet Take 20 mg by mouth at bedtime.    . tamsulosin (FLOMAX) 0.4 MG CAPS capsule Take 1 capsule (0.4 mg total) by mouth daily after supper. 30 capsule 0   No current facility-administered medications for this visit.     Allergies  Allergen Reactions  . Allopurinol Rash  . Atorvastatin Rash      Review of Systems:   General:  normal appetite, decreased energy, + weight gain, no weight loss, no fever  Cardiac:  + chest pain with exertion, no chest pain at rest, +SOB with exertion, occasional resting SOB, no PND, + orthopnea, + palpitations, + arrhythmia, + atrial fibrillation, + LE edema, no dizzy spells, no syncope  Respiratory:  + shortness of breath, no home oxygen, no productive cough, + dry cough, no bronchitis, no wheezing, trace hemoptysis, no asthma, no pain with inspiration or cough, no sleep apnea, no CPAP at night  GI:   no difficulty swallowing, no reflux, no frequent heartburn, no hiatal hernia, no abdominal pain, no constipation, no diarrhea, no hematochezia, no hematemesis, no melena  GU:   no dysuria,  + frequency, no urinary tract  infection, no hematuria, + enlarged prostate, + kidney stones, no kidney disease  Vascular:  no pain suggestive of claudication, no pain in feet, occasional leg cramps, no varicose veins, no DVT, no non-healing foot ulcer  Neuro:   no stroke, no TIA's, no seizures, no headaches, no temporary blindness one eye,  no slurred speech, no peripheral neuropathy, no chronic pain, no instability of gait, mild memory/cognitive dysfunction  Musculoskeletal: + arthritis, + joint swelling, + myalgias, minor difficulty walking, normal mobility   Skin:   no rash, no itching, no skin infections, no pressure sores or ulcerations  Psych:   no anxiety, no depression, no nervousness, no unusual recent stress  Eyes:   no blurry vision, no floaters, no recent vision changes, + wears glasses or contacts  ENT:   no hearing loss, no loose or painful teeth, + dentures, last saw dentist 2018  Hematologic:  no easy bruising, no abnormal bleeding, no clotting disorder, + frequent epistaxis  Endocrine:  no diabetes, does not check CBG's at home           Physical Exam:   BP 126/80 (BP Location: Right Arm, Patient Position: Sitting, Cuff Size: Large)   Pulse 85   Resp 16   Ht 5\' 10"  (1.778 m)   Wt 248 lb (112.5 kg)   SpO2 95% Comment: RA  BMI 35.58 kg/m   General:  Moderately obese,  well-appearing  HEENT:  Unremarkable   Neck:   no JVD, no bruits, no adenopathy   Chest:   clear to auscultation, symmetrical breath sounds, no wheezes, no rhonchi   CV:   RRR, grade III/VI crescendo/decrescendo murmur heard best at RSB,  no diastolic murmur  Abdomen:  soft, non-tender, no masses   Extremities:  warm, well-perfused, pulses diminished but palpable, no LE edema  Rectal/GU  Deferred  Neuro:   Grossly non-focal and symmetrical throughout  Skin:   Clean and dry, no rashes, no breakdown   Diagnostic Tests:  Transthoracic Echocardiography  Patient:    Marlowe, Lawes MR #:       [id number] Study Date:  03/20/2018 Gender:     M Age:        38 Height:     177.8 cm Weight:     113.9 kg BSA:  2.42 m^2 Pt. Status: Room:   Darliss Ridgel  ATTENDING    [name], MD  ORDERING     [name], MD  REFERRING    [name], MD  SONOGRAPHER  Jimmy Reel, RDCS  PERFORMING   [facility] [place]  cc:  ------------------------------------------------------------------- LV EF: 55% -   60%  ------------------------------------------------------------------- Indications:      Aortic stenosis 424.1.  ------------------------------------------------------------------- History:   PMH:   Atrial fibrillation.  Congestive heart failure. Moderate aortic stenosis.  Mild mitral stenosis.  Risk factors: Hypertension. Dyslipidemia.  ------------------------------------------------------------------- Study Conclusions  - Left ventricle: The cavity size was normal. Wall thickness was   increased in a pattern of moderate LVH. Systolic function was   normal. The estimated ejection fraction was in the range of 55%   to 60%. Wall motion was normal; there were no regional wall   motion abnormalities. Features are consistent with a pseudonormal   left ventricular filling pattern, with concomitant abnormal   relaxation and increased filling pressure (grade 2 diastolic   dysfunction). - Ventricular septum: The outflow septum had a sigmoid appearance. - Aortic valve: There was severe stenosis. Valve area (VTI): 1.05   cm^2. Valve area (Vmax): 1.16 cm^2. Valve area (Vmean): 1.04   cm^2. - Mitral valve: Calcified annulus. There was mild regurgitation.   Valve area by pressure half-time: 2.39 cm^2. Valve area by   continuity equation (using LVOT flow): 2.19 cm^2. - Left atrium: The atrium was severely dilated.  Impressions:  - Normal LVEF.   Moderate LVH.   Severe AS.   Mild MR, trace TR.   Severe LAE.   Pseudonormal transmitral flow  pattern.  ------------------------------------------------------------------- Labs, prior tests, procedures, and surgery: Permanent pacemaker system implantation.  ------------------------------------------------------------------- Study data:  No prior study was available for comparison.  Study status:  Routine.  Procedure:  The patient reported no pain pre or post test. Transthoracic echocardiography. Image quality was adequate.  Study completion:  There were no complications. Transthoracic echocardiography.  M-mode, complete 2D, spectral Doppler, and color Doppler.  Birthdate:  Patient birthdate: Nov 11, 1940.  Age:  Patient is 77 yr old.  Sex:  Gender: male. BMI: 36 kg/m^2.  Blood pressure:     136/64  Patient status: Outpatient.  Study date:  Study date: 03/20/2018. Study time: 09:23 AM.  Location:  Echo laboratory.  -------------------------------------------------------------------  ------------------------------------------------------------------- Left ventricle:  The cavity size was normal. Wall thickness was increased in a pattern of moderate LVH. Systolic function was normal. The estimated ejection fraction was in the range of 55% to 60%. Wall motion was normal; there were no regional wall motion abnormalities. Features are consistent with a pseudonormal left ventricular filling pattern, with concomitant abnormal relaxation and increased filling pressure (grade 2 diastolic dysfunction).  ------------------------------------------------------------------- Aortic valve:   Trileaflet; severely thickened, severely calcified leaflets. Mobility was not restricted.  Doppler:   There was severe stenosis.   There was no regurgitation.    VTI ratio of LVOT to aortic valve: 0.3. Valve area (VTI): 1.05 cm^2. Indexed valve area (VTI): 0.43 cm^2/m^2. Peak velocity ratio of LVOT to aortic valve: 0.33. Valve area (Vmax): 1.16 cm^2. Indexed valve area (Vmax): 0.48 cm^2/m^2. Mean  velocity ratio of LVOT to aortic valve: 0.3. Valve area (Vmean): 1.04 cm^2. Indexed valve area (Vmean): 0.43 cm^2/m^2.    Mean gradient (S): 48 mm Hg. Peak gradient (S): 75 mm Hg.  ------------------------------------------------------------------- Aorta:  Aortic root: The aortic root was normal in size.  -------------------------------------------------------------------  Mitral valve:   Calcified annulus. Mobility was not restricted. Doppler:  Transvalvular velocity was within the normal range. There was no evidence for stenosis. There was mild regurgitation. Valve area by pressure half-time: 2.39 cm^2. Indexed valve area by pressure half-time: 0.99 cm^2/m^2. Valve area by continuity equation (using LVOT flow): 2.19 cm^2. Indexed valve area by continuity equation (using LVOT flow): 0.91 cm^2/m^2.    Mean gradient (D): 4 mm Hg. Peak gradient (D): 7 mm Hg.  ------------------------------------------------------------------- Left atrium:  The atrium was severely dilated.  ------------------------------------------------------------------- Right ventricle:  The cavity size was normal. Wall thickness was normal. Systolic function was normal.  ------------------------------------------------------------------- Ventricular septum:    The outflow septum had a sigmoid appearance.   ------------------------------------------------------------------- Pulmonic valve:    Structurally normal valve.   Cusp separation was normal.  Doppler:  Transvalvular velocity was within the normal range. There was no evidence for stenosis. There was trivial regurgitation.  ------------------------------------------------------------------- Tricuspid valve:   Structurally normal valve.    Doppler: Transvalvular velocity was within the normal range. There was mild regurgitation.  ------------------------------------------------------------------- Pulmonary artery:   The main pulmonary artery was  normal-sized. Systolic pressure was within the normal range.  ------------------------------------------------------------------- Right atrium:  The atrium was normal in size.  ------------------------------------------------------------------- Pericardium:  There was no pericardial effusion.  ------------------------------------------------------------------- Systemic veins: Inferior vena cava: The vessel was normal in size.  ------------------------------------------------------------------- Measurements   Left ventricle                           Value          Reference  LV ID, ED, PLAX chordal          (H)     57    mm       43 - 52  LV ID, ES, PLAX chordal          (H)     42    mm       23 - 38  LV fx shortening, PLAX chordal   (L)     26    %        >=29  LV PW thickness, ED                      14    mm       ----------  IVS/LV PW ratio, ED                      1              <=1.3  Stroke volume, 2D                        127   ml       ----------  Stroke volume/bsa, 2D                    53    ml/m^2   ----------  LV e&', lateral                           5.44  cm/s     ----------  LV E/e&', lateral                         24.63          ----------  LV e&', medial                            4.13  cm/s     ----------  LV E/e&', medial                          32.45          ----------  LV e&', average                           4.79  cm/s     ----------  LV E/e&', average                         28             ----------    Ventricular septum                       Value          Reference  IVS thickness, ED                        14    mm       ----------    LVOT                                     Value          Reference  LVOT ID, S                               21    mm       ----------  LVOT area                                3.46  cm^2     ----------  LVOT peak velocity, S                    145   cm/s     ----------  LVOT mean velocity, S                     98.3  cm/s     ----------  LVOT VTI, S                              36.6  cm       ----------  LVOT peak gradient, S                    8     mm Hg    ----------    Aortic valve                             Value          Reference  Aortic valve peak velocity, S            433   cm/s     ----------  Aortic valve mean velocity, S  328   cm/s     ----------  Aortic valve VTI, S                      121   cm       ----------  Aortic mean gradient, S                  48    mm Hg    ----------  Aortic peak gradient, S                  75    mm Hg    ----------  VTI ratio, LVOT/AV                       0.3            ----------  Aortic valve area, VTI                   1.05  cm^2     ----------  Aortic valve area/bsa, VTI               0.43  cm^2/m^2 ----------  Velocity ratio, peak, LVOT/AV            0.33           ----------  Aortic valve area, peak velocity         1.16  cm^2     ----------  Aortic valve area/bsa, peak              0.48  cm^2/m^2 ----------  velocity  Velocity ratio, mean, LVOT/AV            0.3            ----------  Aortic valve area, mean velocity         1.04  cm^2     ----------  Aortic valve area/bsa, mean              0.43  cm^2/m^2 ----------  velocity    Aorta                                    Value          Reference  Aortic root ID, ED                       33    mm       ----------  Ascending aorta ID, A-P, S               36    mm       ----------    Left atrium                              Value          Reference  LA ID, A-P, ES                           62    mm       ----------  LA ID/bsa, A-P                   (H)     2.57  cm/m^2   <=2.2  LA volume, S  130   ml       ----------  LA volume/bsa, S                         53.8  ml/m^2   ----------  LA volume, ES, 1-p A4C                   99.6  ml       ----------  LA volume/bsa, ES, 1-p A4C               41.2  ml/m^2   ----------  LA volume, ES, 1-p A2C                    158   ml       ----------  LA volume/bsa, ES, 1-p A2C               65.4  ml/m^2   ----------    Mitral valve                             Value          Reference  Mitral E-wave peak velocity              134   cm/s     ----------  Mitral A-wave peak velocity              132   cm/s     ----------  Mitral mean velocity, D                  90.5  cm/s     ----------  Mitral deceleration time         (H)     313   ms       150 - 230  Mitral pressure half-time                92    ms       ----------  Mitral mean gradient, D                  4     mm Hg    ----------  Mitral peak gradient, D                  7     mm Hg    ----------  Mitral E/A ratio, peak                   1              ----------  Mitral valve area, PHT, DP               2.39  cm^2     ----------  Mitral valve area/bsa, PHT, DP           0.99  cm^2/m^2 ----------  Mitral valve area, LVOT                  2.19  cm^2     ----------  continuity  Mitral valve area/bsa, LVOT              0.91  cm^2/m^2 ----------  continuity  Mitral annulus VTI, D                    57.8  cm       ----------  Tricuspid valve                          Value          Reference  Tricuspid regurg peak velocity           282   cm/s     ----------  Tricuspid peak RV-RA gradient            32    mm Hg    ----------    Right atrium                             Value          Reference  RA ID, S-I, ES, A4C              (H)     62.3  mm       34 - 49  RA area, ES, A4C                 (H)     22.8  cm^2     8.3 - 19.5  RA volume, ES, A/L                       70.2  ml       ----------  RA volume/bsa, ES, A/L                   29.1  ml/m^2   ----------    Right ventricle                          Value          Reference  TAPSE                                    26.3  mm       ----------  RV s&', lateral, S                        8.81  cm/s     ----------  Legend: (L)  and  (H)  mark values outside specified reference  range.  ------------------------------------------------------------------- Prepared and Electronically Authenticated by  [name], MD [id number]:45:56   RIGHT/LEFT HEART CATH AND CORONARY ANGIOGRAPHY  Conclusion    Moderately severe calcific aortic stenosis.  Peak to peak gradient 45 mmHg, mean gradient 30 mmHg, and calculated aortic valve area 1.3 cm based upon a cardiac output of 6.6 L/min/index 2.92 L/min.  Moderate proximal RCA and proximal to mid LAD bulky calcification with plaque up to 60%.  Second diagonal contains 70% stenosis but is a small vessel and management should be with medical therapy.  Calcified left main but no significant obstruction.  Circumflex is widely patent.  Left ventriculography was not performed.  LVEDP was 20.  Mild pulmonary hypertension with PA systolic pressure of 45 mmHg and mean pulmonary capillary wedge pressure 14 mmHg.  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  RECOMMENDATIONS:   Probably a reasonable candidate for TAVR for moderately severe aortic stenosis.  Dyspnea on exertion is his major complaint and there is likely contribution from chronic lung disease/asbestosis.  Valve treatment may not completely resolve dyspnea on exertion.  Recommend Aspirin 81mg  daily for moderate  CAD.  Indications   DOE (dyspnea on exertion) [R06.09 (ICD-10-CM)]  Aortic stenosis, severe [I35.0 (ICD-10-CM)]  Procedural Details   Technical Details The right radial area was sterilely prepped and draped. Intravenous sedation with Versed and fentanyl was administered. 1% Xylocaine was infiltrated to achieve local analgesia. Using real-time vascular ultrasound, a double wall stick with an angiocath was utilized to obtain intra-arterial access. A VUS image was saved for the permanent record.The modified Seldinger technique was used to place a 63F " Slender" sheath in the right radial artery. Weight based heparin was  administered. Coronary angiography was done using 5 F catheters. Right and left coronary angiography was performed with a JR4. Additional right coronary images were obtained with an EZ-Rad right 5 French coronary guide catheter. Left ventricular hemodymic recordings were made using the [place] Rad right catheter. Left ventriculography was not performed.  Fluoroscopy demonstrated severe calcification of the right hemidiaphragm as well as right pleural calcification potentially consistent with asbestosis.  Right heart catheterization was performed by exchanging a previously placed antecubital IV angio-cath for a 5 French Slender sheath. 1% Xylocaine was used to locally nesthetize the area around the IV site. The IV catheter was wired using an .018 guidewire. The modified Seldinger technique was used to place the 5 [place] sheath. Double glove technique was used to enhance sterility. After sheath insertion, right heart cath was performed using a 5 French balloon tipped catheter and fluoroscopic guidance. Pressures were recorded in each chamber and in the pulmonary capillary wedge position.. The main pulmonary artery O2 saturation was sampled.   Hemostasis was achieved using a pneumatic band.  During this procedure the patient is administered a total of Versed 1.5 mg and Fentanyl 50 mg to achieve and maintain moderate conscious sedation. The patient's heart rate, blood pressure, and oxygen saturation are monitored continuously during the procedure. The period of conscious sedation is 56 minutes, of which I was present face-to-face 100% of this time.   Estimated blood loss <50 mL.  During this procedure the patient was administered the following to achieve and maintain moderate conscious sedation: Versed 1.5 mg, Fentanyl 50 mcg, while the patient's heart rate, blood pressure, and oxygen saturation were continuously monitored. The period of conscious sedation was 56 minutes, of which I was present face-to-face 100%  of this time.  Medications  (Filter: Administrations occurring from 03/28/18 0825 to 03/28/18 0945)  Medication Rate/Dose/Volume Action  Date Time   fentaNYL (SUBLIMAZE) injection (mcg) 25 mcg Given 03/28/18 0838   Total dose as of 05/01/18 1155 25 mcg Given 0904   50 mcg        midazolam (VERSED) injection (mg) 1 mg Given 03/28/18 0838   Total dose as of 05/01/18 1155 0.5 mg Given 0903   1.5 mg        Heparin (Porcine) in NaCl 1000-0.9 UT/500ML-% SOLN (mL) 500 mL Given 03/28/18 0850   Total dose as of 05/01/18 1155 500 mL Given 0850   1,000 mL        lidocaine (PF) (XYLOCAINE) 1 % injection (mL) 2 mL Given 03/28/18 0900   Total dose as of 05/01/18 1155 2 mL Given 0904   4 mL        Radial Cocktail/Verapamil only (mL) 10 mL Given 03/28/18 0902   Total dose as of 05/01/18 1155        10 mL        heparin injection (Units) 5,000 Units Given 03/28/18 0914   Total dose as of  05/01/18 1155        5,000 Units        iohexol (OMNIPAQUE) 350 MG/ML injection (mL) 60 mL Given 03/28/18 0930   Total dose as of 05/01/18 1155        60 mL        Sedation Time   Sedation Time Physician-1: 56 minutes 54 seconds  Coronary Findings   Diagnostic  Dominance: Right  Left Anterior Descending  Prox LAD lesion 60% stenosed  Prox LAD lesion is 60% stenosed.  First Diagonal Branch  Ost 1st Diag lesion 40% stenosed  Ost 1st Diag lesion is 40% stenosed.  Second Diagonal Branch  2nd Diag lesion 80% stenosed  2nd Diag lesion is 80% stenosed.  Left Circumflex  First Obtuse Marginal Branch  Vessel is large in size.  Right Coronary Artery  Prox RCA lesion 50% stenosed  Prox RCA lesion is 50% stenosed.  Intervention   No interventions have been documented.  Right Heart   Right Heart Pressures Hemodynamic findings consistent with pulmonary hypertension. Elevated LV EDP consistent with volume overload.  Right Atrium Right atrial pressure is normal.  Left Heart   Aortic Valve There is moderate  aortic valve stenosis. The aortic valve is calcified. There is restricted aortic valve motion.  Coronary Diagrams   Diagnostic  Dominance: Right    Intervention   Implants    No implant documentation for this case.  MERGE Images   Show images for CARDIAC CATHETERIZATION   Link to Procedure Log   Procedure Log    Hemo Data    Most Recent Value  Fick Cardiac Output 6.64 L/min  Fick Cardiac Output Index 2.92 (L/min)/BSA  Aortic Mean Gradient 29.54 mmHg  Aortic Peak Gradient 45 mmHg  Aortic Valve Area 1.31  Aortic Value Area Index 0.58 cm2/BSA  RA A Wave 7 mmHg  RA V Wave 7 mmHg  RA Mean 5 mmHg  RV Systolic Pressure 46 mmHg  RV Diastolic Pressure 6 mmHg  RV EDP 9 mmHg  PA Systolic Pressure 45 mmHg  PA Diastolic Pressure 14 mmHg  PA Mean 29 mmHg  PW A Wave 19 mmHg  PW V Wave 18 mmHg  PW Mean 14 mmHg  AO Systolic Pressure 324 mmHg  AO Diastolic Pressure 57 mmHg  AO Mean 80 mmHg  LV Systolic Pressure 401 mmHg  LV Diastolic Pressure 10 mmHg  LV EDP 20 mmHg  AOp Systolic Pressure 027 mmHg  AOp Diastolic Pressure 52 mmHg  AOp Mean Pressure 77 mmHg  LVp Systolic Pressure 253 mmHg  LVp Diastolic Pressure 13 mmHg  LVp EDP Pressure 22 mmHg  QP/QS 1  TPVR Index 9.93 HRUI  TSVR Index 27.4 HRUI  PVR SVR Ratio 0.2  TPVR/TSVR Ratio 0.36    Cardiac TAVR CT  TECHNIQUE: The patient was scanned on a [employer]. A 120 kV retrospective scan was triggered in the descending thoracic aorta at 111 HU's. Gantry rotation speed was 250 msecs and collimation was .6 mm. No beta blockade or nitro were given. The 3D data set was reconstructed in 5% intervals of the R-R cycle. Systolic and diastolic phases were analyzed on a dedicated work station using MPR, MIP and VRT modes. The patient received 80 cc of contrast.  FINDINGS: Aortic Valve: Trileaflet aortic valve with severely thickened and calcified leaflets and severely restricted leaflet opening. There are  moderate calcifications extending into the LVOT from under the right and left coronary sinus and continuing into the mitral  annular calcifications.  Aorta: Normal size with trivial atheroma ad calcifications. No dissection.  Sinotubular Junction: 32 x 31 mm  Ascending Thoracic Aorta: 35 x 34 mm  Aortic Arch: 34 x 31 mm  Descending Thoracic Aorta: 27 x 26 mm  Sinus of Valsalva Measurements:  Non-coronary: 35 mm  Right -coronary: 34 mm  Left -coronary: 36 mm  Coronary Artery Height above Annulus:  Left Main: 15 mm  Right Coronary: 18 mm  Virtual Basal Annulus Measurements:  Maximum/Minimum Diameter: 28.6 x 21.1 mm  Mean Diameter: 24.4 mm  Perimeter: 78.6 mm  Area: 468 mm2  Optimum Fluoroscopic Angle for Delivery:  LAO 2 CAU 2  IMPRESSION: 1. Trileaflet aortic valve with severely thickened and calcified leaflets and severely restricted leaflet opening. There are moderate calcifications extending into the LVOT from under the right and left coronary sinus and continuing into the mitral annular calcifications. Annular measurement suitable for delivery of a 26 mm Edwards-SAPIEN 3 valve.  2. Sufficient coronary to annulus distance.  3. Optimum Fluoroscopic Angle for Delivery: LAO 2 CAU 2  4. No thrombus in the left atrial appendage.  5. Mildly dilated pulmonary artery measuring 31 mm.  6. Atrial and ventricular leads are present.   Electronically Signed   By: Ena Dawley   On: 04/25/2018 14:20   CT ANGIOGRAPHY CHEST, ABDOMEN AND PELVIS  TECHNIQUE: Multidetector CT imaging through the chest, abdomen and pelvis was performed using the standard protocol during bolus administration of intravenous contrast. Multiplanar reconstructed images and MIPs were obtained and reviewed to evaluate the vascular anatomy.  CONTRAST:  164mL ISOVUE-370 IOPAMIDOL (ISOVUE-370) INJECTION 76%  COMPARISON:  Chest CT 01/17/2018. CT the  abdomen and pelvis 05/16/2017.  FINDINGS: CTA CHEST FINDINGS  Cardiovascular: Heart size is mildly enlarged. Small amount of pericardial fluid in a superior pericardial recess adjacent to the ascending thoracic aorta incidentally noted. No other significant pericardial fluid, thickening or pericardial calcification. There is aortic atherosclerosis, as well as atherosclerosis of the great vessels of the mediastinum and the coronary arteries, including calcified atherosclerotic plaque in the left anterior descending, left circumflex and right coronary arteries. Severe calcifications of the aortic valve. Severe calcifications of the mitral annulus. Left-sided pacemaker device in place with lead tips terminating in the right atrium and right ventricular apex.  Mediastinum/Lymph Nodes: No pathologically enlarged mediastinal or hilar lymph nodes. Esophagus is unremarkable in appearance. No axillary lymphadenopathy.  Lungs/Pleura: Pleural thickening and multiple calcified pleural plaques in the right hemithorax, likely sequela of remote infection. No calcified pleural plaques are noted in the left hemithorax. Patchy areas of ground-glass attenuation and interlobular septal thickening are noted throughout the lungs bilaterally, favored to reflect a background of interstitial pulmonary edema. No consolidative airspace disease. No definite suspicious appearing pulmonary nodules or masses are noted.  Musculoskeletal/Soft Tissues: There are no aggressive appearing lytic or blastic lesions noted in the visualized portions of the skeleton.  CTA ABDOMEN AND PELVIS FINDINGS  Hepatobiliary: No suspicious cystic or solid hepatic lesions. No intra or extrahepatic biliary ductal dilatation. Several tiny partially calcified gallstones lie dependently in the gallbladder. No findings to suggest an acute cholecystitis at this time.  Pancreas: Diffuse fatty atrophy throughout the pancreas.  No pancreatic mass. No pancreatic ductal dilatation. No pancreatic or peripancreatic fluid or inflammatory changes.  Spleen: Unremarkable.  Adrenals/Urinary Tract: Bilateral kidneys and bilateral adrenal glands are normal in appearance. No hydroureteronephrosis. Urinary bladder is normal in appearance.  Stomach/Bowel: Normal appearance of the stomach. No pathologic dilatation of  small bowel or colon. Normal appendix.  Vascular/Lymphatic: Minimal aortic atherosclerosis, without evidence of aneurysm or dissection in the abdominal or pelvic vasculature. Vascular findings and measurements pertinent to potential TAVR procedure, as detailed below.  Reproductive: Prostate gland and seminal vesicles are unremarkable in appearance.  Other: No significant volume of ascites.  No pneumoperitoneum.  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 There are no other more aggressive appearing lytic or blastic lesions noted in the visualized portions of the skeleton.  VASCULAR MEASUREMENTS PERTINENT TO TAVR:  AORTA:  Minimal Aortic Diameter-17 x 18 mm  Severity of Aortic Calcification-minimal  RIGHT PELVIS:  Right Common Iliac Artery -  Minimal Diameter-10.4 x 8.7 mm  Tortuosity-mild  Calcification - none  Right External Iliac Artery -  Minimal Diameter-8.9 x 7.1 mm  Tortuosity-mild-to-moderate  Calcification - none  Right Common Femoral Artery -  Minimal Diameter-7.9 x 7.9 mm  Tortuosity-mild  Calcification - none  LEFT PELVIS:  Left Common Iliac Artery -  Minimal Diameter-10.8 x 10.1 mm  Tortuosity-mild  Calcification - none  Left External Iliac Artery -  Minimal Diameter-7.7 x 8.3 mm  Tortuosity-mild-to-moderate  Calcification - none  Left Common Femoral Artery -  Minimal Diameter-9.5 x 8.6  mm  Tortuosity-mild  Calcification-none  Review of the MIP images confirms the above findings.  IMPRESSION: 1. Vascular findings and measurements pertinent to potential TAVR procedure, as detailed above. 2. Severe thickening calcification of the aortic valve, compatible with the reported clinical history of severe aortic stenosis. 3. Cardiomegaly with evidence of interstitial pulmonary edema in the lungs, concerning for congestive heart failure. 4. Aortic atherosclerosis, in addition to left main and 3 vessel coronary artery disease. Assessment for potential risk factor modification, dietary therapy or pharmacologic therapy may be warranted, if clinically indicated. 5. There are also severe calcifications of the mitral annulus. 6. Mild pleural thickening and calcified pleural plaques in the right hemithorax, likely sequela of remote right-sided infection. 7. Additional incidental findings, as above.   Electronically Signed   By: Vinnie Langton M.D.   On: 04/25/2018 10:19  Pulmonary Function Tests  Baseline      Post-bronchodilator  FVC  2.68 L  (63% predicted) FVC  1.89 L  (44% predicted) FEV1  2.26 L  (74% predicted) FEV1  1.74 L  (57% predicted) FEF25-75 2.86 L  (131% predicted)   TLC  4.51 L  (64% predicted) RV  1.64 L  (63% predicted) DLCO  39% predicted   STS Risk Calculator  Procedure: AVR + CAB CALCULATE   Risk of Mortality:  4.609% Renal Failure:  6.032% Permanent Stroke:  1.651% Prolonged Ventilation:  17.981% DSW Infection:  0.483% Reoperation:  6.735% Morbidity or Mortality:  22.941% Short Length of Stay:  15.152% Long Length of Stay:  15.635%     Impression:  Patient has stage D severe symptomatic aortic stenosis, multivessel coronary artery disease, long-standing atrial fibrillation and 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  He presents with progressive symptoms of exertional shortness of breath and  fatigue consistent with chronic diastolic congestive heart failure, [place] Heart Association functional class III.  The patient also experiences symptoms of tightness across his chest when he is short of breath, possibly related to congestive heart failure but also possibly consistent with angina pectoris.  Symptoms have progressed over the last 2 years and now limit the patient's physical activity considerably.  I have  personally reviewed the patient's recent transthoracic echocardiogram, diagnostic cardiac catheterization, and CT angiograms.  Echocardiogram demonstrates the presence of severe aortic stenosis.  The aortic valve is trileaflet with severe thickening, calcification, and restricted leaflet mobility involving all 3 leaflets of the aortic valve.  Peak velocity across aortic valve measured 4.3 m/s corresponding to mean transvalvular gradient estimated 48 mmHg.  Left ventricular systolic function remains reasonably well preserved.  Diagnostic cardiac catheterization reveals moderate multivessel coronary artery disease.  There is probably at least 60% stenosis of the proximal right coronary artery, although it is unclear whether or not this lesion is flow-limiting.  There is 70% proximal stenosis in a diagonal branch, but there does not appear to be significant stenosis in the left anterior descending coronary artery.   I agree the patient needs aortic valve replacement.  Is unclear whether or not the proximal stenosis in the right coronary artery is flow-limiting.  However, I would recommend concomitant coronary artery bypass grafting to the right coronary artery if conventional surgical aortic valve replacement were to be planned.  The patient might also benefit from a concomitant Maze procedure.  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  Cardiac-gated CTA of the heart reveals anatomical characteristics consistent with aortic stenosis suitable for treatment by transcatheter aortic valve replacement without any significant complicating features, although there are some areas of calcification in the aortic annulus which might slightly increased risk of paravalvular leak.  CTA of the aorta and iliac vessels demonstrate what appears to be adequate pelvic vascular access to facilitate a transfemoral approach.    Plan:  The patient and his wife were counseled at length regarding treatment alternatives for management of severe symptomatic aortic stenosis. Alternative approaches such as conventional aortic valve replacement, transcatheter aortic valve replacement, and continued medical therapy without intervention were compared and contrasted at length.  The risks associated with conventional surgical aortic valve replacement were discussed in detail, as were expectations for post-operative convalescence.  Issues specific to transcatheter aortic valve replacement were discussed including questions about long term valve durability, the potential for paravalvular leak, possible increased risk of need for permanent pacemaker placement, and other technical complications related to the procedure itself.  Long-term prognosis with medical therapy was discussed. This discussion was placed in the context of the patient's own specific clinical presentation and past medical history.  All of their questions have been addressed.  The patient desires to proceed with TAVR in the near future.  We tentatively plan for surgery on Tuesday, May 09, 2018.  Following the decision to proceed with transcatheter aortic valve replacement, a discussion has been held regarding what types of management strategies would be attempted intraoperatively in the event of life-threatening complications,  including whether or not the patient would be considered a candidate for the use of cardiopulmonary bypass and/or conversion to open sternotomy for attempted surgical intervention.  The patient has been advised of a variety of complications that might develop including but not limited to risks of death, stroke, paravalvular leak, aortic dissection or other major vascular complications, aortic annulus rupture, device embolization, cardiac rupture or perforation, mitral regurgitation, acute myocardial infarction, arrhythmia, heart block or bradycardia requiring permanent pacemaker placement, congestive heart failure, respiratory failure, renal failure, pneumonia, infection, other late complications related to structural valve deterioration or migration, or  other complications that might ultimately cause a temporary or permanent loss of functional independence or other long term morbidity.  The patient provides full informed consent for the procedure as described and all questions were answered.   I spent in excess of 90 minutes during the conduct of this office consultation and >50% of this time involved direct face-to-face encounter with the patient for counseling and/or coordination of their care.     Valentina Gu. Roxy Manns, MD 05/01/2018 11:55 AM

## 2018-05-01 NOTE — Progress Notes (Signed)
HEART AND [place] [facility]GERY CONSULTATION REPORT  Referring Provider is Sherren Mocha, MD Primary Cardiologist is Park Liter, MD PCP is Garlon Hatchet Jaymes Graff, MD  Chief Complaint  Patient presents with  . Aortic Stenosis    Surgical eval for TAVR, review all studies  . 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  Patient states that he has known of a presence of a heart murmur for many years, having previously been told the presence of a heart murmur when he joined the [employer] as a young man.  He has been followed for the last several years by Dr. Agustin Cree.  Echocardiograms have documented the presence of aortic stenosis that has gradually progressed in severity.  He has a long-standing history of atrial fibrillation for which he has been anticoagulated using Xarelto.  In 2013 he underwent implantation of permanent pacemaker for complete heart block.  The patient states that over the past 2 to 3 years he has developed progressive symptoms of exertional shortness of breath and fatigue.  Recent follow-up echocardiogram revealed significant progression and severity of aortic stenosis with peak velocity across aortic valve measured 4.3 m/s corresponding to mean transvalvular gradient estimated 48 mmHg.  Left ventricular systolic function remains preserved with ejection fraction estimated 55 to 60%.  There was significant calcification of the mitral annulus but only  mild mitral regurgitation and no significant mitral stenosis.  Left and right heart catheterization performed March 28, 2018 by Dr. Tamala Julian confirmed the presence of aortic stenosis and revealed moderate coronary artery disease.  Peak to peak and mean transvalvular gradients across the aortic valve were measured 45 and 30 mmHg, respectively, corresponding to aortic valve area calculated 1.3 cm.  There was mild pulmonary hypertension.  The patient was referred to the multidisciplinary heart valve clinic and has been evaluated previously by Dr. Burt Knack.  CT angiography was performed and the patient referred for surgical consultation.  Patient is married and lives locally in [place] with his wife.  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  He has remained reasonably active physically and without any significant physical limitations.  However, the patient describes a long history of symptoms of mild exertional shortness of breath which have progressed since the initially over the past 2 to 3 years.  He now gets short of breath with low-level activity and is frequently limits his day-to-day activities.  When he gets periods of shortness of breath he also will feel tightness across his chest.  He denies any resting shortness of breath or chest discomfort.  He does occasionally get shortness of breath at night when he is sleeping and he is not comfortable laying flat in bed.  He reports occasional palpitations.  He has had some lower extremity edema.  He has not had any dizzy spells or syncope.  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  Past Medical History:  Diagnosis Date  . Atrial  fibrillation ([place])    Xarelto  . Diastolic heart failure ([place])   . Hyperlipidemia   . Hypertension   . Impaired glucose tolerance   . Pacemaker     Past Surgical  History:  Procedure Laterality Date  . CARDIAC CATHETERIZATION  2014   non-obs dz, done at [facility]  . CARDIOVERSION N/A 09/02/2017   Procedure: CARDIOVERSION;  Surgeon: Lelon Perla, MD;  Location: [facility] ENDOSCOPY;  Service: Cardiovascular;  Laterality: N/A;  . PACEMAKER IMPLANT  03/2012  . REPLACEMENT TOTAL KNEE Left   . RIGHT/LEFT HEART CATH AND CORONARY ANGIOGRAPHY N/A 03/28/2018   Procedure: RIGHT/LEFT HEART CATH AND CORONARY ANGIOGRAPHY;  Surgeon: Belva Crome, MD;  Location: [facility] LAB;  Service: Cardiovascular;  Laterality: N/A;  . VASECTOMY      Family History  Problem Relation Age of Onset  . CAD Father   . Stroke Father   . Colon cancer Sister     Social History   Socioeconomic History  . Marital status: Married    Spouse name: Not on file  . Number of children: Not on file  . Years of education: Not on file  . Highest education level: Not on file  Occupational History  . Occupation: retired  Scientific laboratory technician  . Financial resource strain: Not on file  . Food insecurity:    Worry: Not on file    Inability: Not on file  . Transportation needs:    Medical: Not on file    Non-medical: Not on file  Tobacco Use  . Smoking status: Former Smoker    Last attempt to quit: 1978    Years since quitting: 41.9  . Smokeless tobacco: Never Used  Substance and Sexual Activity  . Alcohol use: No    Frequency: Never  . Drug use: No  . Sexual activity: Not on file  Lifestyle  . Physical activity:    Days per week: Not on file    Minutes per session: Not on file  . Stress: Not on file  Relationships  . Social connections:    Talks on phone: Not on file    Gets together: Not on file    Attends religious service: Not on file    Active member of club or organization: Not on file    Attends meetings of clubs or organizations: Not on file    Relationship status: Not on file  . Intimate partner violence:    Fear of current or ex partner: Not on file     Emotionally abused: Not on file    Physically abused: Not on file    Forced sexual activity: Not on file  Other Topics Concern  . Not on file  Social History Narrative   Pt lives in [place]    Current Outpatient Medications  Medication Sig Dispense Refill  . acetaminophen (TYLENOL) 500 MG tablet Take 1,000 mg by mouth every 6 (six) hours as needed for moderate pain or headache.    Marland Kitchen amiodarone (PACERONE) 200 MG tablet Take 1 tablet (200 mg total) by mouth daily. 90 tablet 1  . metoprolol tartrate (LOPRESSOR) 25 MG tablet TAKE 1 TABLET BY MOUTH TWICE A DAY 60 tablet 2  . nitroGLYCERIN (NITROSTAT) 0.4 MG SL tablet Place 0.4 mg under the tongue every 5 (five) minutes as needed for chest pain.    . rivaroxaban (XARELTO) 20 MG TABS tablet Take 1 tablet (20 mg total) by mouth daily with supper. 90 tablet 2  . rosuvastatin (  CRESTOR) 20 MG tablet Take 20 mg by mouth at bedtime.    . tamsulosin (FLOMAX) 0.4 MG CAPS capsule Take 1 capsule (0.4 mg total) by mouth daily after supper. 30 capsule 0   No current facility-administered medications for this visit.     Allergies  Allergen Reactions  . Allopurinol Rash  . Atorvastatin Rash      Review of Systems:   General:  normal appetite, decreased energy, + weight gain, no weight loss, no fever  Cardiac:  + chest pain with exertion, no chest pain at rest, +SOB with exertion, occasional resting SOB, no PND, + orthopnea, + palpitations, + arrhythmia, + atrial fibrillation, + LE edema, no dizzy spells, no syncope  Respiratory:  + shortness of breath, no home oxygen, no productive cough, + dry cough, no bronchitis, no wheezing, trace hemoptysis, no asthma, no pain with inspiration or cough, no sleep apnea, no CPAP at night  GI:   no difficulty swallowing, no reflux, no frequent heartburn, no hiatal hernia, no abdominal pain, no constipation, no diarrhea, no hematochezia, no hematemesis, no melena  GU:   no dysuria,  + frequency, no urinary tract  infection, no hematuria, + enlarged prostate, + kidney stones, no kidney disease  Vascular:  no pain suggestive of claudication, no pain in feet, occasional leg cramps, no varicose veins, no DVT, no non-healing foot ulcer  Neuro:   no stroke, no TIA's, no seizures, no headaches, no temporary blindness one eye,  no slurred speech, no peripheral neuropathy, no chronic pain, no instability of gait, mild memory/cognitive dysfunction  Musculoskeletal: + arthritis, + joint swelling, + myalgias, minor difficulty walking, normal mobility   Skin:   no rash, no itching, no skin infections, no pressure sores or ulcerations  Psych:   no anxiety, no depression, no nervousness, no unusual recent stress  Eyes:   no blurry vision, no floaters, no recent vision changes, + wears glasses or contacts  ENT:   no hearing loss, no loose or painful teeth, + dentures, last saw dentist 2018  Hematologic:  no easy bruising, no abnormal bleeding, no clotting disorder, + frequent epistaxis  Endocrine:  no diabetes, does not check CBG's at home           Physical Exam:   BP 126/80 (BP Location: Right Arm, Patient Position: Sitting, Cuff Size: Large)   Pulse 85   Resp 16   Ht 5\' 10"  (1.778 m)   Wt 248 lb (112.5 kg)   SpO2 95% Comment: RA  BMI 35.58 kg/m   General:  Moderately obese,  well-appearing  HEENT:  Unremarkable   Neck:   no JVD, no bruits, no adenopathy   Chest:   clear to auscultation, symmetrical breath sounds, no wheezes, no rhonchi   CV:   RRR, grade III/VI crescendo/decrescendo murmur heard best at RSB,  no diastolic murmur  Abdomen:  soft, non-tender, no masses   Extremities:  warm, well-perfused, pulses diminished but palpable, no LE edema  Rectal/GU  Deferred  Neuro:   Grossly non-focal and symmetrical throughout  Skin:   Clean and dry, no rashes, no breakdown   Diagnostic Tests:  Transthoracic Echocardiography  Patient:    Srihith, Aquilino MR #:       [id number] Study Date:  03/20/2018 Gender:     M Age:        31 Height:     177.8 cm Weight:     113.9 kg BSA:  2.42 m^2 Pt. Status: Room:   Darliss Ridgel  ATTENDING    [name], MD  ORDERING     [name], MD  REFERRING    [name], MD  SONOGRAPHER  Jimmy Reel, RDCS  PERFORMING   [facility] [place]  cc:  ------------------------------------------------------------------- LV EF: 55% -   60%  ------------------------------------------------------------------- Indications:      Aortic stenosis 424.1.  ------------------------------------------------------------------- History:   PMH:   Atrial fibrillation.  Congestive heart failure. Moderate aortic stenosis.  Mild mitral stenosis.  Risk factors: Hypertension. Dyslipidemia.  ------------------------------------------------------------------- Study Conclusions  - Left ventricle: The cavity size was normal. Wall thickness was   increased in a pattern of moderate LVH. Systolic function was   normal. The estimated ejection fraction was in the range of 55%   to 60%. Wall motion was normal; there were no regional wall   motion abnormalities. Features are consistent with a pseudonormal   left ventricular filling pattern, with concomitant abnormal   relaxation and increased filling pressure (grade 2 diastolic   dysfunction). - Ventricular septum: The outflow septum had a sigmoid appearance. - Aortic valve: There was severe stenosis. Valve area (VTI): 1.05   cm^2. Valve area (Vmax): 1.16 cm^2. Valve area (Vmean): 1.04   cm^2. - Mitral valve: Calcified annulus. There was mild regurgitation.   Valve area by pressure half-time: 2.39 cm^2. Valve area by   continuity equation (using LVOT flow): 2.19 cm^2. - Left atrium: The atrium was severely dilated.  Impressions:  - Normal LVEF.   Moderate LVH.   Severe AS.   Mild MR, trace TR.   Severe LAE.   Pseudonormal transmitral flow  pattern.  ------------------------------------------------------------------- Labs, prior tests, procedures, and surgery: Permanent pacemaker system implantation.  ------------------------------------------------------------------- Study data:  No prior study was available for comparison.  Study status:  Routine.  Procedure:  The patient reported no pain pre or post test. Transthoracic echocardiography. Image quality was adequate.  Study completion:  There were no complications. Transthoracic echocardiography.  M-mode, complete 2D, spectral Doppler, and color Doppler.  Birthdate:  Patient birthdate: [birth year].  Age:  Patient is 77 yr old.  Sex:  Gender: male. BMI: 36 kg/m^2.  Blood pressure:     136/64  Patient status: Outpatient.  Study date:  Study date: 03/20/2018. Study time: 09:23 AM.  Location:  Echo laboratory.  -------------------------------------------------------------------  ------------------------------------------------------------------- Left ventricle:  The cavity size was normal. Wall thickness was increased in a pattern of moderate LVH. Systolic function was normal. The estimated ejection fraction was in the range of 55% to 60%. Wall motion was normal; there were no regional wall motion abnormalities. Features are consistent with a pseudonormal left ventricular filling pattern, with concomitant abnormal relaxation and increased filling pressure (grade 2 diastolic dysfunction).  ------------------------------------------------------------------- Aortic valve:   Trileaflet; severely thickened, severely calcified leaflets. Mobility was not restricted.  Doppler:   There was severe stenosis.   There was no regurgitation.    VTI ratio of LVOT to aortic valve: 0.3. Valve area (VTI): 1.05 cm^2. Indexed valve area (VTI): 0.43 cm^2/m^2. Peak velocity ratio of LVOT to aortic valve: 0.33. Valve area (Vmax): 1.16 cm^2. Indexed valve area (Vmax): 0.48 cm^2/m^2. Mean  velocity ratio of LVOT to aortic valve: 0.3. Valve area (Vmean): 1.04 cm^2. Indexed valve area (Vmean): 0.43 cm^2/m^2.    Mean gradient (S): 48 mm Hg. Peak gradient (S): 75 mm Hg.  ------------------------------------------------------------------- Aorta:  Aortic root: The aortic root was normal in size.  -------------------------------------------------------------------  Mitral valve:   Calcified annulus. Mobility was not restricted. Doppler:  Transvalvular velocity was within the normal range. There was no evidence for stenosis. There was mild regurgitation. Valve area by pressure half-time: 2.39 cm^2. Indexed valve area by pressure half-time: 0.99 cm^2/m^2. Valve area by continuity equation (using LVOT flow): 2.19 cm^2. Indexed valve area by continuity equation (using LVOT flow): 0.91 cm^2/m^2.    Mean gradient (D): 4 mm Hg. Peak gradient (D): 7 mm Hg.  ------------------------------------------------------------------- Left atrium:  The atrium was severely dilated.  ------------------------------------------------------------------- Right ventricle:  The cavity size was normal. Wall thickness was normal. Systolic function was normal.  ------------------------------------------------------------------- Ventricular septum:    The outflow septum had a sigmoid appearance.   ------------------------------------------------------------------- Pulmonic valve:    Structurally normal valve.   Cusp separation was normal.  Doppler:  Transvalvular velocity was within the normal range. There was no evidence for stenosis. There was trivial regurgitation.  ------------------------------------------------------------------- Tricuspid valve:   Structurally normal valve.    Doppler: Transvalvular velocity was within the normal range. There was mild regurgitation.  ------------------------------------------------------------------- Pulmonary artery:   The main pulmonary artery was  normal-sized. Systolic pressure was within the normal range.  ------------------------------------------------------------------- Right atrium:  The atrium was normal in size.  ------------------------------------------------------------------- Pericardium:  There was no pericardial effusion.  ------------------------------------------------------------------- Systemic veins: Inferior vena cava: The vessel was normal in size.  ------------------------------------------------------------------- Measurements   Left ventricle                           Value          Reference  LV ID, ED, PLAX chordal          (H)     57    mm       43 - 52  LV ID, ES, PLAX chordal          (H)     42    mm       23 - 38  LV fx shortening, PLAX chordal   (L)     26    %        >=29  LV PW thickness, ED                      14    mm       ----------  IVS/LV PW ratio, ED                      1              <=1.3  Stroke volume, 2D                        127   ml       ----------  Stroke volume/bsa, 2D                    53    ml/m^2   ----------  LV e&', lateral                           5.44  cm/s     ----------  LV E/e&', lateral                         24.63          ----------  LV e&', medial                            4.13  cm/s     ----------  LV E/e&', medial                          32.45          ----------  LV e&', average                           4.79  cm/s     ----------  LV E/e&', average                         28             ----------    Ventricular septum                       Value          Reference  IVS thickness, ED                        14    mm       ----------    LVOT                                     Value          Reference  LVOT ID, S                               21    mm       ----------  LVOT area                                3.46  cm^2     ----------  LVOT peak velocity, S                    145   cm/s     ----------  LVOT mean velocity, S                     98.3  cm/s     ----------  LVOT VTI, S                              36.6  cm       ----------  LVOT peak gradient, S                    8     mm Hg    ----------    Aortic valve                             Value          Reference  Aortic valve peak velocity, S            433   cm/s     ----------  Aortic valve mean velocity, S  328   cm/s     ----------  Aortic valve VTI, S                      121   cm       ----------  Aortic mean gradient, S                  48    mm Hg    ----------  Aortic peak gradient, S                  75    mm Hg    ----------  VTI ratio, LVOT/AV                       0.3            ----------  Aortic valve area, VTI                   1.05  cm^2     ----------  Aortic valve area/bsa, VTI               0.43  cm^2/m^2 ----------  Velocity ratio, peak, LVOT/AV            0.33           ----------  Aortic valve area, peak velocity         1.16  cm^2     ----------  Aortic valve area/bsa, peak              0.48  cm^2/m^2 ----------  velocity  Velocity ratio, mean, LVOT/AV            0.3            ----------  Aortic valve area, mean velocity         1.04  cm^2     ----------  Aortic valve area/bsa, mean              0.43  cm^2/m^2 ----------  velocity    Aorta                                    Value          Reference  Aortic root ID, ED                       33    mm       ----------  Ascending aorta ID, A-P, S               36    mm       ----------    Left atrium                              Value          Reference  LA ID, A-P, ES                           62    mm       ----------  LA ID/bsa, A-P                   (H)     2.57  cm/m^2   <=2.2  LA volume, S  130   ml       ----------  LA volume/bsa, S                         53.8  ml/m^2   ----------  LA volume, ES, 1-p A4C                   99.6  ml       ----------  LA volume/bsa, ES, 1-p A4C               41.2  ml/m^2   ----------  LA volume, ES, 1-p A2C                    158   ml       ----------  LA volume/bsa, ES, 1-p A2C               65.4  ml/m^2   ----------    Mitral valve                             Value          Reference  Mitral E-wave peak velocity              134   cm/s     ----------  Mitral A-wave peak velocity              132   cm/s     ----------  Mitral mean velocity, D                  90.5  cm/s     ----------  Mitral deceleration time         (H)     313   ms       150 - 230  Mitral pressure half-time                92    ms       ----------  Mitral mean gradient, D                  4     mm Hg    ----------  Mitral peak gradient, D                  7     mm Hg    ----------  Mitral E/A ratio, peak                   1              ----------  Mitral valve area, PHT, DP               2.39  cm^2     ----------  Mitral valve area/bsa, PHT, DP           0.99  cm^2/m^2 ----------  Mitral valve area, LVOT                  2.19  cm^2     ----------  continuity  Mitral valve area/bsa, LVOT              0.91  cm^2/m^2 ----------  continuity  Mitral annulus VTI, D                    57.8  cm       ----------  Tricuspid valve                          Value          Reference  Tricuspid regurg peak velocity           282   cm/s     ----------  Tricuspid peak RV-RA gradient            32    mm Hg    ----------    Right atrium                             Value          Reference  RA ID, S-I, ES, A4C              (H)     62.3  mm       34 - 49  RA area, ES, A4C                 (H)     22.8  cm^2     8.3 - 19.5  RA volume, ES, A/L                       70.2  ml       ----------  RA volume/bsa, ES, A/L                   29.1  ml/m^2   ----------    Right ventricle                          Value          Reference  TAPSE                                    26.3  mm       ----------  RV s&', lateral, S                        8.81  cm/s     ----------  Legend: (L)  and  (H)  mark values outside specified reference  range.  ------------------------------------------------------------------- Prepared and Electronically Authenticated by  [name], MD [id number]:45:56   RIGHT/LEFT HEART CATH AND CORONARY ANGIOGRAPHY  Conclusion    Moderately severe calcific aortic stenosis.  Peak to peak gradient 45 mmHg, mean gradient 30 mmHg, and calculated aortic valve area 1.3 cm based upon a cardiac output of 6.6 L/min/index 2.92 L/min.  Moderate proximal RCA and proximal to mid LAD bulky calcification with plaque up to 60%.  Second diagonal contains 70% stenosis but is a small vessel and management should be with medical therapy.  Calcified left main but no significant obstruction.  Circumflex is widely patent.  Left ventriculography was not performed.  LVEDP was 20.  Mild pulmonary hypertension with PA systolic pressure of 45 mmHg and mean pulmonary capillary wedge pressure 14 mmHg.  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  RECOMMENDATIONS:   Probably a reasonable candidate for TAVR for moderately severe aortic stenosis.  Dyspnea on exertion is his major complaint and there is likely contribution from chronic lung disease/asbestosis.  Valve treatment may not completely resolve dyspnea on exertion.  Recommend Aspirin 81mg  daily for moderate  CAD.  Indications   DOE (dyspnea on exertion) [R06.09 (ICD-10-CM)]  Aortic stenosis, severe [I35.0 (ICD-10-CM)]  Procedural Details   Technical Details The right radial area was sterilely prepped and draped. Intravenous sedation with Versed and fentanyl was administered. 1% Xylocaine was infiltrated to achieve local analgesia. Using real-time vascular ultrasound, a double wall stick with an angiocath was utilized to obtain intra-arterial access. A VUS image was saved for the permanent record.The modified Seldinger technique was used to place a 43F " Slender" sheath in the right radial artery. Weight based heparin was  administered. Coronary angiography was done using 5 F catheters. Right and left coronary angiography was performed with a JR4. Additional right coronary images were obtained with an EZ-Rad right 5 French coronary guide catheter. Left ventricular hemodymic recordings were made using the [facility] Rad right catheter. Left ventriculography was not performed.  Fluoroscopy demonstrated severe calcification of the right hemidiaphragm as well as right pleural calcification potentially consistent with asbestosis.  Right heart catheterization was performed by exchanging a previously placed antecubital IV angio-cath for a 5 French Slender sheath. 1% Xylocaine was used to locally nesthetize the area around the IV site. The IV catheter was wired using an .018 guidewire. The modified Seldinger technique was used to place the 5 [place] sheath. Double glove technique was used to enhance sterility. After sheath insertion, right heart cath was performed using a 5 French balloon tipped catheter and fluoroscopic guidance. Pressures were recorded in each chamber and in the pulmonary capillary wedge position.. The main pulmonary artery O2 saturation was sampled.   Hemostasis was achieved using a pneumatic band.  During this procedure the patient is administered a total of Versed 1.5 mg and Fentanyl 50 mg to achieve and maintain moderate conscious sedation. The patient's heart rate, blood pressure, and oxygen saturation are monitored continuously during the procedure. The period of conscious sedation is 56 minutes, of which I was present face-to-face 100% of this time.   Estimated blood loss <50 mL.  During this procedure the patient was administered the following to achieve and maintain moderate conscious sedation: Versed 1.5 mg, Fentanyl 50 mcg, while the patient's heart rate, blood pressure, and oxygen saturation were continuously monitored. The period of conscious sedation was 56 minutes, of which I was present face-to-face 100%  of this time.  Medications  (Filter: Administrations occurring from 03/28/18 0825 to 03/28/18 0945)  Medication Rate/Dose/Volume Action  Date Time   fentaNYL (SUBLIMAZE) injection (mcg) 25 mcg Given 03/28/18 0838   Total dose as of 05/01/18 1155 25 mcg Given 0904   50 mcg        midazolam (VERSED) injection (mg) 1 mg Given 03/28/18 0838   Total dose as of 05/01/18 1155 0.5 mg Given 0903   1.5 mg        Heparin (Porcine) in NaCl 1000-0.9 UT/500ML-% SOLN (mL) 500 mL Given 03/28/18 0850   Total dose as of 05/01/18 1155 500 mL Given 0850   1,000 mL        lidocaine (PF) (XYLOCAINE) 1 % injection (mL) 2 mL Given 03/28/18 0900   Total dose as of 05/01/18 1155 2 mL Given 0904   4 mL        Radial Cocktail/Verapamil only (mL) 10 mL Given 03/28/18 0902   Total dose as of 05/01/18 1155        10 mL        heparin injection (Units) 5,000 Units Given 03/28/18 0914   Total dose as of  05/01/18 1155        5,000 Units        iohexol (OMNIPAQUE) 350 MG/ML injection (mL) 60 mL Given 03/28/18 0930   Total dose as of 05/01/18 1155        60 mL        Sedation Time   Sedation Time Physician-1: 56 minutes 54 seconds  Coronary Findings   Diagnostic  Dominance: Right  Left Anterior Descending  Prox LAD lesion 60% stenosed  Prox LAD lesion is 60% stenosed.  First Diagonal Branch  Ost 1st Diag lesion 40% stenosed  Ost 1st Diag lesion is 40% stenosed.  Second Diagonal Branch  2nd Diag lesion 80% stenosed  2nd Diag lesion is 80% stenosed.  Left Circumflex  First Obtuse Marginal Branch  Vessel is large in size.  Right Coronary Artery  Prox RCA lesion 50% stenosed  Prox RCA lesion is 50% stenosed.  Intervention   No interventions have been documented.  Right Heart   Right Heart Pressures Hemodynamic findings consistent with pulmonary hypertension. Elevated LV EDP consistent with volume overload.  Right Atrium Right atrial pressure is normal.  Left Heart   Aortic Valve There is moderate  aortic valve stenosis. The aortic valve is calcified. There is restricted aortic valve motion.  Coronary Diagrams   Diagnostic  Dominance: Right    Intervention   Implants    No implant documentation for this case.  MERGE Images   Show images for CARDIAC CATHETERIZATION   Link to Procedure Log   Procedure Log    Hemo Data    Most Recent Value  Fick Cardiac Output 6.64 L/min  Fick Cardiac Output Index 2.92 (L/min)/BSA  Aortic Mean Gradient 29.54 mmHg  Aortic Peak Gradient 45 mmHg  Aortic Valve Area 1.31  Aortic Value Area Index 0.58 cm2/BSA  RA A Wave 7 mmHg  RA V Wave 7 mmHg  RA Mean 5 mmHg  RV Systolic Pressure 46 mmHg  RV Diastolic Pressure 6 mmHg  RV EDP 9 mmHg  PA Systolic Pressure 45 mmHg  PA Diastolic Pressure 14 mmHg  PA Mean 29 mmHg  PW A Wave 19 mmHg  PW V Wave 18 mmHg  PW Mean 14 mmHg  AO Systolic Pressure 202 mmHg  AO Diastolic Pressure 57 mmHg  AO Mean 80 mmHg  LV Systolic Pressure 542 mmHg  LV Diastolic Pressure 10 mmHg  LV EDP 20 mmHg  AOp Systolic Pressure 706 mmHg  AOp Diastolic Pressure 52 mmHg  AOp Mean Pressure 77 mmHg  LVp Systolic Pressure 237 mmHg  LVp Diastolic Pressure 13 mmHg  LVp EDP Pressure 22 mmHg  QP/QS 1  TPVR Index 9.93 HRUI  TSVR Index 27.4 HRUI  PVR SVR Ratio 0.2  TPVR/TSVR Ratio 0.36    Cardiac TAVR CT  TECHNIQUE: The patient was scanned on a [employer]. A 120 kV retrospective scan was triggered in the descending thoracic aorta at 111 HU's. Gantry rotation speed was 250 msecs and collimation was .6 mm. No beta blockade or nitro were given. The 3D data set was reconstructed in 5% intervals of the R-R cycle. Systolic and diastolic phases were analyzed on a dedicated work station using MPR, MIP and VRT modes. The patient received 80 cc of contrast.  FINDINGS: Aortic Valve: Trileaflet aortic valve with severely thickened and calcified leaflets and severely restricted leaflet opening. There are  moderate calcifications extending into the LVOT from under the right and left coronary sinus and continuing into the mitral  annular calcifications.  Aorta: Normal size with trivial atheroma ad calcifications. No dissection.  Sinotubular Junction: 32 x 31 mm  Ascending Thoracic Aorta: 35 x 34 mm  Aortic Arch: 34 x 31 mm  Descending Thoracic Aorta: 27 x 26 mm  Sinus of Valsalva Measurements:  Non-coronary: 35 mm  Right -coronary: 34 mm  Left -coronary: 36 mm  Coronary Artery Height above Annulus:  Left Main: 15 mm  Right Coronary: 18 mm  Virtual Basal Annulus Measurements:  Maximum/Minimum Diameter: 28.6 x 21.1 mm  Mean Diameter: 24.4 mm  Perimeter: 78.6 mm  Area: 468 mm2  Optimum Fluoroscopic Angle for Delivery:  LAO 2 CAU 2  IMPRESSION: 1. Trileaflet aortic valve with severely thickened and calcified leaflets and severely restricted leaflet opening. There are moderate calcifications extending into the LVOT from under the right and left coronary sinus and continuing into the mitral annular calcifications. Annular measurement suitable for delivery of a 26 mm Edwards-SAPIEN 3 valve.  2. Sufficient coronary to annulus distance.  3. Optimum Fluoroscopic Angle for Delivery: LAO 2 CAU 2  4. No thrombus in the left atrial appendage.  5. Mildly dilated pulmonary artery measuring 31 mm.  6. Atrial and ventricular leads are present.   Electronically Signed   By: Ena Dawley   On: 04/25/2018 14:20   CT ANGIOGRAPHY CHEST, ABDOMEN AND PELVIS  TECHNIQUE: Multidetector CT imaging through the chest, abdomen and pelvis was performed using the standard protocol during bolus administration of intravenous contrast. Multiplanar reconstructed images and MIPs were obtained and reviewed to evaluate the vascular anatomy.  CONTRAST:  15mL ISOVUE-370 IOPAMIDOL (ISOVUE-370) INJECTION 76%  COMPARISON:  Chest CT 01/17/2018. CT the  abdomen and pelvis 05/16/2017.  FINDINGS: CTA CHEST FINDINGS  Cardiovascular: Heart size is mildly enlarged. Small amount of pericardial fluid in a superior pericardial recess adjacent to the ascending thoracic aorta incidentally noted. No other significant pericardial fluid, thickening or pericardial calcification. There is aortic atherosclerosis, as well as atherosclerosis of the great vessels of the mediastinum and the coronary arteries, including calcified atherosclerotic plaque in the left anterior descending, left circumflex and right coronary arteries. Severe calcifications of the aortic valve. Severe calcifications of the mitral annulus. Left-sided pacemaker device in place with lead tips terminating in the right atrium and right ventricular apex.  Mediastinum/Lymph Nodes: No pathologically enlarged mediastinal or hilar lymph nodes. Esophagus is unremarkable in appearance. No axillary lymphadenopathy.  Lungs/Pleura: Pleural thickening and multiple calcified pleural plaques in the right hemithorax, likely sequela of remote infection. No calcified pleural plaques are noted in the left hemithorax. Patchy areas of ground-glass attenuation and interlobular septal thickening are noted throughout the lungs bilaterally, favored to reflect a background of interstitial pulmonary edema. No consolidative airspace disease. No definite suspicious appearing pulmonary nodules or masses are noted.  Musculoskeletal/Soft Tissues: There are no aggressive appearing lytic or blastic lesions noted in the visualized portions of the skeleton.  CTA ABDOMEN AND PELVIS FINDINGS  Hepatobiliary: No suspicious cystic or solid hepatic lesions. No intra or extrahepatic biliary ductal dilatation. Several tiny partially calcified gallstones lie dependently in the gallbladder. No findings to suggest an acute cholecystitis at this time.  Pancreas: Diffuse fatty atrophy throughout the pancreas.  No pancreatic mass. No pancreatic ductal dilatation. No pancreatic or peripancreatic fluid or inflammatory changes.  Spleen: Unremarkable.  Adrenals/Urinary Tract: Bilateral kidneys and bilateral adrenal glands are normal in appearance. No hydroureteronephrosis. Urinary bladder is normal in appearance.  Stomach/Bowel: Normal appearance of the stomach. No pathologic dilatation of  small bowel or colon. Normal appendix.  Vascular/Lymphatic: Minimal aortic atherosclerosis, without evidence of aneurysm or dissection in the abdominal or pelvic vasculature. Vascular findings and measurements pertinent to potential TAVR procedure, as detailed below.  Reproductive: Prostate gland and seminal vesicles are unremarkable in appearance.  Other: No significant volume of ascites.  No pneumoperitoneum.  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 There are no other more aggressive appearing lytic or blastic lesions noted in the visualized portions of the skeleton.  VASCULAR MEASUREMENTS PERTINENT TO TAVR:  AORTA:  Minimal Aortic Diameter-17 x 18 mm  Severity of Aortic Calcification-minimal  RIGHT PELVIS:  Right Common Iliac Artery -  Minimal Diameter-10.4 x 8.7 mm  Tortuosity-mild  Calcification - none  Right External Iliac Artery -  Minimal Diameter-8.9 x 7.1 mm  Tortuosity-mild-to-moderate  Calcification - none  Right Common Femoral Artery -  Minimal Diameter-7.9 x 7.9 mm  Tortuosity-mild  Calcification - none  LEFT PELVIS:  Left Common Iliac Artery -  Minimal Diameter-10.8 x 10.1 mm  Tortuosity-mild  Calcification - none  Left External Iliac Artery -  Minimal Diameter-7.7 x 8.3 mm  Tortuosity-mild-to-moderate  Calcification - none  Left Common Femoral Artery -  Minimal Diameter-9.5 x 8.6  mm  Tortuosity-mild  Calcification-none  Review of the MIP images confirms the above findings.  IMPRESSION: 1. Vascular findings and measurements pertinent to potential TAVR procedure, as detailed above. 2. Severe thickening calcification of the aortic valve, compatible with the reported clinical history of severe aortic stenosis. 3. Cardiomegaly with evidence of interstitial pulmonary edema in the lungs, concerning for congestive heart failure. 4. Aortic atherosclerosis, in addition to left main and 3 vessel coronary artery disease. Assessment for potential risk factor modification, dietary therapy or pharmacologic therapy may be warranted, if clinically indicated. 5. There are also severe calcifications of the mitral annulus. 6. Mild pleural thickening and calcified pleural plaques in the right hemithorax, likely sequela of remote right-sided infection. 7. Additional incidental findings, as above.   Electronically Signed   By: Vinnie Langton M.D.   On: 04/25/2018 10:19  Pulmonary Function Tests  Baseline      Post-bronchodilator  FVC  2.68 L  (63% predicted) FVC  1.89 L  (44% predicted) FEV1  2.26 L  (74% predicted) FEV1  1.74 L  (57% predicted) FEF25-75 2.86 L  (131% predicted)   TLC  4.51 L  (64% predicted) RV  1.64 L  (63% predicted) DLCO  39% predicted   STS Risk Calculator  Procedure: AVR + CAB CALCULATE   Risk of Mortality:  4.609% Renal Failure:  6.032% Permanent Stroke:  1.651% Prolonged Ventilation:  17.981% DSW Infection:  0.483% Reoperation:  6.735% Morbidity or Mortality:  22.941% Short Length of Stay:  15.152% Long Length of Stay:  15.635%     Impression:  Patient has stage D severe symptomatic aortic stenosis, multivessel coronary artery disease, long-standing atrial fibrillation and 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  He presents with progressive symptoms of exertional shortness of breath and  fatigue consistent with chronic diastolic congestive heart failure, New York Heart Association functional class III.  The patient also experiences symptoms of tightness across his chest when he is short of breath, possibly related to congestive heart failure but also possibly consistent with angina pectoris.  Symptoms have progressed over the last 2 years and now limit the patient's physical activity considerably.  I have  personally reviewed the patient's recent transthoracic echocardiogram, diagnostic cardiac catheterization, and CT angiograms.  Echocardiogram demonstrates the presence of severe aortic stenosis.  The aortic valve is trileaflet with severe thickening, calcification, and restricted leaflet mobility involving all 3 leaflets of the aortic valve.  Peak velocity across aortic valve measured 4.3 m/s corresponding to mean transvalvular gradient estimated 48 mmHg.  Left ventricular systolic function remains reasonably well preserved.  Diagnostic cardiac catheterization reveals moderate multivessel coronary artery disease.  There is probably at least 60% stenosis of the proximal right coronary artery, although it is unclear whether or not this lesion is flow-limiting.  There is 70% proximal stenosis in a diagonal branch, but there does not appear to be significant stenosis in the left anterior descending coronary artery.   I agree the patient needs aortic valve replacement.  Is unclear whether or not the proximal stenosis in the right coronary artery is flow-limiting.  However, I would recommend concomitant coronary artery bypass grafting to the right coronary artery if conventional surgical aortic valve replacement were to be planned.  The patient might also benefit from a concomitant Maze procedure.  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  Cardiac-gated CTA of the heart reveals anatomical characteristics consistent with aortic stenosis suitable for treatment by transcatheter aortic valve replacement without any significant complicating features, although there are some areas of calcification in the aortic annulus which might slightly increased risk of paravalvular leak.  CTA of the aorta and iliac vessels demonstrate what appears to be adequate pelvic vascular access to facilitate a transfemoral approach.    Plan:  The patient and his wife were counseled at length regarding treatment alternatives for management of severe symptomatic aortic stenosis. Alternative approaches such as conventional aortic valve replacement, transcatheter aortic valve replacement, and continued medical therapy without intervention were compared and contrasted at length.  The risks associated with conventional surgical aortic valve replacement were discussed in detail, as were expectations for post-operative convalescence.  Issues specific to transcatheter aortic valve replacement were discussed including questions about long term valve durability, the potential for paravalvular leak, possible increased risk of need for permanent pacemaker placement, and other technical complications related to the procedure itself.  Long-term prognosis with medical therapy was discussed. This discussion was placed in the context of the patient's own specific clinical presentation and past medical history.  All of their questions have been addressed.  The patient desires to proceed with TAVR in the near future.  We tentatively plan for surgery on Tuesday, May 09, 2018.  Following the decision to proceed with transcatheter aortic valve replacement, a discussion has been held regarding what types of management strategies would be attempted intraoperatively in the event of life-threatening complications,  including whether or not the patient would be considered a candidate for the use of cardiopulmonary bypass and/or conversion to open sternotomy for attempted surgical intervention.  The patient has been advised of a variety of complications that might develop including but not limited to risks of death, stroke, paravalvular leak, aortic dissection or other major vascular complications, aortic annulus rupture, device embolization, cardiac rupture or perforation, mitral regurgitation, acute myocardial infarction, arrhythmia, heart block or bradycardia requiring permanent pacemaker placement, congestive heart failure, respiratory failure, renal failure, pneumonia, infection, other late complications related to structural valve deterioration or migration, or  other complications that might ultimately cause a temporary or permanent loss of functional independence or other long term morbidity.  The patient provides full informed consent for the procedure as described and all questions were answered.   I spent in excess of 90 minutes during the conduct of this office consultation and >50% of this time involved direct face-to-face encounter with the patient for counseling and/or coordination of their care.     Valentina Gu. Roxy Manns, MD 05/01/2018 11:55 AM

## 2018-05-01 NOTE — Therapy (Signed)
[place], [place], [address] Phone: [phone]   Fax:  [phone]  Physical Therapy Evaluation/TAVR Evaluation  Patient Details  Name: Frederick Vasquez MRN: [id number] Date of Birth: [birth year] Referring Provider (PT): Sherren Mocha, MD   Encounter Date: 05/01/2018  PT End of Session - 05/01/18 0931    Visit Number  1    PT Start Time  0930    PT Stop Time  1000    PT Time Calculation (min)  30 min    Equipment Utilized During Treatment  Gait belt    Activity Tolerance  Patient tolerated treatment well    Behavior During Therapy  [facility] for tasks assessed/performed       Past Medical History:  Diagnosis Date  . Atrial fibrillation ([place])    Xarelto  . Diastolic heart failure ([place])   . Hyperlipidemia   . Hypertension   . Impaired glucose tolerance   . Pacemaker     Past Surgical History:  Procedure Laterality Date  . CARDIAC CATHETERIZATION  2014   non-obs dz, done at [facility]  . CARDIOVERSION N/A 09/02/2017   Procedure: CARDIOVERSION;  Surgeon: Lelon Perla, MD;  Location: [facility] ENDOSCOPY;  Service: Cardiovascular;  Laterality: N/A;  . PACEMAKER IMPLANT  03/2012  . REPLACEMENT TOTAL KNEE Left   . RIGHT/LEFT HEART CATH AND CORONARY ANGIOGRAPHY N/A 03/28/2018   Procedure: RIGHT/LEFT HEART CATH AND CORONARY ANGIOGRAPHY;  Surgeon: Belva Crome, MD;  Location: [facility] LAB;  Service: Cardiovascular;  Laterality: N/A;  . VASECTOMY      There were no vitals filed for this visit.   Subjective Assessment - 05/01/18 0932    Subjective  Denies any pain, just run out of breath. Began a few years ago but has progressed. Can walk 10-15 minutes on level surfaces, can take a standing rest break and begin again. Denies regular exercise.     Currently in Pain?  No/denies         [facility] PT Assessment - 05/01/18 0001      Assessment   Medical Diagnosis  severe aortic stenosis    Referring  Provider (PT)  Sherren Mocha, MD    Hand Dominance  Right      Precautions   Precaution Comments  pacemaker      Restrictions   Weight Bearing Restrictions  No      Balance Screen   Has the patient fallen in the past 6 months  No      [place] residence    Living Arrangements  Spouse/significant other      Prior Function   Level of [place]  Retired      Associate Professor   Overall Cognitive Status  Within Functional Limits for tasks assessed   hard of hearing- hearing aids     Sensation   Additional Comments  WFL      ROM / Strength   AROM / PROM / Strength  AROM;Strength      AROM   Overall AROM Comments  WFL      Strength   Overall Strength Comments  gross 5/5, except GHJ ER 4/5    Strength Assessment Site  Hand    Right/Left hand  Right;Left    Right Hand Grip (lbs)  65    Left Hand Grip (lbs)  45       [facility] Pre-Surgical Assessment - 05/01/18  0001    5 Meter Walk Test- trial 1  3 sec    5 Meter Walk Test- trial 2  4 sec.     5 Meter Walk Test- trial 3  5 sec.    5 meter walk test average  4 sec    4 Stage Balance Test Position  3    Sit To Stand Test- trial 1  13 sec.    Comment  press single arm from knee on Lt side    6 Minute Walk- Baseline  yes    BP (mmHg)  133/71    HR (bpm)  66    02 Sat (%RA)  95 %    Modified Borg Scale for Dyspnea  0- Nothing at all    Perceived Rate of Exertion (Borg)  6-    6 Minute Walk Post Test  yes    BP (mmHg)  (!) 165/94    HR (bpm)  89    02 Sat (%RA)  96 %    Modified Borg Scale for Dyspnea  7- Severe shortness of breath or very hard breathing    Perceived Rate of Exertion (Borg)  13- Somewhat hard    Aerobic Endurance Distance Walked  960    Endurance additional comments  c/o chest tightness post test that resolved before leaving              Objective measurements completed on examination: See above findings.                            Plan - 05/01/18 1005    PT Frequency  --   one time TAVR evaluation   Consulted and Agree with Plan of Care  Patient;Family member/caregiver    Family Member Consulted  Wife     Clinical Impression Statement: Pt is a 77 yo M presenting to OP PT for evaluation prior to possible TAVR surgery due to severe aortic stenosis. Pt reports onset of SOB a few years ago that has progressively worsened. Symptoms are limiting community ambulation. Pt presents with WFL ROM and strength and denies musculoskeletal pain. Pt ambulated 505 feet in 4:21 before requesting a standingrest break lasting 30s and 960 ft in 5:47 requiring a standing rest break lasting 20s(past the end of the 6 minutes). At time of rest patient's HR was 101 bpm and O2 was 96 on room air. Pt reported 7/10 shortness of breath on modified scale for dyspnea. Pt able to resume after rest.. Pt ambulated a total of 960 feet in 6 minute walk and reported 7/10 SOB on modified scale for dyspena and 13/20 RPE on Borg's perceived exertion and pain scale at the end of the walk. During the 6 minute walk test, patient's HR increased to 101 and O2 saturation decreased to 96. Based on the Short Physical Performance Battery, patient has a frailty rating of 10/12 with </= 5/12 considered frail.    Visit Diagnosis: Difficulty in walking, not elsewhere classified     Problem List Patient Active Problem List   Diagnosis Date Noted  . Persistent atrial fibrillation 08/05/2017  . Chronic anticoagulation 06/14/2017  . Normal coronary arteries 06/14/2017  . Epistaxis 05/15/2017  . Aortic stenosis 05/15/2017  . Elevated troponin 05/15/2017  . PAF (paroxysmal atrial fibrillation) ([place]) 05/15/2017  . Chronic diastolic CHF (congestive heart failure) ([place]) 05/15/2017  . Essential hypertension 05/15/2017  . Hyperlipidemia 05/15/2017  . Impaired glucose tolerance 05/15/2017  . Thrombocytopenia (  [place]) 05/15/2017   . Severe malnutrition ([place]) 05/15/2017  . Sepsis ([place]) 05/14/2017  . AV block, complete ([place]) 07/29/2016  . Dyslipidemia 11/19/2014  . Pacemaker reprogramming/check 11/19/2014  . Presence of cardiac pacemaker 11/19/2014    Tyah Acord C. Shalon Salado PT, DPT 05/01/18 10:12 AM   [facility] [address] [place], [place], [address] Phone: [phone]   Fax:  [phone]  Name: Frederick Vasquez MRN: [id number] Date of Birth: [birth year]

## 2018-05-04 NOTE — Pre-Procedure Instructions (Signed)
ELIBERTO SOLE  05/04/2018      [id number] - [place], [place] [address] [place] [address] Phone: [phone] Fax: [phone]    Your procedure is scheduled on December 17th.  Report to [facility] Admitting at 10:15 A.M.  Call this number if you have problems the morning of surgery:  [phone]   Remember:  Do not eat or drink after midnight.     Take these medicines the morning of surgery with A SIP OF WATER    Tylenol - if needed  Metoprolol   Follow your surgeon's instructions on when to stop Xarelto.  If no instructions were given by your surgeon then you will need to call the office to get those instructions.    7 days prior to surgery STOP taking any Aspirin(unless otherwise instructed by your surgeon), Aleve, Naproxen, Ibuprofen, Motrin, Advil, Goody's, BC's, all herbal medications, fish oil, and all vitamins     Do not wear jewelry.  Do not wear lotions, powders, or perfumes, or deodorant.  Do not shave 48 hours prior to surgery.  Men may shave face and neck.  Do not bring valuables to the hospital.  [facility] is not responsible for any belongings or valuables.   [facility]- Preparing For Surgery  Before surgery, you can play an important role. Because skin is not sterile, your skin needs to be as free of germs as possible. You can reduce the number of germs on your skin by washing with CHG (chlorahexidine gluconate) Soap before surgery.  CHG is an antiseptic cleaner which kills germs and bonds with the skin to continue killing germs even after washing.    Oral Hygiene is also important to reduce your risk of infection.  Remember - BRUSH YOUR TEETH THE MORNING OF SURGERY WITH YOUR REGULAR TOOTHPASTE  Please do not use if you have an allergy to CHG or antibacterial soaps. If your skin becomes reddened/irritated stop using the CHG.  Do not shave (including legs and underarms) for at least  48 hours prior to first CHG shower. It is OK to shave your face.  Please follow these instructions carefully.   1. Shower the NIGHT BEFORE SURGERY and the MORNING OF SURGERY with CHG.   2. If you chose to wash your hair, wash your hair first as usual with your normal shampoo.  3. After you shampoo, rinse your hair and body thoroughly to remove the shampoo.  4. Use CHG as you would any other liquid soap. You can apply CHG directly to the skin and wash gently with a scrungie or a clean washcloth.   5. Apply the CHG Soap to your body ONLY FROM THE NECK DOWN.  Do not use on open wounds or open sores. Avoid contact with your eyes, ears, mouth and genitals (private parts). Wash Face and genitals (private parts)  with your normal soap.  6. Wash thoroughly, paying special attention to the area where your surgery will be performed.  7. Thoroughly rinse your body with warm water from the neck down.  8. DO NOT shower/wash with your normal soap after using and rinsing off the CHG Soap.  9. Pat yourself dry with a CLEAN TOWEL.  10. Wear CLEAN PAJAMAS to bed the night before surgery, wear comfortable clothes the morning of surgery  11. Place CLEAN SHEETS on your bed the night of your first shower and DO NOT SLEEP WITH  PETS.   Day of Surgery:  Do not apply any deodorants/lotions.  Please wear clean clothes to the hospital/surgery center.   Remember to brush your teeth WITH YOUR REGULAR TOOTHPASTE.   Contacts, dentures or bridgework may not be worn into surgery.  Leave your suitcase in the car.  After surgery it may be brought to your room.  For patients admitted to the hospital, discharge time will be determined by your treatment team.  Patients discharged the day of surgery will not be allowed to drive home.   Please read over the following fact sheets that you were given. Coughing and Deep Breathing and Surgical Site Infection Prevention

## 2018-05-05 ENCOUNTER — Inpatient Hospital Stay (HOSPITAL_COMMUNITY)
Admission: RE | Admit: 2018-05-05 | Discharge: 2018-05-05 | Disposition: A | Discharge status: Home / Self Care | Payer: Medicare Other | Source: Ambulatory Visit

## 2018-05-09 ENCOUNTER — Other Ambulatory Visit (HOSPITAL_COMMUNITY): Payer: Self-pay | Admitting: *Deleted

## 2018-05-10 ENCOUNTER — Encounter (HOSPITAL_COMMUNITY)
Admission: RE | Admit: 2018-05-10 | Discharge: 2018-05-10 | Disposition: A | Discharge status: Home / Self Care | Payer: Medicare Other | Source: Ambulatory Visit | Attending: Cardiovascular Disease | Admitting: Cardiovascular Disease

## 2018-05-10 ENCOUNTER — Other Ambulatory Visit: Payer: Self-pay

## 2018-05-10 ENCOUNTER — Encounter (HOSPITAL_COMMUNITY): Payer: Self-pay

## 2018-05-10 ENCOUNTER — Ambulatory Visit (HOSPITAL_COMMUNITY)
Admission: RE | Admit: 2018-05-10 | Discharge: 2018-05-10 | Disposition: A | Discharge status: Home / Self Care | Payer: Medicare Other | Source: Ambulatory Visit | Attending: Cardiovascular Disease | Admitting: Cardiovascular Disease

## 2018-05-10 DIAGNOSIS — I350 Nonrheumatic aortic (valve) stenosis: Secondary | ICD-10-CM

## 2018-05-10 HISTORY — DX: Unspecified osteoarthritis, unspecified site: M19.90

## 2018-05-10 HISTORY — DX: Cardiac arrhythmia, unspecified: I49.9

## 2018-05-10 LAB — URINALYSIS, ROUTINE W REFLEX MICROSCOPIC
Bilirubin Urine: NEGATIVE
Glucose, UA: NEGATIVE mg/dL
Hgb urine dipstick: NEGATIVE
Ketones, ur: NEGATIVE mg/dL
LEUKOCYTES UA: NEGATIVE
Nitrite: NEGATIVE
Protein, ur: NEGATIVE mg/dL
Specific Gravity, Urine: 1.021 (ref 1.005–1.030)
pH: 5 (ref 5.0–8.0)

## 2018-05-10 LAB — TYPE AND SCREEN
ABO/RH(D): A NEG
Antibody Screen: NEGATIVE

## 2018-05-10 LAB — CBC
HEMATOCRIT: 39.3 % (ref 39.0–52.0)
Hemoglobin: 12.8 g/dL — ABNORMAL LOW (ref 13.0–17.0)
MCH: 30.8 pg (ref 26.0–34.0)
MCHC: 32.6 g/dL (ref 30.0–36.0)
MCV: 94.7 fL (ref 80.0–100.0)
Platelets: 138 10*3/uL — ABNORMAL LOW (ref 150–400)
RBC: 4.15 MIL/uL — ABNORMAL LOW (ref 4.22–5.81)
RDW: 13.2 % (ref 11.5–15.5)
WBC: 4.4 10*3/uL (ref 4.0–10.5)
nRBC: 0 % (ref 0.0–0.2)

## 2018-05-10 LAB — SURGICAL PCR SCREEN
MRSA, PCR: NEGATIVE
Staphylococcus aureus: NEGATIVE

## 2018-05-10 LAB — COMPREHENSIVE METABOLIC PANEL
ALT: 51 U/L — ABNORMAL HIGH (ref 0–44)
AST: 63 U/L — ABNORMAL HIGH (ref 15–41)
Albumin: 3.9 g/dL (ref 3.5–5.0)
Alkaline Phosphatase: 59 U/L (ref 38–126)
Anion gap: 10 (ref 5–15)
BILIRUBIN TOTAL: 0.7 mg/dL (ref 0.3–1.2)
BUN: 18 mg/dL (ref 8–23)
CO2: 23 mmol/L (ref 22–32)
Calcium: 9.1 mg/dL (ref 8.9–10.3)
Chloride: 103 mmol/L (ref 98–111)
Creatinine, Ser: 0.99 mg/dL (ref 0.61–1.24)
GFR calc Af Amer: >60 mL/min (ref >60)
GFR calc non Af Amer: >60 mL/min (ref >60)
Glucose, Bld: 100 mg/dL — ABNORMAL HIGH (ref 70–99)
Potassium: 3.9 mmol/L (ref 3.5–5.1)
Sodium: 136 mmol/L (ref 135–145)
TOTAL PROTEIN: 8 g/dL (ref 6.5–8.1)

## 2018-05-10 LAB — APTT: aPTT: 30 seconds (ref 24–36)

## 2018-05-10 LAB — HEMOGLOBIN A1C
Hgb A1c MFr Bld: 5.3 % (ref 4.8–5.6)
Mean Plasma Glucose: 105.41 mg/dL

## 2018-05-10 LAB — BRAIN NATRIURETIC PEPTIDE: B Natriuretic Peptide: 257 pg/mL — ABNORMAL HIGH (ref 0.0–100.0)

## 2018-05-10 LAB — PROTIME-INR
INR: 1.15
Prothrombin Time: 14.6 seconds (ref 11.4–15.2)

## 2018-05-10 LAB — ABO/RH: ABO/RH(D): A NEG

## 2018-05-10 NOTE — Progress Notes (Signed)
PCP - Teressa Lower MD Cardiologist - Dr. Agustin Cree  Chest x-ray - 05/10/18 EKG - 05/10/18 ECHO - 2019 Cardiac Cath - 2019  Blood Thinner Instructions: Xarelto last dose 12/12 Aspirin Instructions: N/A  Anesthesia review: cardiac hx  Patient denies shortness of breath, fever, cough and chest pain at PAT appointment   Patient verbalized understanding of instructions that were given to them at the PAT appointment. Patient was also instructed that they will need to review over the PAT instructions again at home before surgery.

## 2018-05-10 NOTE — Progress Notes (Signed)
   05/10/18 1308  OBSTRUCTIVE SLEEP APNEA  Have you ever been diagnosed with sleep apnea through a sleep study? No  Do you snore loudly (loud enough to be heard through closed doors)?  0  Has anyone observed you stop breathing during your sleep? 0  Do you have, or are you being treated for high blood pressure? 1  BMI more than 35 kg/m2? 1  Age > 50 (1-yes) 1  Neck circumference greater than:Male 16 inches or larger, Male 17inches or larger? 1  Male Gender (Yes=1) 1  Obstructive Sleep Apnea Score 5  Score 5 or greater  Results sent to PCP

## 2018-05-11 MED ORDER — INSULIN REGULAR(HUMAN) IN NACL 100-0.9 UT/100ML-% IV SOLN
INTRAVENOUS | Status: DC
  Filled 2018-05-11: qty 100

## 2018-05-11 MED ORDER — POTASSIUM CHLORIDE 2 MEQ/ML IV SOLN
80.0000 meq | INTRAVENOUS | Status: DC
  Filled 2018-05-11: qty 40

## 2018-05-11 MED ORDER — DOPAMINE-DEXTROSE 3.2-5 MG/ML-% IV SOLN
0.0000 ug/kg/min | INTRAVENOUS | Status: DC
  Filled 2018-05-11: qty 250

## 2018-05-11 MED ORDER — PHENYLEPHRINE HCL-NACL 20-0.9 MG/250ML-% IV SOLN
30.0000 ug/min | INTRAVENOUS | Status: DC
  Filled 2018-05-11: qty 250

## 2018-05-11 MED ORDER — VANCOMYCIN HCL 10 G IV SOLR
1500.0000 mg | INTRAVENOUS | Status: AC
  Administered 2018-05-12: 1500 mg via INTRAVENOUS
  Filled 2018-05-11: qty 1500

## 2018-05-11 MED ORDER — NITROGLYCERIN IN D5W 200-5 MCG/ML-% IV SOLN
2.0000 ug/min | INTRAVENOUS | Status: DC
  Filled 2018-05-11: qty 250

## 2018-05-11 MED ORDER — DEXMEDETOMIDINE HCL IN NACL 400 MCG/100ML IV SOLN
0.1000 ug/kg/h | INTRAVENOUS | Status: AC
  Administered 2018-05-12: 1 ug/kg/h via INTRAVENOUS
  Filled 2018-05-11: qty 100

## 2018-05-11 MED ORDER — EPINEPHRINE PF 1 MG/ML IJ SOLN
0.0000 ug/min | INTRAVENOUS | Status: DC
  Filled 2018-05-11: qty 4

## 2018-05-11 MED ORDER — SODIUM CHLORIDE 0.9 % IV SOLN
INTRAVENOUS | Status: DC
  Filled 2018-05-11: qty 30

## 2018-05-11 MED ORDER — MAGNESIUM SULFATE 50 % IJ SOLN
40.0000 meq | INTRAMUSCULAR | Status: DC
  Filled 2018-05-11: qty 9.85

## 2018-05-11 MED ORDER — SODIUM CHLORIDE 0.9 % IV SOLN
1.5000 g | INTRAVENOUS | Status: AC
  Administered 2018-05-12: 1.5 g via INTRAVENOUS
  Filled 2018-05-11: qty 1.5

## 2018-05-11 MED ORDER — NOREPINEPHRINE BITARTRATE 1 MG/ML IV SOLN
0.0000 ug/min | INTRAVENOUS | Status: DC
  Filled 2018-05-11: qty 4

## 2018-05-12 ENCOUNTER — Inpatient Hospital Stay (HOSPITAL_COMMUNITY)
Admission: RE | Admit: 2018-05-12 | Discharge: 2018-05-13 | DRG: 267 | Disposition: A | Discharge status: Home / Self Care | Payer: Medicare Other | Attending: Cardiovascular Disease | Admitting: Cardiovascular Disease

## 2018-05-12 ENCOUNTER — Ambulatory Visit (HOSPITAL_COMMUNITY): Payer: Medicare Other

## 2018-05-12 ENCOUNTER — Other Ambulatory Visit: Payer: Self-pay

## 2018-05-12 ENCOUNTER — Encounter (HOSPITAL_COMMUNITY)
Admission: RE | Disposition: A | Discharge status: Home / Self Care | Payer: Self-pay | Source: Home / Self Care | Attending: Cardiovascular Disease

## 2018-05-12 ENCOUNTER — Encounter (HOSPITAL_COMMUNITY): Payer: Self-pay | Admitting: Certified Registered Nurse Anesthetist

## 2018-05-12 ENCOUNTER — Inpatient Hospital Stay (HOSPITAL_COMMUNITY): Payer: Medicare Other | Admitting: Anesthesiology

## 2018-05-12 ENCOUNTER — Inpatient Hospital Stay (HOSPITAL_COMMUNITY): Payer: Medicare Other | Admitting: Physician Assistant

## 2018-05-12 ENCOUNTER — Inpatient Hospital Stay (HOSPITAL_COMMUNITY): Payer: Medicare Other

## 2018-05-12 ENCOUNTER — Other Ambulatory Visit: Payer: Self-pay | Admitting: Physician Assistant

## 2018-05-12 DIAGNOSIS — Z8249 Family history of ischemic heart disease and other diseases of the circulatory system: Secondary | ICD-10-CM | POA: Diagnosis not present

## 2018-05-12 DIAGNOSIS — Z800 Family history of malignant neoplasm of digestive organs: Secondary | ICD-10-CM | POA: Diagnosis not present

## 2018-05-12 DIAGNOSIS — Z950 Presence of cardiac pacemaker: Secondary | ICD-10-CM

## 2018-05-12 DIAGNOSIS — Z823 Family history of stroke: Secondary | ICD-10-CM

## 2018-05-12 DIAGNOSIS — Z952 Presence of prosthetic heart valve: Secondary | ICD-10-CM

## 2018-05-12 DIAGNOSIS — E785 Hyperlipidemia, unspecified: Secondary | ICD-10-CM | POA: Diagnosis present

## 2018-05-12 DIAGNOSIS — I442 Atrioventricular block, complete: Secondary | ICD-10-CM | POA: Diagnosis present

## 2018-05-12 DIAGNOSIS — I272 Pulmonary hypertension, unspecified: Secondary | ICD-10-CM | POA: Diagnosis present

## 2018-05-12 DIAGNOSIS — M1990 Unspecified osteoarthritis, unspecified site: Secondary | ICD-10-CM | POA: Diagnosis present

## 2018-05-12 DIAGNOSIS — I4819 Other persistent atrial fibrillation: Secondary | ICD-10-CM | POA: Diagnosis present

## 2018-05-12 DIAGNOSIS — I370 Nonrheumatic pulmonary valve stenosis: Secondary | ICD-10-CM | POA: Diagnosis not present

## 2018-05-12 DIAGNOSIS — Z888 Allergy status to other drugs, medicaments and biological substances status: Secondary | ICD-10-CM

## 2018-05-12 DIAGNOSIS — Z77090 Contact with and (suspected) exposure to asbestos: Secondary | ICD-10-CM | POA: Diagnosis present

## 2018-05-12 DIAGNOSIS — E669 Obesity, unspecified: Secondary | ICD-10-CM | POA: Diagnosis present

## 2018-05-12 DIAGNOSIS — I5032 Chronic diastolic (congestive) heart failure: Secondary | ICD-10-CM | POA: Diagnosis present

## 2018-05-12 DIAGNOSIS — Z7901 Long term (current) use of anticoagulants: Secondary | ICD-10-CM

## 2018-05-12 DIAGNOSIS — I480 Paroxysmal atrial fibrillation: Secondary | ICD-10-CM | POA: Diagnosis present

## 2018-05-12 DIAGNOSIS — I110 Hypertensive heart disease with heart failure: Secondary | ICD-10-CM | POA: Diagnosis present

## 2018-05-12 DIAGNOSIS — J61 Pneumoconiosis due to asbestos and other mineral fibers: Secondary | ICD-10-CM | POA: Diagnosis present

## 2018-05-12 DIAGNOSIS — I350 Nonrheumatic aortic (valve) stenosis: Secondary | ICD-10-CM

## 2018-05-12 DIAGNOSIS — Z96652 Presence of left artificial knee joint: Secondary | ICD-10-CM | POA: Diagnosis present

## 2018-05-12 DIAGNOSIS — Z6836 Body mass index (BMI) 36.0-36.9, adult: Secondary | ICD-10-CM

## 2018-05-12 DIAGNOSIS — I361 Nonrheumatic tricuspid (valve) insufficiency: Secondary | ICD-10-CM | POA: Diagnosis not present

## 2018-05-12 DIAGNOSIS — I10 Essential (primary) hypertension: Secondary | ICD-10-CM | POA: Diagnosis present

## 2018-05-12 DIAGNOSIS — Z006 Encounter for examination for normal comparison and control in clinical research program: Secondary | ICD-10-CM | POA: Diagnosis not present

## 2018-05-12 DIAGNOSIS — Z79899 Other long term (current) drug therapy: Secondary | ICD-10-CM | POA: Diagnosis not present

## 2018-05-12 DIAGNOSIS — I2510 Atherosclerotic heart disease of native coronary artery without angina pectoris: Secondary | ICD-10-CM | POA: Diagnosis present

## 2018-05-12 DIAGNOSIS — Z87891 Personal history of nicotine dependence: Secondary | ICD-10-CM | POA: Diagnosis not present

## 2018-05-12 HISTORY — PX: TRANSCATHETER AORTIC VALVE REPLACEMENT, TRANSFEMORAL: SHX6400

## 2018-05-12 HISTORY — DX: Presence of prosthetic heart valve: Z95.2

## 2018-05-12 HISTORY — PX: TEE WITHOUT CARDIOVERSION: SHX5443

## 2018-05-12 LAB — POCT I-STAT, CHEM 8
BUN: 16 mg/dL (ref 8–23)
BUN: 16 mg/dL (ref 8–23)
BUN: 14 mg/dL (ref 8–23)
CHLORIDE: 107 mmol/L (ref 98–111)
CREATININE: 0.8 mg/dL (ref 0.61–1.24)
Calcium, Ion: 1.22 mmol/L (ref 1.15–1.40)
Calcium, Ion: 1.23 mmol/L (ref 1.15–1.40)
Calcium, Ion: 1.19 mmol/L (ref 1.15–1.40)
Chloride: 105 mmol/L (ref 98–111)
Chloride: 104 mmol/L (ref 98–111)
Creatinine, Ser: 0.8 mg/dL (ref 0.61–1.24)
Creatinine, Ser: 0.7 mg/dL (ref 0.61–1.24)
Glucose, Bld: 95 mg/dL (ref 70–99)
Glucose, Bld: 128 mg/dL — ABNORMAL HIGH (ref 70–99)
Glucose, Bld: 110 mg/dL — ABNORMAL HIGH (ref 70–99)
HCT: 33 % — ABNORMAL LOW (ref 39.0–52.0)
HCT: 31 % — ABNORMAL LOW (ref 39.0–52.0)
HCT: 29 % — ABNORMAL LOW (ref 39.0–52.0)
Hemoglobin: 11.2 g/dL — ABNORMAL LOW (ref 13.0–17.0)
Hemoglobin: 10.5 g/dL — ABNORMAL LOW (ref 13.0–17.0)
Hemoglobin: 9.9 g/dL — ABNORMAL LOW (ref 13.0–17.0)
Potassium: 3.8 mmol/L (ref 3.5–5.1)
Potassium: 4.1 mmol/L (ref 3.5–5.1)
Potassium: 3.8 mmol/L (ref 3.5–5.1)
Sodium: 141 mmol/L (ref 135–145)
Sodium: 141 mmol/L (ref 135–145)
Sodium: 141 mmol/L (ref 135–145)
TCO2: 26 mmol/L (ref 22–32)
TCO2: 27 mmol/L (ref 22–32)
TCO2: 24 mmol/L (ref 22–32)

## 2018-05-12 SURGERY — IMPLANTATION, AORTIC VALVE, TRANSCATHETER, FEMORAL APPROACH
Anesthesia: Monitor Anesthesia Care | Site: Chest

## 2018-05-12 MED ORDER — AMIODARONE HCL 200 MG PO TABS
200.0000 mg | ORAL_TABLET | Freq: Every day | ORAL | Status: DC
  Administered 2018-05-12 – 2018-05-13 (×2): 200 mg via ORAL
  Filled 2018-05-12 (×2): qty 1

## 2018-05-12 MED ORDER — CHLORHEXIDINE GLUCONATE 4 % EX LIQD: 60.0000 mL | Freq: Once | CUTANEOUS | Status: DC

## 2018-05-12 MED ORDER — OXYCODONE HCL 5 MG PO TABS: 5.0000 mg | ORAL_TABLET | ORAL | Status: DC | PRN

## 2018-05-12 MED ORDER — RIVAROXABAN 20 MG PO TABS: 20.0000 mg | ORAL_TABLET | Freq: Every day | ORAL | Status: DC

## 2018-05-12 MED ORDER — CHLORHEXIDINE GLUCONATE 4 % EX LIQD: 30.0000 mL | CUTANEOUS | Status: DC

## 2018-05-12 MED ORDER — MORPHINE SULFATE (PF) 2 MG/ML IV SOLN: 1.0000 mg | INTRAVENOUS | Status: DC | PRN

## 2018-05-12 MED ORDER — CHLORHEXIDINE GLUCONATE 0.12 % MT SOLN
15.0000 mL | Freq: Once | OROMUCOSAL | Status: AC
  Administered 2018-05-12: 15 mL via OROMUCOSAL
  Filled 2018-05-12: qty 15

## 2018-05-12 MED ORDER — FENTANYL CITRATE (PF) 100 MCG/2ML IJ SOLN
INTRAMUSCULAR | Status: AC
  Administered 2018-05-12: 50 ug via INTRAVENOUS
  Filled 2018-05-12: qty 2

## 2018-05-12 MED ORDER — LIDOCAINE HCL (PF) 1 % IJ SOLN
INTRAMUSCULAR | Status: DC | PRN
  Administered 2018-05-12: 13 mL via INTRADERMAL

## 2018-05-12 MED ORDER — VANCOMYCIN HCL IN DEXTROSE 1-5 GM/200ML-% IV SOLN
1000.0000 mg | Freq: Once | INTRAVENOUS | Status: AC
  Administered 2018-05-13: 1000 mg via INTRAVENOUS
  Filled 2018-05-12: qty 200

## 2018-05-12 MED ORDER — SODIUM CHLORIDE 0.9 % IV SOLN
1.5000 g | Freq: Two times a day (BID) | INTRAVENOUS | Status: DC
  Administered 2018-05-12: 1.5 g via INTRAVENOUS
  Filled 2018-05-12 (×2): qty 1.5

## 2018-05-12 MED ORDER — ONDANSETRON HCL 4 MG/2ML IJ SOLN: 4.0000 mg | Freq: Four times a day (QID) | INTRAMUSCULAR | Status: DC | PRN

## 2018-05-12 MED ORDER — PHENYLEPHRINE HCL 10 MG/ML IJ SOLN
INTRAMUSCULAR | Status: DC | PRN
  Administered 2018-05-12: 80 ug via INTRAVENOUS
  Administered 2018-05-12: 40 ug via INTRAVENOUS

## 2018-05-12 MED ORDER — 0.9 % SODIUM CHLORIDE (POUR BTL) OPTIME
TOPICAL | Status: DC | PRN
  Administered 2018-05-12: 1000 mL

## 2018-05-12 MED ORDER — FENTANYL CITRATE (PF) 100 MCG/2ML IJ SOLN
INTRAMUSCULAR | Status: DC | PRN
  Administered 2018-05-12: 25 ug via INTRAVENOUS

## 2018-05-12 MED ORDER — LACTATED RINGERS IV SOLN
INTRAVENOUS | Status: DC
  Administered 2018-05-12: 10:00:00 via INTRAVENOUS

## 2018-05-12 MED ORDER — FENTANYL CITRATE (PF) 250 MCG/5ML IJ SOLN
INTRAMUSCULAR | Status: AC
  Filled 2018-05-12: qty 5

## 2018-05-12 MED ORDER — TRAMADOL HCL 50 MG PO TABS: 50.0000 mg | ORAL_TABLET | ORAL | Status: DC | PRN

## 2018-05-12 MED ORDER — NITROGLYCERIN IN D5W 200-5 MCG/ML-% IV SOLN: 0.0000 ug/min | INTRAVENOUS | Status: DC

## 2018-05-12 MED ORDER — SODIUM CHLORIDE 0.9 % IV SOLN: 250.0000 mL | INTRAVENOUS | Status: DC | PRN

## 2018-05-12 MED ORDER — HEPARIN SODIUM (PORCINE) 1000 UNIT/ML IJ SOLN
INTRAMUSCULAR | Status: AC
  Filled 2018-05-12: qty 3

## 2018-05-12 MED ORDER — MIDAZOLAM HCL 2 MG/2ML IJ SOLN
INTRAMUSCULAR | Status: AC
  Administered 2018-05-12: 1 mg via INTRAVENOUS
  Filled 2018-05-12: qty 2

## 2018-05-12 MED ORDER — ASPIRIN 81 MG PO CHEW
81.0000 mg | CHEWABLE_TABLET | Freq: Every day | ORAL | Status: DC
  Administered 2018-05-13: 81 mg via ORAL
  Filled 2018-05-12: qty 1

## 2018-05-12 MED ORDER — IODIXANOL 320 MG/ML IV SOLN
INTRAVENOUS | Status: DC | PRN
  Administered 2018-05-12: 39.6 mL via INTRAVENOUS

## 2018-05-12 MED ORDER — ACETAMINOPHEN 500 MG PO TABS
1000.0000 mg | ORAL_TABLET | Freq: Four times a day (QID) | ORAL | Status: DC | PRN
  Administered 2018-05-13: 1000 mg via ORAL
  Filled 2018-05-12: qty 2

## 2018-05-12 MED ORDER — ONDANSETRON HCL 4 MG/2ML IJ SOLN
INTRAMUSCULAR | Status: DC | PRN
  Administered 2018-05-12: 4 mg via INTRAVENOUS

## 2018-05-12 MED ORDER — PROPOFOL 10 MG/ML IV BOLUS
INTRAVENOUS | Status: DC | PRN
  Administered 2018-05-12: 20 mg via INTRAVENOUS

## 2018-05-12 MED ORDER — PROPOFOL 500 MG/50ML IV EMUL
INTRAVENOUS | Status: DC | PRN
  Administered 2018-05-12: 10 ug/kg/min via INTRAVENOUS

## 2018-05-12 MED ORDER — PHENYLEPHRINE HCL-NACL 20-0.9 MG/250ML-% IV SOLN
0.0000 ug/min | INTRAVENOUS | Status: DC
  Filled 2018-05-12: qty 250

## 2018-05-12 MED ORDER — PROPOFOL 10 MG/ML IV BOLUS
INTRAVENOUS | Status: AC
  Filled 2018-05-12: qty 20

## 2018-05-12 MED ORDER — SODIUM CHLORIDE 0.9% FLUSH: 3.0000 mL | INTRAVENOUS | Status: DC | PRN

## 2018-05-12 MED ORDER — NITROGLYCERIN 0.2 MG/ML ON CALL CATH LAB
INTRAVENOUS | Status: DC | PRN
  Administered 2018-05-12: 20 ug via INTRAVENOUS

## 2018-05-12 MED ORDER — FENTANYL CITRATE (PF) 100 MCG/2ML IJ SOLN
50.0000 ug | Freq: Once | INTRAMUSCULAR | Status: AC
  Administered 2018-05-12: 50 ug via INTRAVENOUS

## 2018-05-12 MED ORDER — PHENYLEPHRINE 40 MCG/ML (10ML) SYRINGE FOR IV PUSH (FOR BLOOD PRESSURE SUPPORT)
PREFILLED_SYRINGE | INTRAVENOUS | Status: AC
  Filled 2018-05-12: qty 10

## 2018-05-12 MED ORDER — METOPROLOL TARTRATE 25 MG PO TABS
25.0000 mg | ORAL_TABLET | Freq: Two times a day (BID) | ORAL | Status: DC
  Administered 2018-05-12 – 2018-05-13 (×2): 25 mg via ORAL
  Filled 2018-05-12 (×2): qty 1

## 2018-05-12 MED ORDER — SODIUM CHLORIDE 0.9 % IV SOLN
INTRAVENOUS | Status: AC
  Administered 2018-05-12: 50 mL/h via INTRAVENOUS

## 2018-05-12 MED ORDER — SODIUM CHLORIDE 0.9 % IV SOLN
INTRAVENOUS | Status: DC | PRN
  Administered 2018-05-12 (×3): 500 mL

## 2018-05-12 MED ORDER — MIDAZOLAM HCL 2 MG/2ML IJ SOLN
INTRAMUSCULAR | Status: AC
  Filled 2018-05-12: qty 2

## 2018-05-12 MED ORDER — SODIUM CHLORIDE 0.9 % IV SOLN
INTRAVENOUS | Status: DC
  Administered 2018-05-12: 12:00:00 via INTRAVENOUS

## 2018-05-12 MED ORDER — PROTAMINE SULFATE 10 MG/ML IV SOLN
INTRAVENOUS | Status: DC | PRN
  Administered 2018-05-12: 140 mg via INTRAVENOUS

## 2018-05-12 MED ORDER — MORPHINE SULFATE (PF) 10 MG/ML IV SOLN: 1.0000 mg | INTRAVENOUS | Status: DC | PRN

## 2018-05-12 MED ORDER — HEPARIN SODIUM (PORCINE) 1000 UNIT/ML IJ SOLN
INTRAMUSCULAR | Status: DC | PRN
  Administered 2018-05-12: 14000 [IU] via INTRAVENOUS

## 2018-05-12 MED ORDER — SODIUM CHLORIDE 0.9% FLUSH: 3.0000 mL | Freq: Two times a day (BID) | INTRAVENOUS | Status: DC

## 2018-05-12 MED ORDER — SODIUM CHLORIDE 0.9 % IV SOLN
INTRAVENOUS | Status: AC
  Filled 2018-05-12 (×3): qty 1.2

## 2018-05-12 MED ORDER — LIDOCAINE HCL 1 % IJ SOLN
INTRAMUSCULAR | Status: AC
  Filled 2018-05-12: qty 20

## 2018-05-12 MED ORDER — METOPROLOL TARTRATE 5 MG/5ML IV SOLN: 2.5000 mg | INTRAVENOUS | Status: DC | PRN

## 2018-05-12 MED ORDER — MIDAZOLAM HCL 2 MG/2ML IJ SOLN
1.0000 mg | Freq: Once | INTRAMUSCULAR | Status: AC
  Administered 2018-05-12: 1 mg via INTRAVENOUS

## 2018-05-12 MED ORDER — ONDANSETRON HCL 4 MG/2ML IJ SOLN
INTRAMUSCULAR | Status: AC
  Filled 2018-05-12: qty 2

## 2018-05-12 SURGICAL SUPPLY — 91 items
ADH SKN CLS APL DERMABOND .7 (GAUZE/BANDAGES/DRESSINGS) ×2
BAG DECANTER FOR FLEXI CONT (MISCELLANEOUS) IMPLANT
BAG SNAP BAND KOVER 36X36 (MISCELLANEOUS) ×3 IMPLANT
BLADE CLIPPER SURG (BLADE) IMPLANT
BLADE STERNUM SYSTEM 6 (BLADE) IMPLANT
CABLE ADAPT CONN TEMP 6FT (ADAPTER) ×3 IMPLANT
CANISTER SUCT 3000ML PPV (MISCELLANEOUS) IMPLANT
CANNULA FEM VENOUS REMOTE 22FR (CANNULA) IMPLANT
CANNULA OPTISITE PERFUSION 16F (CANNULA) IMPLANT
CANNULA OPTISITE PERFUSION 18F (CANNULA) IMPLANT
CATH DIAG EXPO 6F VENT PIG 145 (CATHETERS) ×6 IMPLANT
CATH EXPO 5FR AL1 (CATHETERS) IMPLANT
CATH INFINITI 6F AL2 (CATHETERS) IMPLANT
CATH S G BIP PACING (CATHETERS) ×3 IMPLANT
CLIP VESOCCLUDE MED 24/CT (CLIP) ×3 IMPLANT
CLIP VESOCCLUDE SM WIDE 24/CT (CLIP) ×3 IMPLANT
CONT SPEC 4OZ CLIKSEAL STRL BL (MISCELLANEOUS) ×6 IMPLANT
COVER BACK TABLE 80X110 HD (DRAPES) IMPLANT
COVER DOME SNAP 22 D (MISCELLANEOUS) IMPLANT
COVER WAND RF STERILE (DRAPES) ×3 IMPLANT
CRADLE DONUT ADULT HEAD (MISCELLANEOUS) ×3 IMPLANT
DERMABOND ADVANCED (GAUZE/BANDAGES/DRESSINGS) ×1
DERMABOND ADVANCED .7 DNX12 (GAUZE/BANDAGES/DRESSINGS) ×2 IMPLANT
DEVICE CLOSURE PERCLS PRGLD 6F (VASCULAR PRODUCTS) ×4 IMPLANT
DRAPE INCISE IOBAN 66X45 STRL (DRAPES) IMPLANT
DRSG TEGADERM 4X4.75 (GAUZE/BANDAGES/DRESSINGS) ×5 IMPLANT
ELECT CAUTERY BLADE 6.4 (BLADE) IMPLANT
ELECT REM PT RETURN 9FT ADLT (ELECTROSURGICAL) ×6
ELECTRODE REM PT RTRN 9FT ADLT (ELECTROSURGICAL) ×4 IMPLANT
FELT TEFLON 6X6 (MISCELLANEOUS) ×3 IMPLANT
FEMORAL VENOUS CANN RAP (CANNULA) IMPLANT
GAUZE SPONGE 2X2 8PLY STRL LF (GAUZE/BANDAGES/DRESSINGS) IMPLANT
GAUZE SPONGE 4X4 12PLY STRL (GAUZE/BANDAGES/DRESSINGS) ×3 IMPLANT
GLOVE BIO SURGEON STRL SZ7.5 (GLOVE) IMPLANT
GLOVE BIO SURGEON STRL SZ8 (GLOVE) IMPLANT
GLOVE EUDERMIC 7 POWDERFREE (GLOVE) IMPLANT
GLOVE ORTHO TXT STRL SZ7.5 (GLOVE) IMPLANT
GOWN STRL REUS W/ TWL LRG LVL3 (GOWN DISPOSABLE) IMPLANT
GOWN STRL REUS W/ TWL XL LVL3 (GOWN DISPOSABLE) ×2 IMPLANT
GOWN STRL REUS W/TWL LRG LVL3 (GOWN DISPOSABLE)
GOWN STRL REUS W/TWL XL LVL3 (GOWN DISPOSABLE) ×3
GUIDEWIRE SAF TJ AMPL .035X180 (WIRE) ×3 IMPLANT
GUIDEWIRE SAFE TJ AMPLATZ EXST (WIRE) ×3 IMPLANT
GUIDEWIRE STRAIGHT .035 260CM (WIRE) ×3 IMPLANT
INSERT FOGARTY SM (MISCELLANEOUS) IMPLANT
KIT BASIN OR (CUSTOM PROCEDURE TRAY) ×3 IMPLANT
KIT DILATOR VASC 18G NDL (KITS) IMPLANT
KIT HEART LEFT (KITS) ×3 IMPLANT
KIT SUCTION CATH 14FR (SUCTIONS) ×6 IMPLANT
KIT TURNOVER KIT B (KITS) ×3 IMPLANT
LOOP VESSEL MAXI BLUE (MISCELLANEOUS) IMPLANT
LOOP VESSEL MINI RED (MISCELLANEOUS) IMPLANT
NDL PERC 18GX7CM (NEEDLE) ×2 IMPLANT
NEEDLE PERC 18GX7CM (NEEDLE) ×3 IMPLANT
NS IRRIG 1000ML POUR BTL (IV SOLUTION) ×3 IMPLANT
PACK ENDOVASCULAR (PACKS) ×3 IMPLANT
PAD ARMBOARD 7.5X6 YLW CONV (MISCELLANEOUS) ×6 IMPLANT
PAD ELECT DEFIB RADIOL ZOLL (MISCELLANEOUS) ×3 IMPLANT
PENCIL BUTTON HOLSTER BLD 10FT (ELECTRODE) IMPLANT
PERCLOSE PROGLIDE 6F (VASCULAR PRODUCTS) ×6
SET MICROPUNCTURE 5F STIFF (MISCELLANEOUS) ×3 IMPLANT
SHEATH BRITE TIP 6FR 35CM (SHEATH) ×3 IMPLANT
SHEATH PINNACLE 6F 10CM (SHEATH) IMPLANT
SHEATH PINNACLE 8F 10CM (SHEATH) ×3 IMPLANT
SLEEVE REPOSITIONING LENGTH 30 (MISCELLANEOUS) ×3 IMPLANT
SPONGE GAUZE 2X2 STER 10/PKG (GAUZE/BANDAGES/DRESSINGS) ×1
SPONGE LAP 4X18 RFD (DISPOSABLE) ×3 IMPLANT
STOPCOCK MORSE 400PSI 3WAY (MISCELLANEOUS) ×6 IMPLANT
SUT ETHIBOND X763 2 0 SH 1 (SUTURE) IMPLANT
SUT GORETEX CV 4 TH 22 36 (SUTURE) IMPLANT
SUT GORETEX CV4 TH-18 (SUTURE) IMPLANT
SUT MNCRL AB 3-0 PS2 18 (SUTURE) IMPLANT
SUT PROLENE 5 0 C 1 36 (SUTURE) IMPLANT
SUT PROLENE 6 0 C 1 30 (SUTURE) IMPLANT
SUT SILK  1 MH (SUTURE) ×1
SUT SILK 1 MH (SUTURE) ×2 IMPLANT
SUT VIC AB 2-0 CT1 27 (SUTURE)
SUT VIC AB 2-0 CT1 TAPERPNT 27 (SUTURE) IMPLANT
SUT VIC AB 2-0 CTX 36 (SUTURE) IMPLANT
SUT VIC AB 3-0 SH 8-18 (SUTURE) IMPLANT
SYR 50ML LL SCALE MARK (SYRINGE) ×3 IMPLANT
SYR BULB IRRIGATION 50ML (SYRINGE) IMPLANT
SYR CONTROL 10ML LL (SYRINGE) IMPLANT
TOWEL GREEN STERILE (TOWEL DISPOSABLE) ×6 IMPLANT
TRANSDUCER W/STOPCOCK (MISCELLANEOUS) ×6 IMPLANT
TRAY FOLEY SLVR 14FR TEMP STAT (SET/KITS/TRAYS/PACK) IMPLANT
TUBE SUCT INTRACARD DLP 20F (MISCELLANEOUS) IMPLANT
URINAL MALE W/LID DISP 1000CC (MISCELLANEOUS) IMPLANT
VALVE HEART TRANSCATH SZ3 26MM (Valve) ×1 IMPLANT
WIRE .035 3MM-J 145CM (WIRE) ×3 IMPLANT
WIRE BENTSON .035X145CM (WIRE) ×3 IMPLANT

## 2018-05-12 NOTE — Progress Notes (Signed)
Pt arrived to 4e from cath lab. Pt oriented to room and staff. Vitals obtained. Telemetry applied and CCMD notified x2. CHG bath completed. Bilateral groin sites level 0. Palpable pedal pulses on both feet. Pt denies pain at this time. Will continue to monitor.   Ara Kussmaul BSN, RN

## 2018-05-12 NOTE — Anesthesia Preprocedure Evaluation (Addendum)
Anesthesia Evaluation  Patient identified by MRN, date of birth, ID band Patient awake    Reviewed: Allergy & Precautions, NPO status , Patient's Chart, lab work & pertinent test results  Airway Mallampati: II  TM Distance: >3 FB Neck ROM: Full    Dental  (+) Dental Advisory Given   Pulmonary former smoker,  Asbestosis and chronic lung disease.   breath sounds clear to auscultation       Cardiovascular hypertension, Pt. on medications and Pt. on home beta blockers +CHF  + dysrhythmias (CHB s/p pacemaker) + pacemaker + Valvular Problems/Murmurs AS  Rhythm:Regular Rate:Normal     Neuro/Psych negative neurological ROS     GI/Hepatic negative GI ROS, Neg liver ROS,   Endo/Other  negative endocrine ROS  Renal/GU negative Renal ROS     Musculoskeletal  (+) Arthritis ,   Abdominal   Peds  Hematology negative hematology ROS (+)   Anesthesia Other Findings   Reproductive/Obstetrics                            Lab Results  Component Value Date   WBC 4.4 05/10/2018   HGB 12.8 (L) 05/10/2018   HCT 39.3 05/10/2018   MCV 94.7 05/10/2018   PLT 138 (L) 05/10/2018   Lab Results  Component Value Date   CREATININE 0.99 05/10/2018   BUN 18 05/10/2018   NA 136 05/10/2018   K 3.9 05/10/2018   CL 103 05/10/2018   CO2 23 05/10/2018    Anesthesia Physical Anesthesia Plan  ASA: IV  Anesthesia Plan: MAC   Post-op Pain Management:    Induction: Intravenous  PONV Risk Score and Plan: 1 and Ondansetron, Treatment may vary due to age or medical condition and Dexamethasone  Airway Management Planned: Natural Airway and Simple Face Mask  Additional Equipment: Arterial line, CVP and Ultrasound Guidance Line Placement  Intra-op Plan:   Post-operative Plan:   Informed Consent: I have reviewed the patients History and Physical, chart, labs and discussed the procedure including the risks, benefits  and alternatives for the proposed anesthesia with the patient or authorized representative who has indicated his/her understanding and acceptance.     Plan Discussed with: CRNA  Anesthesia Plan Comments:         Anesthesia Quick Evaluation

## 2018-05-12 NOTE — Progress Notes (Signed)
Pt ambulated 150 ft in hallway with standby assist. HR and rhythm stable. Pt tolerated well. Returned to bed. Groin sites stable. Pt denies needs.   Ara Kussmaul BSN, RN

## 2018-05-12 NOTE — Anesthesia Postprocedure Evaluation (Signed)
Anesthesia Post Note  Patient: [name]  Procedure(s) Performed: TRANSCATHETER AORTIC VALVE REPLACEMENT, TRANSFEMORAL (N/A Chest) TRANSESOPHAGEAL ECHOCARDIOGRAM (TEE) (N/A )     Patient location during evaluation: PACU Anesthesia Type: MAC Level of consciousness: awake and alert Pain management: pain level controlled Vital Signs Assessment: post-procedure vital signs reviewed and stable Respiratory status: spontaneous breathing, nonlabored ventilation, respiratory function stable and patient connected to nasal cannula oxygen Cardiovascular status: stable and blood pressure returned to baseline Postop Assessment: no apparent nausea or vomiting Anesthetic complications: no    Last Vitals:  Vitals:   05/12/18 1500 05/12/18 1504  BP: (!) 103/51 (!) 106/44  Pulse: (!) 59 60  Resp: 13 16  Temp:  36.6 C  SpO2: 94% 93%    Last Pain:  Vitals:   05/12/18 1014  PainSc: 0-No pain                 Tiajuana Amass

## 2018-05-12 NOTE — Anesthesia Procedure Notes (Signed)
Arterial Line Insertion Start/End12/20/2019 12:00 PM, 05/12/2018 12:07 PM Performed by: Josephine Igo, CRNA, CRNA  Preanesthetic checklist: patient identified, IV checked, site marked, risks and benefits discussed, surgical consent, monitors and equipment checked, pre-op evaluation, timeout performed and anesthesia consent Lidocaine 1% used for infiltration and patient sedated Right, radial was placed Catheter size: 20 G Hand hygiene performed  and maximum sterile barriers used  Allen's test indicative of satisfactory collateral circulation Attempts: 1 Procedure performed without using ultrasound guided technique. Ultrasound Notes:anatomy identified Following insertion, dressing applied and Biopatch. Post procedure assessment: normal  Patient tolerated the procedure well with no immediate complications.

## 2018-05-12 NOTE — Discharge Instructions (Signed)

## 2018-05-12 NOTE — Op Note (Signed)
[facility]   MULTIDISCIPLINARY HEART VALVE TEAM   TAVR OPERATIVE NOTE   Date of Procedure:  05/12/2018  Preoperative Diagnosis: Severe Aortic Stenosis   Postoperative Diagnosis: Same   Procedure:    Transcatheter Aortic Valve Replacement - Percutaneous Right Transfemoral Approach  Edwards Sapien 3 THV (size 26 mm, model # 9600TFX, serial # [id number])   Co-Surgeons:  Valentina Gu. 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  Patient states that he has known of a presence of a heart murmur for many years, having previously been told the presence of a heart murmur when he joined the [employer] as a young man.  He has been followed for the last several years by Dr. Agustin Cree.  Echocardiograms have documented the presence of aortic stenosis that has gradually progressed in severity.  He has a long-standing history of atrial fibrillation for which he has been anticoagulated using Xarelto.  In 2013 he underwent implantation of permanent pacemaker for complete heart block.  The patient states that over the past 2 to 3 years he has developed progressive symptoms of exertional shortness of breath and  fatigue.  Recent follow-up echocardiogram revealed significant progression and severity of aortic stenosis with peak velocity across aortic valve measured 4.3 m/s corresponding to mean transvalvular gradient estimated 48 mmHg.  Left ventricular systolic function remains preserved with ejection fraction estimated 55 to 60%.  There was significant calcification of the mitral annulus but only mild mitral regurgitation and no significant mitral stenosis.  Left and right heart catheterization performed March 28, 2018 by Dr. Tamala Julian confirmed the presence of aortic stenosis and revealed moderate coronary artery disease.  Peak to peak and mean transvalvular gradients across the aortic valve were measured 45 and 30 mmHg, respectively, corresponding to aortic valve area calculated 1.3 cm.  There was mild pulmonary hypertension.  The patient was referred to the multidisciplinary heart valve clinic and has been evaluated previously by Dr. Burt Knack.  CT angiography was performed and the patient referred for surgical consultation.  During the course of the patient's preoperative work up they have been evaluated comprehensively by a multidisciplinary team of specialists coordinated through the [facility] in the [facility].  They have been demonstrated to suffer from symptomatic severe aortic stenosis as noted above. The patient has been counseled extensively as to the relative risks and benefits of all options for the treatment of severe aortic stenosis including long term medical therapy, conventional surgery for aortic valve replacement, and transcatheter aortic valve replacement.  All questions have been answered, and the patient provides full informed consent for the operation as described.   DETAILS OF THE OPERATIVE PROCEDURE  PREPARATION:    The patient is brought to the operating room on the above mentioned date and central monitoring was established by the  anesthesia team including  placement of a central venous line and radial arterial line. The patient is placed in the supine position on the operating table.  Intravenous antibiotics are administered. The patient is monitored closely throughout the procedure under conscious sedation.  Baseline transthoracic echocardiogram was performed. The patient's chest, abdomen, both groins, and both lower extremities are prepared and draped in a sterile manner. A time out procedure is performed.   PERIPHERAL ACCESS:    Using the modified Seldinger technique, femoral arterial and venous access was obtained with placement of 6 Fr sheaths on the left side.  A pigtail diagnostic catheter was passed through the left arterial sheath under fluoroscopic guidance into the aortic root.  A temporary transvenous pacemaker catheter was passed through the left femoral venous sheath under fluoroscopic guidance into the right ventricle.  The pacemaker was tested to ensure stable lead placement and pacemaker capture. Aortic root angiography was performed in order to determine the optimal angiographic angle for valve deployment.   TRANSFEMORAL ACCESS:   Percutaneous transfemoral access and sheath placement was performed by Dr. Burt Knack using ultrasound guidance.  The right common femoral artery was cannulated using a micropuncture needle and appropriate location was verified using hand injection angiogram.  A pair of Abbott Perclose percutaneous closure devices were placed and a 6 French sheath replaced into the femoral artery.  The patient was heparinized systemically and ACT verified > 250 seconds.    A 14 Fr transfemoral E-sheath was introduced into the right common femoral artery after progressively dilating over an Amplatz superstiff wire. An AL-1 catheter was used to direct a straight-tip exchange length wire across the native aortic valve into the left ventricle. This was exchanged out for a pigtail catheter and position was  confirmed in the LV apex. Simultaneous LV and Ao pressures were recorded.  The pigtail catheter was exchanged for an Amplatz Extra-stiff wire in the LV apex.  Echocardiography was utilized to confirm appropriate wire position and no sign of entanglement in the mitral subvalvular apparatus.   TRANSCATHETER HEART VALVE DEPLOYMENT:   An Edwards Sapien 3 transcatheter heart valve (size 26 mm, model #9600TFX, serial #[id number]) was prepared and crimped per manufacturer's guidelines, and the proper orientation of the valve is confirmed on the [employer] delivery system. The valve was advanced through the introducer sheath using normal technique until in an appropriate position in the abdominal aorta beyond the sheath tip. The balloon was then retracted and using the fine-tuning wheel was centered on the valve. The valve was then advanced across the aortic arch using appropriate flexion of the catheter. The valve was carefully positioned across the aortic valve annulus. The Commander catheter was retracted using normal technique. Once final position of the valve has been confirmed by angiographic assessment, the valve is deployed while temporarily holding ventilation and during rapid ventricular pacing to maintain systolic blood pressure < 50 mmHg and pulse pressure < 10 mmHg. The balloon inflation is held for >3 seconds after reaching full deployment volume. Once the balloon has fully deflated the balloon is retracted into the ascending aorta and valve function is assessed using echocardiography. There is felt to be no paravalvular leak and no central aortic insufficiency.  The patient's hemodynamic recovery following valve deployment is good.  The deployment balloon and guidewire are both removed.    PROCEDURE COMPLETION:   The sheath was removed and femoral artery closure performed by Dr Burt Knack.  Protamine was administered once femoral arterial repair was complete. The temporary pacemaker, pigtail  catheters and  femoral sheaths were removed with manual pressure used for hemostasis.   The patient tolerated the procedure well and is transported to the surgical intensive care in stable condition. There were no immediate intraoperative complications. All sponge instrument and needle counts are verified correct at completion of the operation.   No blood products were administered during the operation.  The patient received a total of 39.6 mL of intravenous contrast during the procedure.   Rexene Alberts, MD 05/12/2018 2:04 PM

## 2018-05-12 NOTE — Transfer of Care (Signed)
Immediate Anesthesia Transfer of Care Note  Patient: Frederick Vasquez  Procedure(s) Performed: TRANSCATHETER AORTIC VALVE REPLACEMENT, TRANSFEMORAL (N/A Chest) TRANSESOPHAGEAL ECHOCARDIOGRAM (TEE) (N/A )  Patient Location: PACU and Cath Lab  Anesthesia Type:MAC  Level of Consciousness: awake and alert   Airway & Oxygen Therapy: Patient Spontanous Breathing and Patient connected to nasal cannula oxygen  Post-op Assessment: Report given to RN and Post -op Vital signs reviewed and stable  Post vital signs: Reviewed and stable  Last Vitals:  Vitals Value Taken Time  BP 106/46 05/12/2018  2:33 PM  Temp    Pulse 59 05/12/2018  2:36 PM  Resp 14 05/12/2018  2:36 PM  SpO2 96 % 05/12/2018  2:36 PM  Vitals shown include unvalidated device data.  Last Pain:  Vitals:   05/12/18 1014  PainSc: 0-No pain      Patients Stated Pain Goal: 3 (03/50/09 3818)  Complications: No apparent anesthesia complications

## 2018-05-12 NOTE — Anesthesia Procedure Notes (Signed)
Central Venous Catheter Insertion Performed by: Oleta Mouse, MD, anesthesiologist Start/End12/20/2019 11:56 AM, 05/12/2018 12:01 PM Patient location: Pre-op. Preanesthetic checklist: patient identified, IV checked, site marked, risks and benefits discussed, surgical consent, monitors and equipment checked, pre-op evaluation, timeout performed and anesthesia consent Position: supine Lidocaine 1% used for infiltration and patient sedated Hand hygiene performed  and maximum sterile barriers used  Catheter size: 8 Fr Total catheter length 16. Central line was placed.Double lumen Procedure performed using ultrasound guided technique. Ultrasound Notes:image(s) printed for medical record Attempts: 1 Following insertion, dressing applied, line sutured and Biopatch. Post procedure assessment: blood return through all ports, free fluid flow and no air  Patient tolerated the procedure well with no immediate complications.

## 2018-05-12 NOTE — Op Note (Signed)
[facility]   MULTIDISCIPLINARY HEART VALVE TEAM   TAVR OPERATIVE NOTE   Date of Procedure:  05/12/2018  Preoperative Diagnosis: Severe Aortic Stenosis   Postoperative Diagnosis: Same   Procedure:    Transcatheter Aortic Valve Replacement - Percutaneous  Transfemoral Approach  Edwards Sapien 3 THV (size 26 mm, model # 9600TFX, serial #[id number])   Co-Surgeons:  Valentina Gu. 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  During the course of the patient's preoperative work up they have been evaluated comprehensively by a multidisciplinary team of specialists coordinated through the [facility] in the [facility].  They have been demonstrated to suffer from symptomatic severe aortic stenosis as noted above. The patient has been counseled extensively as to the relative risks and benefits of all options for the treatment of severe aortic stenosis including long term medical therapy, conventional surgery for aortic valve replacement, and transcatheter aortic valve replacement. Based upon review of all of the patient's preoperative diagnostic tests they are felt to be candidate for transcatheter aortic valve replacement using the transfemoral approach as an alternative to high risk conventional surgery.    Following  the decision to proceed with transcatheter aortic valve replacement, a discussion has been held regarding what types of management strategies would be attempted intraoperatively in the event of life-threatening complications, including whether or not the patient would be considered a candidate for the use of cardiopulmonary bypass and/or conversion to open sternotomy for attempted surgical intervention.  The patient has been advised of a variety of complications that might develop peculiar to this approach including but not limited to risks of death, stroke, paravalvular leak, aortic dissection or other major vascular complications, aortic annulus rupture, device embolization, cardiac rupture or perforation, acute myocardial infarction, arrhythmia, heart block or bradycardia requiring permanent pacemaker placement, congestive heart failure, respiratory failure, renal failure, pneumonia, infection, other late complications related to structural valve deterioration or migration, or other complications that might ultimately cause a temporary or permanent loss of functional independence or other long term morbidity.  The patient provides full informed consent for the procedure as described and all questions were answered preoperatively.  DETAILS OF THE OPERATIVE PROCEDURE  PREPARATION:   The patient is brought to the operating room on the above mentioned date and central monitoring was established by the anesthesia team including placement of a central venous catheter and radial arterial line. The patient is placed in the supine position on the operating table.  Intravenous antibiotics are administered. The patient is monitored closely throughout the procedure under conscious sedation.  Baseline transthoracic echocardiogram is performed. The patient's chest, abdomen, both groins, and both lower extremities are prepared and draped in a sterile manner. A time out procedure is performed.   PERIPHERAL ACCESS:     Using ultrasound guidance, femoral arterial and venous access is obtained with placement of 6 Fr sheaths on the left side.  A pigtail diagnostic catheter was passed through the femoral arterial sheath under fluoroscopic guidance  into the aortic root.  A temporary transvenous pacemaker catheter was passed through the femoral venous sheath under fluoroscopic guidance into the right ventricle.  The pacemaker was tested to ensure stable lead placement and pacemaker capture. Aortic root angiography was performed in order to determine the optimal angiographic angle for valve deployment.  TRANSFEMORAL ACCESS:  A micropuncture technique is used to access the right femoral artery under fluoroscopic and ultrasound guidance.  2 Perclose devices are deployed at 10' and 2' positions to 'PreClose' the femoral artery. An 8 French sheath is placed and then an Amplatz Superstiff wire is advanced through the sheath. This is changed out for a 14 French transfemoral E-Sheath after progressively dilating over the Superstiff wire.  An AL-2 catheter was used to direct a straight-tip exchange length wire across the native aortic valve into the left ventricle. This was exchanged out for a pigtail catheter and position was confirmed in the LV apex. Simultaneous LV and Ao pressures were recorded.  The pigtail catheter was exchanged for an Amplatz Extra-stiff wire in the LV apex.    BALLOON AORTIC VALVULOPLASTY:  Not performed  TRANSCATHETER HEART VALVE DEPLOYMENT:  An Edwards Sapien 3 transcatheter heart valve (size 26 mm, model #9600TFX, serial #[id number]) was prepared and crimped per manufacturer's guidelines, and the proper orientation of the valve is confirmed on the [employer] delivery system. The valve was advanced through the introducer sheath using normal technique until in an appropriate position in the abdominal aorta beyond the sheath tip. The balloon was then retracted and using the fine-tuning wheel was centered  on the valve. The valve was then advanced across the aortic arch using appropriate flexion of the catheter. The valve was carefully positioned across the aortic valve annulus. The Commander catheter was retracted using normal technique. Once final position of the valve has been confirmed by angiographic assessment, the valve is deployed while temporarily holding ventilation and during rapid ventricular pacing to maintain systolic blood pressure < 50 mmHg and pulse pressure < 10 mmHg. The balloon inflation is held for >3 seconds after reaching full deployment volume. Once the balloon has fully deflated the balloon is retracted into the ascending aorta and valve function is assessed using echocardiography. There is felt to be no paravalvular leak and no central aortic insufficiency.  The patient's hemodynamic recovery following valve deployment is good.  The deployment balloon and guidewire are both removed. Echo demostrated acceptable post-procedural gradients, stable mitral valve function, and no aortic insufficiency.   PROCEDURE COMPLETION:  The sheath was removed and femoral artery closure is performed using the 2 previously deployed Perclose devices.  Protamine is administered once femoral arterial repair was complete. The site is clear with no evidence of bleeding or hematoma after the sutures are tightened. The temporary pacemaker, pigtail catheters and femoral sheaths were removed with manual pressure used for hemostasis.   The patient tolerated the procedure well and is transported to the surgical intensive care in stable condition. There were no immediate intraoperative complications. All sponge instrument and needle counts are verified correct at completion of the operation.   The patient received a total of 40 mL of intravenous contrast during the procedure.   Sherren Mocha, MD 05/12/2018 2:22 PM

## 2018-05-12 NOTE — Progress Notes (Signed)
[employer] rep aware of pt's procedure. Tomi Bamberger stated that if they are needed, please contact for assistance.   Jacqlyn Larsen, RN

## 2018-05-12 NOTE — Interval H&P Note (Signed)
History and Physical Interval Note:  05/12/2018 10:15 AM  Frederick Vasquez  has presented today for surgery, with the diagnosis of Severe Aortic Stenosis  The various methods of treatment have been discussed with the patient and family. After consideration of risks, benefits and other options for treatment, the patient has consented to  Procedure(s): TRANSCATHETER AORTIC VALVE REPLACEMENT, TRANSFEMORAL (N/A) TRANSESOPHAGEAL ECHOCARDIOGRAM (TEE) (N/A) as a surgical intervention .  The patient's history has been reviewed, patient examined, no change in status, stable for surgery.  I have reviewed the patient's chart and labs.  Questions were answered to the patient's satisfaction.     Rexene Alberts

## 2018-05-13 ENCOUNTER — Inpatient Hospital Stay (HOSPITAL_COMMUNITY): Payer: Medicare Other

## 2018-05-13 DIAGNOSIS — I370 Nonrheumatic pulmonary valve stenosis: Secondary | ICD-10-CM

## 2018-05-13 DIAGNOSIS — I350 Nonrheumatic aortic (valve) stenosis: Principal | ICD-10-CM

## 2018-05-13 DIAGNOSIS — I361 Nonrheumatic tricuspid (valve) insufficiency: Secondary | ICD-10-CM

## 2018-05-13 DIAGNOSIS — Z952 Presence of prosthetic heart valve: Secondary | ICD-10-CM

## 2018-05-13 LAB — CBC
HCT: 35 % — ABNORMAL LOW (ref 39.0–52.0)
Hemoglobin: 11.3 g/dL — ABNORMAL LOW (ref 13.0–17.0)
MCH: 30.2 pg (ref 26.0–34.0)
MCHC: 32.3 g/dL (ref 30.0–36.0)
MCV: 93.6 fL (ref 80.0–100.0)
Platelets: 124 10*3/uL — ABNORMAL LOW (ref 150–400)
RBC: 3.74 MIL/uL — ABNORMAL LOW (ref 4.22–5.81)
RDW: 12.8 % (ref 11.5–15.5)
WBC: 5.6 10*3/uL (ref 4.0–10.5)
nRBC: 0 % (ref 0.0–0.2)

## 2018-05-13 LAB — BASIC METABOLIC PANEL
ANION GAP: 8 (ref 5–15)
BUN: 13 mg/dL (ref 8–23)
CO2: 24 mmol/L (ref 22–32)
CREATININE: 1.06 mg/dL (ref 0.61–1.24)
Calcium: 8.6 mg/dL — ABNORMAL LOW (ref 8.9–10.3)
Chloride: 102 mmol/L (ref 98–111)
GFR calc Af Amer: >60 mL/min (ref >60)
GFR calc non Af Amer: >60 mL/min (ref >60)
Glucose, Bld: 104 mg/dL — ABNORMAL HIGH (ref 70–99)
Potassium: 4.1 mmol/L (ref 3.5–5.1)
Sodium: 134 mmol/L — ABNORMAL LOW (ref 135–145)

## 2018-05-13 LAB — ECHOCARDIOGRAM COMPLETE
Height: 70 in
Weight: 4059.99 oz

## 2018-05-13 LAB — MAGNESIUM: Magnesium: 1.8 mg/dL (ref 1.7–2.4)

## 2018-05-13 MED ORDER — RIVAROXABAN 20 MG PO TABS: 20.0000 mg | ORAL_TABLET | Freq: Every day | ORAL | 2 refills | Status: DC

## 2018-05-13 MED ORDER — ASPIRIN EC 81 MG PO TBEC: 81.0000 mg | DELAYED_RELEASE_TABLET | Freq: Every day | ORAL | 0 refills | Status: DC

## 2018-05-13 NOTE — Discharge Summary (Signed)
Discharge Summary    Patient ID: Frederick Vasquez MRN: [id number]; DOB: 20-Sep-1940  Admit date: 05/12/2018 Discharge date: 05/13/2018  Primary Care Provider: Algis Greenhouse, MD  Primary Cardiologist: [name], MD  Primary Electrophysiologist:  None  Valve Clinic: Dr. Burt Knack   Discharge Diagnoses    Principal Problem:   S/P TAVR (transcatheter aortic valve replacement) Active Problems:   Aortic stenosis   Chronic diastolic CHF (congestive heart failure) (HCC)   Essential hypertension   Persistent atrial fibrillation   Severe aortic stenosis   Allergies Allergies  Allergen Reactions  . Allopurinol Rash  . Atorvastatin Rash    Diagnostic Studies/Procedures    TAVR 05/12/18- see complete OP note for details.  Post TAVR Echocardiogram 05/13/18 Study Conclusions  - Left ventricle: The cavity size was normal. Wall thickness was   increased in a pattern of moderate LVH. Systolic function was   normal. The estimated ejection fraction was in the range of 55%   to 60%. The study is not technically sufficient to allow   evaluation of LV diastolic function. - Aortic valve: Post TAVR with 26 mm Sapien 3 no significant PVL   stable gradients since implant. Valve area (VTI): 3.56 cm^2.   Valve area (Vmax): 3.18 cm^2. Valve area (Vmean): 3.49 cm^2. - Mitral valve: Severely calcified annulus. Moderately thickened,   moderately calcified leaflets . Valve area by continuity equation   (using LVOT flow): 2.54 cm^2. - Left atrium: The atrium was severely dilated. - Atrial septum: No defect or patent foramen ovale was identified.   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   Hospital Course     Pt underwent successful TAVR by Dr. Burt Knack and Dr. Roxy Manns on 05/12/18, using a 26 mm Edwards sapien 3 valve. See OP note for full procedure details. He tolerated the procedure well and left the OR in stable condition. He was monitored overnight. He had no post op complications. He denied CP and dyspnea. Post operative 2D echo showed normal systolic function with EF of 55-60% and stable AV gradients. Vital signs remained stable. Hgb stable at 11.3. Platelets 134. Bilateral groin sights stable. He ambulated with cardiac rehab w/o difficulty. He was seen and examined by Dr. Burt Knack on 05/13/18 and was felt stable for discharge home. Dr. Burt Knack has recommended he take 81 mg of ASA daily x 3 months. He was instructed to resume Xarelto night of 12/21. Dr. Burt Knack reviewed postoperative instructions with him extensively.  They discussed driving restrictions x1 week, no soaking in a tub x1 week, SBE prophylaxis, and groin care. He has hospital f/u arranged. He is scheduled to see a general cardiology APP in 1 week and is scheduled for his 1 month echo on 1/29 followed by clinic f/u with Angelena Form, PA.    Consultants: none   Discharge Vitals Blood pressure (!) 125/49, pulse 64, temperature 99.4 F (37.4 C), temperature source Oral, resp. rate 19, height 5\' 10"  (1.778 m), weight 115.1 kg, SpO2 100 %.  Filed Weights   05/12/18 1014 05/13/18 0419  Weight: 114 kg 115.1 kg    Labs & Radiologic Studies    CBC Recent Labs    05/10/18 1318  05/12/18 1444 05/13/18 0421  WBC 4.4  --   --  5.6  HGB 12.8*   < > 9.9* 11.3*  HCT 39.3   < > 29.0* 35.0*  MCV 94.7  --   --  93.6  PLT 138*  --   --  124*   < > = values in this interval not displayed.   Basic Metabolic Panel Recent Labs    05/10/18 1318  05/12/18 1444 05/13/18 0421  NA 136   < > 141 134*  K 3.9   < > 3.8 4.1  CL 103   < > 107 102  CO2 23  --   --  24  GLUCOSE 100*   < > 110* 104*  BUN  18   < > 14 13  CREATININE 0.99   < > 0.70 1.06  CALCIUM 9.1  --   --  8.6*  MG  --   --   --  1.8   < > = values in this interval not displayed.   Liver Function Tests Recent Labs    05/10/18 1318  AST 63*  ALT 51*  ALKPHOS 59  BILITOT 0.7  PROT 8.0  ALBUMIN 3.9   No results for input(s): LIPASE, AMYLASE in the last 72 hours. Cardiac Enzymes No results for input(s): CKTOTAL, CKMB, CKMBINDEX, TROPONINI in the last 72 hours. BNP Invalid input(s): POCBNP D-Dimer No results for input(s): DDIMER in the last 72 hours. Hemoglobin A1C Recent Labs    05/10/18 1316  HGBA1C 5.3   Fasting Lipid Panel No results for input(s): CHOL, HDL, LDLCALC, TRIG, CHOLHDL, LDLDIRECT in the last 72 hours. Thyroid Function Tests No results for input(s): TSH, T4TOTAL, T3FREE, THYROIDAB in the last 72 hours.  Invalid input(s): FREET3 _____________  Dg Chest 2 View  Result Date: 05/11/2018 CLINICAL DATA:  Preoperative exam for TAVR. Mild shortness of breath on exertion. EXAM: CHEST - 2 VIEW COMPARISON:  CT 04/25/2018.  Chest x-ray 03/23/2018. FINDINGS: Cardiac pacer with lead tips in right atrium and right ventricle in stable position. Stable cardiomegaly with very mild pulmonary vascular prominence and bilateral interstitial. Very mild CHF could not be excluded. Calcified pleural plaques are noted on the right. This is suggestive possibility of prior asbestos exposure. IMPRESSION: 1. Cardiac pacer noted with lead tips in right atrium and right ventricle in stable position. Cardiomegaly with very mild pulmonary vascular prominence and interstitial prominence. A very mild component CHF can not be excluded. 2. Calcified pleural plaques are again noted on the right. This may be secondary to prior asbestos exposure. Electronically Signed   By: Marcello Moores  Register   On: 05/11/2018 07:55   Ct Coronary Morph W/cta Cor Nancy Fetter W/ca W/cm &/or Wo/cm  Addendum Date: 04/25/2018   ADDENDUM REPORT: 04/25/2018 14:20  CLINICAL DATA:  77 year old male with severe aortic stenosis being evaluated for a TAVR procedure. EXAM: Cardiac TAVR CT TECHNIQUE: The patient was scanned on a [employer]. A 120 kV retrospective scan was triggered in the descending thoracic aorta at 111 HU's. Gantry rotation speed was 250 msecs and collimation was .6 mm. No beta blockade or nitro were given. The 3D data set was reconstructed in 5% intervals of the R-R cycle. Systolic and diastolic phases were analyzed on a dedicated work station using MPR, MIP and VRT modes. The patient received 80 cc of contrast. FINDINGS: Aortic Valve: Trileaflet aortic valve with severely thickened and calcified leaflets and severely restricted leaflet opening. There are moderate calcifications extending into the LVOT from under the right and left coronary sinus and continuing into  the mitral annular calcifications. Aorta: Normal size with trivial atheroma ad calcifications. No dissection. Sinotubular Junction: 32 x 31 mm Ascending Thoracic Aorta: 35 x 34 mm Aortic Arch: 34 x 31 mm Descending Thoracic Aorta: 27 x 26 mm Sinus of Valsalva Measurements: Non-coronary: 35 mm Right -coronary: 34 mm Left -coronary: 36 mm Coronary Artery Height above Annulus: Left Main: 15 mm Right Coronary: 18 mm Virtual Basal Annulus Measurements: Maximum/Minimum Diameter: 28.6 x 21.1 mm Mean Diameter: 24.4 mm Perimeter: 78.6 mm Area: 468 mm2 Optimum Fluoroscopic Angle for Delivery:  LAO 2 CAU 2 IMPRESSION: 1. Trileaflet aortic valve with severely thickened and calcified leaflets and severely restricted leaflet opening. There are moderate calcifications extending into the LVOT from under the right and left coronary sinus and continuing into the mitral annular calcifications. Annular measurement suitable for delivery of a 26 mm Edwards-SAPIEN 3 valve. 2. Sufficient coronary to annulus distance. 3. Optimum Fluoroscopic Angle for Delivery: LAO 2 CAU 2 4. No thrombus in the left atrial  appendage. 5. Mildly dilated pulmonary artery measuring 31 mm. 6. Atrial and ventricular leads are present. Electronically Signed   By: Ena Dawley   On: 04/25/2018 14:20   Result Date: 04/25/2018 EXAM: OVER-READ INTERPRETATION  CT CHEST The following report is an over-read performed by radiologist Dr. Vinnie Langton of [facility], [place] on 04/25/2018. This over-read does not include interpretation of cardiac or coronary anatomy or pathology. The coronary CTA interpretation by the cardiologist is attached. COMPARISON:  Chest CT 01/17/2018. FINDINGS: Extracardiac findings will be described separately under dictation for contemporaneously obtained CTA chest, abdomen and pelvis. IMPRESSION: Please see separate dictation for contemporaneously obtained CTA chest, abdomen and pelvis dated 04/25/2018 for full description of relevant extracardiac findings. Electronically Signed: By: Vinnie Langton M.D. On: 04/25/2018 09:34   Dg Chest Port 1 View  Result Date: 05/12/2018 CLINICAL DATA:  Transcatheter aortic valve replacement earlier today. Ex-smoker. EXAM: PORTABLE CHEST 1 VIEW COMPARISON:  05/10/2018. FINDINGS: Stable enlarged cardiac silhouette and diffusely prominent interstitial markings. Normal vasculature. Interval aortic stent valve. Stable left subclavian pacemaker leads and right calcified pleural plaques. Right jugular catheter tip in the superior vena cava. No pneumothorax. Thoracic spine degenerative changes. IMPRESSION: 1. Interval aortic stent valve in satisfactory position. 2. 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 Electronically Signed   By: Claudie Revering M.D.   On: 05/12/2018 18:07   Ct Angio Chest Aorta W &/or Wo Contrast  Result Date: 04/25/2018 CLINICAL DATA:  77 year old male with history of severe aortic stenosis. Preprocedural study prior to potential transcatheter aortic valve  replacement (TAVR) procedure. EXAM: CT ANGIOGRAPHY CHEST, ABDOMEN AND PELVIS TECHNIQUE: Multidetector CT imaging through the chest, abdomen and pelvis was performed using the standard protocol during bolus administration of intravenous contrast. Multiplanar reconstructed images and MIPs were obtained and reviewed to evaluate the vascular anatomy. CONTRAST:  161mL ISOVUE-370 IOPAMIDOL (ISOVUE-370) INJECTION 76% COMPARISON:  Chest CT 01/17/2018. CT the abdomen and pelvis 05/16/2017. FINDINGS: CTA CHEST FINDINGS Cardiovascular: Heart size is mildly enlarged. Small amount of pericardial fluid in a superior pericardial recess adjacent to the ascending thoracic aorta incidentally noted. No other significant pericardial fluid, thickening or pericardial calcification. There is aortic atherosclerosis, as well as atherosclerosis of the great vessels of the mediastinum and the coronary arteries, including calcified atherosclerotic plaque in the left anterior descending, left circumflex and right coronary arteries. Severe calcifications of the aortic valve. Severe calcifications of the mitral annulus. Left-sided  pacemaker device in place with lead tips terminating in the right atrium and right ventricular apex. Mediastinum/Lymph Nodes: No pathologically enlarged mediastinal or hilar lymph nodes. Esophagus is unremarkable in appearance. No axillary lymphadenopathy. Lungs/Pleura: Pleural thickening and multiple calcified pleural plaques in the right hemithorax, likely sequela of remote infection. No calcified pleural plaques are noted in the left hemithorax. Patchy areas of ground-glass attenuation and interlobular septal thickening are noted throughout the lungs bilaterally, favored to reflect a background of interstitial pulmonary edema. No consolidative airspace disease. No definite suspicious appearing pulmonary nodules or masses are noted. Musculoskeletal/Soft Tissues: There are no aggressive appearing lytic or blastic  lesions noted in the visualized portions of the skeleton. CTA ABDOMEN AND PELVIS FINDINGS Hepatobiliary: No suspicious cystic or solid hepatic lesions. No intra or extrahepatic biliary ductal dilatation. Several tiny partially calcified gallstones lie dependently in the gallbladder. No findings to suggest an acute cholecystitis at this time. Pancreas: Diffuse fatty atrophy throughout the pancreas. No pancreatic mass. No pancreatic ductal dilatation. No pancreatic or peripancreatic fluid or inflammatory changes. Spleen: Unremarkable. Adrenals/Urinary Tract: Bilateral kidneys and bilateral adrenal glands are normal in appearance. No hydroureteronephrosis. Urinary bladder is normal in appearance. Stomach/Bowel: Normal appearance of the stomach. No pathologic dilatation of small bowel or colon. Normal appendix. Vascular/Lymphatic: Minimal aortic atherosclerosis, without evidence of aneurysm or dissection in the abdominal or pelvic vasculature. Vascular findings and measurements pertinent to potential TAVR procedure, as detailed below. Reproductive: Prostate gland and seminal vesicles are unremarkable in appearance. Other: No significant volume of ascites.  No pneumoperitoneum. 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 There are no other more aggressive appearing lytic or blastic lesions noted in the visualized portions of the skeleton. VASCULAR MEASUREMENTS PERTINENT TO TAVR: AORTA: Minimal Aortic Diameter-17 x 18 mm Severity of Aortic Calcification-minimal RIGHT PELVIS: Right Common Iliac Artery - Minimal Diameter-10.4 x 8.7 mm Tortuosity-mild Calcification - none Right External Iliac Artery - Minimal Diameter-8.9 x 7.1 mm Tortuosity-mild-to-moderate Calcification - none Right Common Femoral Artery - Minimal Diameter-7.9 x 7.9 mm Tortuosity-mild Calcification - none LEFT PELVIS: Left Common Iliac Artery -  Minimal Diameter-10.8 x 10.1 mm Tortuosity-mild Calcification - none Left External Iliac Artery - Minimal Diameter-7.7 x 8.3 mm Tortuosity-mild-to-moderate Calcification - none Left Common Femoral Artery - Minimal Diameter-9.5 x 8.6 mm Tortuosity-mild Calcification-none Review of the MIP images confirms the above findings. IMPRESSION: 1. Vascular findings and measurements pertinent to potential TAVR procedure, as detailed above. 2. Severe thickening calcification of the aortic valve, compatible with the reported clinical history of severe aortic stenosis. 3. Cardiomegaly with evidence of interstitial pulmonary edema in the lungs, concerning for congestive heart failure. 4. Aortic atherosclerosis, in addition to left main and 3 vessel coronary artery disease. Assessment for potential risk factor modification, dietary therapy or pharmacologic therapy may be warranted, if clinically indicated. 5. There are also severe calcifications of the mitral annulus. 6. Mild pleural thickening and calcified pleural plaques in the right hemithorax, likely sequela of remote right-sided infection. 7. Additional incidental findings, as above. Electronically Signed   By: Vinnie Langton M.D.   On: 04/25/2018 10:19   Vas US Carotid  Result Date: 04/25/2018 Carotid Arterial Duplex Study Indications: Pre TAVR. Performing Technologist: Landry Mellow RDMS, RVT  Examination Guidelines: A complete evaluation includes B-mode imaging, spectral Doppler, color Doppler, and power Doppler as needed of all accessible portions of each vessel. Bilateral testing is considered an integral part  of a complete examination. Limited examinations for reoccurring indications may be performed as noted.  Right Carotid Findings: +----------+--------+--------+--------+--------+-------------------------------+           PSV cm/sEDV cm/sStenosisDescribeComments                         +----------+--------+--------+--------+--------+-------------------------------+ CCA Prox  80      13                                                      +----------+--------+--------+--------+--------+-------------------------------+ CCA Distal57      14                                                      +----------+--------+--------+--------+--------+-------------------------------+ ICA Prox  163     38      40-59%  calcificstenosis may be underestimated                                            due to shadowing from plaque    +----------+--------+--------+--------+--------+-------------------------------+ ICA Mid   84      22                                                      +----------+--------+--------+--------+--------+-------------------------------+ ICA Distal130     36                                                      +----------+--------+--------+--------+--------+-------------------------------+ ECA       125     9               calcificshadowing from plaque may                                                 obscure higher velocities       +----------+--------+--------+--------+--------+-------------------------------+ +----------+--------+-------+----------------+-------------------+           PSV cm/sEDV cmsDescribe        Arm Pressure (mmHG) +----------+--------+-------+----------------+-------------------+ Subclavian101            Multiphasic, WNL                    +----------+--------+-------+----------------+-------------------+ +---------+--------+--+--------+--+---------+ VertebralPSV cm/s60EDV cm/s17Antegrade +---------+--------+--+--------+--+---------+  Left Carotid Findings: +----------+--------+--------+--------+--------+-------------------------------+           PSV cm/sEDV cm/sStenosisDescribeComments                         +----------+--------+--------+--------+--------+-------------------------------+ CCA Prox  140     16                                                      +----------+--------+--------+--------+--------+-------------------------------+  CCA Distal77      19                      intimal thickening              +----------+--------+--------+--------+--------+-------------------------------+ ICA Prox  168     44      40-59%  calcificstenosis may be underestimated                                            due to shadowing from plaque    +----------+--------+--------+--------+--------+-------------------------------+ ICA Mid   98      25                                                      +----------+--------+--------+--------+--------+-------------------------------+ ICA Distal132     36                                                      +----------+--------+--------+--------+--------+-------------------------------+ ECA       209     28              calcific                                +----------+--------+--------+--------+--------+-------------------------------+ +----------+--------+--------+----------------+-------------------+ SubclavianPSV cm/sEDV cm/sDescribe        Arm Pressure (mmHG) +----------+--------+--------+----------------+-------------------+           165             Multiphasic, WNL                    +----------+--------+--------+----------------+-------------------+ +---------+--------+--+--------+--+---------+ VertebralPSV cm/s79EDV cm/s25Antegrade +---------+--------+--+--------+--+---------+  Summary: Right Carotid: Velocities in the right 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 Left Carotid: Velocities in the left ICA are consistent with a 40-59% stenosis.  *See table(s) above for measurements and observations.  Electronically signed by Servando Snare MD on 04/25/2018 at 3:27:58 PM.    Final    Ct Angio  Abdomen Pelvis  W &/or Wo Contrast  Result Date: 04/25/2018 CLINICAL DATA:  77 year old male with history of severe aortic stenosis. Preprocedural study prior to potential transcatheter aortic valve replacement (TAVR) procedure. EXAM: CT ANGIOGRAPHY CHEST, ABDOMEN AND PELVIS TECHNIQUE: Multidetector CT imaging through the chest, abdomen and pelvis was performed using the standard protocol during bolus administration of intravenous contrast. Multiplanar reconstructed images and MIPs were obtained and reviewed to evaluate the vascular anatomy. CONTRAST:  130mL ISOVUE-370 IOPAMIDOL (ISOVUE-370) INJECTION 76% COMPARISON:  Chest CT 01/17/2018. CT the abdomen and pelvis 05/16/2017. FINDINGS: CTA CHEST FINDINGS Cardiovascular: Heart size is mildly enlarged. Small amount of pericardial fluid in a superior pericardial recess adjacent to the ascending thoracic aorta incidentally noted. No other significant pericardial fluid, thickening or pericardial calcification. There is aortic atherosclerosis, as well as atherosclerosis of the great vessels of the mediastinum and the coronary arteries, including calcified atherosclerotic plaque in the left anterior descending,  left circumflex and right coronary arteries. Severe calcifications of the aortic valve. Severe calcifications of the mitral annulus. Left-sided pacemaker device in place with lead tips terminating in the right atrium and right ventricular apex. Mediastinum/Lymph Nodes: No pathologically enlarged mediastinal or hilar lymph nodes. Esophagus is unremarkable in appearance. No axillary lymphadenopathy. Lungs/Pleura: Pleural thickening and multiple calcified pleural plaques in the right hemithorax, likely sequela of remote infection. No calcified pleural plaques are noted in the left hemithorax. Patchy areas of ground-glass attenuation and interlobular septal thickening are noted throughout the lungs bilaterally, favored to reflect a background of interstitial pulmonary  edema. No consolidative airspace disease. No definite suspicious appearing pulmonary nodules or masses are noted. Musculoskeletal/Soft Tissues: There are no aggressive appearing lytic or blastic lesions noted in the visualized portions of the skeleton. CTA ABDOMEN AND PELVIS FINDINGS Hepatobiliary: No suspicious cystic or solid hepatic lesions. No intra or extrahepatic biliary ductal dilatation. Several tiny partially calcified gallstones lie dependently in the gallbladder. No findings to suggest an acute cholecystitis at this time. Pancreas: Diffuse fatty atrophy throughout the pancreas. No pancreatic mass. No pancreatic ductal dilatation. No pancreatic or peripancreatic fluid or inflammatory changes. Spleen: Unremarkable. Adrenals/Urinary Tract: Bilateral kidneys and bilateral adrenal glands are normal in appearance. No hydroureteronephrosis. Urinary bladder is normal in appearance. Stomach/Bowel: Normal appearance of the stomach. No pathologic dilatation of small bowel or colon. Normal appendix. Vascular/Lymphatic: Minimal aortic atherosclerosis, without evidence of aneurysm or dissection in the abdominal or pelvic vasculature. Vascular findings and measurements pertinent to potential TAVR procedure, as detailed below. Reproductive: Prostate gland and seminal vesicles are unremarkable in appearance. Other: No significant volume of ascites.  No pneumoperitoneum. 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 There are no other more aggressive appearing lytic or blastic lesions noted in the visualized portions of the skeleton. VASCULAR MEASUREMENTS PERTINENT TO TAVR: AORTA: Minimal Aortic Diameter-17 x 18 mm Severity of Aortic Calcification-minimal RIGHT PELVIS: Right Common Iliac Artery - Minimal Diameter-10.4 x 8.7 mm Tortuosity-mild Calcification - none Right External Iliac Artery - Minimal Diameter-8.9 x  7.1 mm Tortuosity-mild-to-moderate Calcification - none Right Common Femoral Artery - Minimal Diameter-7.9 x 7.9 mm Tortuosity-mild Calcification - none LEFT PELVIS: Left Common Iliac Artery - Minimal Diameter-10.8 x 10.1 mm Tortuosity-mild Calcification - none Left External Iliac Artery - Minimal Diameter-7.7 x 8.3 mm Tortuosity-mild-to-moderate Calcification - none Left Common Femoral Artery - Minimal Diameter-9.5 x 8.6 mm Tortuosity-mild Calcification-none Review of the MIP images confirms the above findings. IMPRESSION: 1. Vascular findings and measurements pertinent to potential TAVR procedure, as detailed above. 2. Severe thickening calcification of the aortic valve, compatible with the reported clinical history of severe aortic stenosis. 3. Cardiomegaly with evidence of interstitial pulmonary edema in the lungs, concerning for congestive heart failure. 4. Aortic atherosclerosis, in addition to left main and 3 vessel coronary artery disease. Assessment for potential risk factor modification, dietary therapy or pharmacologic therapy may be warranted, if clinically indicated. 5. There are also severe calcifications of the mitral annulus. 6. Mild pleural thickening and calcified pleural plaques in the right hemithorax, likely sequela of remote right-sided infection. 7. Additional incidental findings, as above. Electronically Signed   By: Vinnie Langton M.D.   On: 04/25/2018 10:19   Disposition   Pt is being discharged home today in good condition.  Follow-up Plans & Appointments    Follow-up Information    Daune Perch, NP Follow up on 05/25/2018.  Specialty:  Cardiology Why:  9:30 AM Contact information: [address] [place] [address] [place] [address] [phone]        [place] Follow up on 06/21/2018.   Specialty:  Cardiology Why:  2:00 PM for ultrasound of your heart  Contact information: [address]  [place]       Eileen Stanford, PA-C Follow up on 06/21/2018.   Specialties:  Cardiology, Radiology Why:  you are scheduled for an ultrasound of your heart at 2:00 PM and will see Dr. Antionette Char PA at 3:00 PM  Contact information: [place] [address] [phone]          Discharge Instructions    Amb Referral to Cardiac Rehabilitation   Complete by:  As directed    [place], [place]   Diagnosis:  Valve Replacement   Valve:  Aortic      Discharge Medications   Allergies as of 05/13/2018      Reactions   Allopurinol Rash   Atorvastatin Rash      Medication List    TAKE these medications   acetaminophen 500 MG tablet Commonly known as:  TYLENOL Take 1,000 mg by mouth every 6 (six) hours as needed for moderate pain or headache.   amiodarone 200 MG tablet Commonly known as:  PACERONE Take 1 tablet (200 mg total) by mouth daily.   aspirin EC 81 MG tablet Take 1 tablet (81 mg total) by mouth daily.   metoprolol tartrate 25 MG tablet Commonly known as:  LOPRESSOR TAKE 1 TABLET BY MOUTH TWICE A DAY   nitroGLYCERIN 0.4 MG SL tablet Commonly known as:  NITROSTAT Place 0.4 mg under the tongue every 5 (five) minutes as needed for chest pain.   rivaroxaban 20 MG Tabs tablet Commonly known as:  XARELTO Take 1 tablet (20 mg total) by mouth daily with supper.   rosuvastatin 20 MG tablet Commonly known as:  CRESTOR Take 20 mg by mouth at bedtime.   tamsulosin 0.4 MG Caps capsule Commonly known as:  FLOMAX Take 1 capsule (0.4 mg total) by mouth daily after supper.        Acute coronary syndrome (MI, NSTEMI, STEMI, etc) this admission?: No.    Outstanding Labs/Studies   Echocardiogram 06/21/17  Duration of Discharge Encounter   Greater than 30 minutes including physician time.  Signed, Lyda Jester, PA-C 05/13/2018, 12:35 PM

## 2018-05-13 NOTE — Plan of Care (Signed)

## 2018-05-13 NOTE — Progress Notes (Signed)
0400 vitals, patient temp 100.1. tylenol given; encouraged patient to use IS. Was able to get to 2000 on IS. Will recheck temp and continue to monitor

## 2018-05-13 NOTE — Progress Notes (Signed)
Progress Note  Patient Name: Frederick Vasquez Date of Encounter: 05/13/2018  Primary Cardiologist: [name], MD   Subjective   Patient feels well.  He has walked both yesterday evening and this morning.  He denies chest pain or shortness of breath.  Inpatient Medications    Scheduled Meds: . amiodarone  200 mg Oral Daily  . aspirin  81 mg Oral Daily  . metoprolol tartrate  25 mg Oral BID  . rivaroxaban  20 mg Oral Q supper  . sodium chloride flush  3 mL Intravenous Q12H   Continuous Infusions: . sodium chloride Stopped (05/13/18 0439)  . cefUROXime (ZINACEF)  IV 1.5 g (05/12/18 2201)  . lactated ringers 10 mL/hr at 05/12/18 1024  . nitroGLYCERIN    . phenylephrine (NEO-SYNEPHRINE) Adult infusion     PRN Meds: sodium chloride, acetaminophen, metoprolol tartrate, morphine injection, ondansetron (ZOFRAN) IV, oxyCODONE, sodium chloride flush, traMADol   Vital Signs    Vitals:   05/13/18 0021 05/13/18 0413 05/13/18 0419 05/13/18 0557  BP: 125/62 (!) 125/49    Pulse:  64    Resp: 17 19 19    Temp: 98.1 F (36.7 C) 100.1 F (37.8 C)  99.4 F (37.4 C)  TempSrc: Oral Oral  Oral  SpO2: 96% 100%    Weight:   115.1 kg   Height:        Intake/Output Summary (Last 24 hours) at 05/13/2018 0940 Last data filed at 05/13/2018 0414 Gross per 24 hour  Intake 1918.22 ml  Output 1200 ml  Net 718.22 ml   Filed Weights   05/12/18 1014 05/13/18 0419  Weight: 114 kg 115.1 kg    Telemetry    A sensed V paced rhythm 60 bpm- Personally Reviewed  ECG    A sensed V paced, heart rate 64 bpm- Personally Reviewed  Physical Exam  Alert, oriented male in no distress GEN: No acute distress.   Neck: No JVD Cardiac: RRR, no murmurs, rubs, or gallops.  Respiratory: Clear to auscultation bilaterally. GI: Soft, nontender, non-distended  MS:  Trace bilateral pretibial edema; No deformity.  Bilateral groin sites are clear Neuro:  Nonfocal  Psych: Normal affect   Labs      Chemistry Recent Labs  Lab 05/10/18 1318  05/12/18 1333 05/12/18 1444 05/13/18 0421  NA 136   < > 141 141 134*  K 3.9   < > 4.1 3.8 4.1  CL 103   < > 104 107 102  CO2 23  --   --   --  24  GLUCOSE 100*   < > 128* 110* 104*  BUN 18   < > 16 14 13   CREATININE 0.99   < > 0.80 0.70 1.06  CALCIUM 9.1  --   --   --  8.6*  PROT 8.0  --   --   --   --   ALBUMIN 3.9  --   --   --   --   AST 63*  --   --   --   --   ALT 51*  --   --   --   --   ALKPHOS 59  --   --   --   --   BILITOT 0.7  --   --   --   --   GFRNONAA >60  --   --   --  >60  GFRAA >60  --   --   --  >60  ANIONGAP 10  --   --   --  8   < > = values in this interval not displayed.     Hematology Recent Labs  Lab 05/10/18 1318  05/12/18 1333 05/12/18 1444 05/13/18 0421  WBC 4.4  --   --   --  5.6  RBC 4.15*  --   --   --  3.74*  HGB 12.8*   < > 10.5* 9.9* 11.3*  HCT 39.3   < > 31.0* 29.0* 35.0*  MCV 94.7  --   --   --  93.6  MCH 30.8  --   --   --  30.2  MCHC 32.6  --   --   --  32.3  RDW 13.2  --   --   --  12.8  PLT 138*  --   --   --  124*   < > = values in this interval not displayed.    Cardiac EnzymesNo results for input(s): TROPONINI in the last 168 hours. No results for input(s): TROPIPOC in the last 168 hours.   BNP Recent Labs  Lab 05/10/18 1318  BNP 257.0*     DDimer No results for input(s): DDIMER in the last 168 hours.   Radiology    Dg Chest Port 1 View  Result Date: 05/12/2018 CLINICAL DATA:  Transcatheter aortic valve replacement earlier today. Ex-smoker. EXAM: PORTABLE CHEST 1 VIEW COMPARISON:  05/10/2018. FINDINGS: Stable enlarged cardiac silhouette and diffusely prominent interstitial markings. Normal vasculature. Interval aortic stent valve. Stable left subclavian pacemaker leads and right calcified pleural plaques. Right jugular catheter tip in the superior vena cava. No pneumothorax. Thoracic spine degenerative changes. IMPRESSION: 1. Interval aortic stent valve in satisfactory  position. 2. 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 Electronically Signed   By: Claudie Revering M.D.   On: 05/12/2018 18:07    Cardiac Studies   Postoperative day number one 2D echocardiogram is pending.  I have reviewed the images but the formal interpretation is pending.  The patient's LV systolic function is vigorous.  There is no pericardial effusion.  The transcatheter aortic valve appears to be functioning normally with a mean transvalvular gradient of 10 mmHg and no paravalvular regurgitation.  Patient Profile     77 y.o. male with severe symptomatic aortic stenosis who presented for TAVR 05/12/2018  Assessment & Plan    1.  Severe symptomatic aortic stenosis: The patient has done very well with percutaneous transfemoral TAVR with a 26 mm Edwards sapien 3 valve.  Postoperative echo reviewed as above.  He should take aspirin 81 mg for 3 months.  He should resume rivaroxaban tonight.  I reviewed postoperative instructions with him extensively.  We discussed driving restrictions x1 week, no soaking in a tub x1 week, SBE prophylaxis, and groin care.  He has a follow-up appointment already arranged.  He is stable for discharge today. 2.  Paroxysmal atrial fibrillation: Resume rivaroxaban tonight at his normal dosing schedule.  Maintaining sinus rhythm on amiodarone. 3.  Chronic diastolic heart failure: Appears stable.  Should be improved with treatment of his severe aortic stenosis.  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 4.  Status post permanent pacemaker: Pacemaker appears to be functioning normally with atrial sensing and ventricular pacing.  Telemetry is reviewed.  Disposition: Patient stable for discharge today.  Postoperative follow-up appointments have been arranged.  For questions or updates, please contact [place] Please consult [email or web address] for  contact info under  Signed, Sherren Mocha, MD  05/13/2018, 9:40 AM

## 2018-05-13 NOTE — Progress Notes (Signed)
CARDIAC REHAB PHASE I  2787 - 1055   Pt declined walk, as he states that he has already walked twice today and he is being discharged soon. Ed complete on restrictions and infection prevention. Ed complete on nutrition (Low NA and HH). Pt referred to [facility] in [place]. Pt and family voice understanding.   Philis Kendall, MS 05/13/2018 10:48 AM

## 2018-05-13 NOTE — Progress Notes (Signed)
Discharged to home with family office visits in place teaching done  

## 2018-05-13 NOTE — Progress Notes (Signed)
Patient walked 380 ft with no issues. Sites checked and no change.

## 2018-05-13 NOTE — Progress Notes (Signed)
  Echocardiogram 2D Echocardiogram has been performed.  Frederick Vasquez 05/13/2018, 9:33 AM

## 2018-05-15 ENCOUNTER — Telehealth: Payer: Self-pay | Admitting: Cardiovascular Disease

## 2018-05-15 ENCOUNTER — Telehealth: Payer: Self-pay

## 2018-05-15 ENCOUNTER — Encounter (HOSPITAL_COMMUNITY): Payer: Self-pay | Admitting: Cardiovascular Disease

## 2018-05-15 NOTE — Telephone Encounter (Signed)
Patient contacted regarding discharge from [facility] on 05/13/2018.  Patient understands to follow up with provider Daune Perch on 05/25/18 at 9:30 AM at [facility] location. Patient understands discharge instructions? yes Patient understands medications and regiment? yes Patient understands to bring all medications to this visit? yes  The pt is doing well and was able to attend church yesterday.  The pt has already spoken with Dr Antionette Char nurse Valetta Fuller this morning in regards to dressings on groins and I reviewed this information again with the pt.

## 2018-05-15 NOTE — Telephone Encounter (Signed)
New message:    Patient calling to see if it's ok for him to take off his bandages

## 2018-05-15 NOTE — Telephone Encounter (Signed)
Brought Frederick Vasquez attention to his discharge summary instructions to page 6 for bandage and hygiene recommendations.  Instructed him to leave the clear bandage on until it falls off and not to scrub the site, only wash and pat dry. He understands not to take a soaking bath. He was grateful for call and agrees with treatment plan.

## 2018-05-18 MED FILL — Magnesium Sulfate Inj 50%: INTRAMUSCULAR | Qty: 10 | Status: AC

## 2018-05-18 MED FILL — Heparin Sodium (Porcine) Inj 1000 Unit/ML: INTRAMUSCULAR | Qty: 30 | Status: AC

## 2018-05-18 MED FILL — Potassium Chloride Inj 2 mEq/ML: INTRAVENOUS | Qty: 40 | Status: AC

## 2018-05-25 ENCOUNTER — Ambulatory Visit (INDEPENDENT_AMBULATORY_CARE_PROVIDER_SITE_OTHER): Payer: Medicare Other | Admitting: Cardiology

## 2018-05-25 ENCOUNTER — Encounter: Payer: Self-pay | Admitting: Cardiology

## 2018-05-25 VITALS — BP 138/80 | HR 78 | Ht 70.0 in | Wt 251.8 lb

## 2018-05-25 DIAGNOSIS — Z952 Presence of prosthetic heart valve: Secondary | ICD-10-CM

## 2018-05-25 DIAGNOSIS — I4819 Other persistent atrial fibrillation: Secondary | ICD-10-CM

## 2018-05-25 DIAGNOSIS — Z950 Presence of cardiac pacemaker: Secondary | ICD-10-CM | POA: Diagnosis not present

## 2018-05-25 DIAGNOSIS — I5032 Chronic diastolic (congestive) heart failure: Secondary | ICD-10-CM | POA: Diagnosis not present

## 2018-05-25 NOTE — Patient Instructions (Signed)
.Medication Instructions:  Your physician recommends that you continue on your current medications as directed. Please refer to the Current Medication list given to you today.  If you need a refill on your cardiac medications before your next appointment, please call your pharmacy.   Lab work: None  If you have labs (blood work) drawn today and your tests are completely normal, you will receive your results only by: Mar[place] Kitchen MyChart Message (if you have MyChart) OR . A paper copy in the mail If you have any lab test that is abnormal or we need to change your treatment, we will call you to review the results.  Testing/Procedures: None  Follow-Up: Keep your follow up appointment with Nell Range on 06/21/18 @ 3:00 PM  Any Other Special Instructions Will Be Listed Below (If Applicable).  DASH Eating Plan DASH stands for "Dietary Approaches to Stop Hypertension." The DASH eating plan is a healthy eating plan that has been shown to reduce high blood pressure (hypertension). It may also reduce your risk for type 2 diabetes, heart disease, and stroke. The DASH eating plan may also help with weight loss. What are tips for following this plan?  General guidelines  Avoid eating more than 2,300 mg (milligrams) of salt (sodium) a day. If you have hypertension, you may need to reduce your sodium intake to 1,500 mg a day.  Limit alcohol intake to no more than 1 drink a day for nonpregnant women and 2 drinks a day for men. One drink equals 12 oz of beer, 5 oz of wine, or 1 oz of hard liquor.  Work with your health care provider to maintain a healthy body weight or to lose weight. Ask what an ideal weight is for you.  Get at least 30 minutes of exercise that causes your heart to beat faster (aerobic exercise) most days of the week. Activities may include walking, swimming, or biking.  Work with your health care provider or diet and nutrition specialist (dietitian) to adjust your eating plan to your  individual calorie needs. Reading food labels   Check food labels for the amount of sodium per serving. Choose foods with less than 5 percent of the Daily Value of sodium. Generally, foods with less than 300 mg of sodium per serving fit into this eating plan.  To find whole grains, look for the word "whole" as the first word in the ingredient list. Shopping  Buy products labeled as "low-sodium" or "no salt added."  Buy fresh foods. Avoid canned foods and premade or frozen meals. Cooking  Avoid adding salt when cooking. Use salt-free seasonings or herbs instead of table salt or sea salt. Check with your health care provider or pharmacist before using salt substitutes.  Do not fry foods. Cook foods using healthy methods such as baking, boiling, grilling, and broiling instead.  Cook with heart-healthy oils, such as olive, canola, soybean, or sunflower oil. Meal planning  Eat a balanced diet that includes: ? 5 or more servings of fruits and vegetables each day. At each meal, try to fill half of your plate with fruits and vegetables. ? Up to 6-8 servings of whole grains each day. ? Less than 6 oz of lean meat, poultry, or fish each day. A 3-oz serving of meat is about the same size as a deck of cards. One egg equals 1 oz. ? 2 servings of low-fat dairy each day. ? A serving of nuts, seeds, or beans 5 times each week. ? Heart-healthy fats. Healthy fats  called Omega-3 fatty acids are found in foods such as flaxseeds and coldwater fish, like sardines, salmon, and mackerel.  Limit how much you eat of the following: ? Canned or prepackaged foods. ? Food that is high in trans fat, such as fried foods. ? Food that is high in saturated fat, such as fatty meat. ? Sweets, desserts, sugary drinks, and other foods with added sugar. ? Full-fat dairy products.  Do not salt foods before eating.  Try to eat at least 2 vegetarian meals each week.  Eat more home-cooked food and less restaurant,  buffet, and fast food.  When eating at a restaurant, ask that your food be prepared with less salt or no salt, if possible. What foods are recommended? The items listed may not be a complete list. Talk with your dietitian about what dietary choices are best for you. Grains Whole-grain or whole-wheat bread. Whole-grain or whole-wheat pasta. Brown rice. [place]. Bulgur. Whole-grain and low-sodium cereals. Pita bread. Low-fat, low-sodium crackers. Whole-wheat flour tortillas. Vegetables Fresh or frozen vegetables (raw, steamed, roasted, or grilled). Low-sodium or reduced-sodium tomato and vegetable juice. Low-sodium or reduced-sodium tomato sauce and tomato paste. Low-sodium or reduced-sodium canned vegetables. Fruits All fresh, dried, or frozen fruit. Canned fruit in natural juice (without added sugar). Meat and other protein foods Skinless chicken or [place]. Ground chicken or [place]. Pork with fat trimmed off. Fish and seafood. Egg whites. Dried beans, peas, or lentils. Unsalted nuts, nut butters, and seeds. Unsalted canned beans. Lean cuts of beef with fat trimmed off. Low-sodium, lean deli meat. Dairy Low-fat (1%) or fat-free (skim) milk. Fat-free, low-fat, or reduced-fat cheeses. Nonfat, low-sodium ricotta or cottage cheese. Low-fat or nonfat yogurt. Low-fat, low-sodium cheese. Fats and oils Soft margarine without trans fats. Vegetable oil. Low-fat, reduced-fat, or light mayonnaise and salad dressings (reduced-sodium). Canola, safflower, olive, soybean, and sunflower oils. Avocado. Seasoning and other foods Herbs. Spices. Seasoning mixes without salt. Unsalted popcorn and pretzels. Fat-free sweets. What foods are not recommended? The items listed may not be a complete list. Talk with your dietitian about what dietary choices are best for you. Grains Baked goods made with fat, such as croissants, muffins, or some breads. Dry pasta or rice meal packs. Vegetables Creamed or fried  vegetables. Vegetables in a cheese sauce. Regular canned vegetables (not low-sodium or reduced-sodium). Regular canned tomato sauce and paste (not low-sodium or reduced-sodium). Regular tomato and vegetable juice (not low-sodium or reduced-sodium). Angie Fava. Olives. Fruits Canned fruit in a light or heavy syrup. Fried fruit. Fruit in cream or butter sauce. Meat and other protein foods Fatty cuts of meat. Ribs. Fried meat. Berniece Salines. Sausage. Bologna and other processed lunch meats. Salami. Fatback. Hotdogs. Bratwurst. Salted nuts and seeds. Canned beans with added salt. Canned or smoked fish. Whole eggs or egg yolks. Chicken or [place] with skin. Dairy Whole or 2% milk, cream, and half-and-half. Whole or full-fat cream cheese. Whole-fat or sweetened yogurt. Full-fat cheese. Nondairy creamers. Whipped toppings. Processed cheese and cheese spreads. Fats and oils Butter. Stick margarine. Lard. Shortening. Ghee. Bacon fat. Tropical oils, such as coconut, palm kernel, or palm oil. Seasoning and other foods Salted popcorn and pretzels. Onion salt, garlic salt, seasoned salt, table salt, and sea salt. Worcestershire sauce. Tartar sauce. Barbecue sauce. Teriyaki sauce. Soy sauce, including reduced-sodium. Steak sauce. Canned and packaged gravies. Fish sauce. Oyster sauce. Cocktail sauce. Horseradish that you find on the shelf. Ketchup. Mustard. Meat flavorings and tenderizers. Bouillon cubes. Hot sauce and Tabasco sauce. Premade or packaged marinades.  Premade or packaged taco seasonings. Relishes. Regular salad dressings. Where to find more information:  National Heart, Lung, and Fults: https://wilson-eaton.com/  American Heart Association: www.heart.org Summary  The DASH eating plan is a healthy eating plan that has been shown to reduce high blood pressure (hypertension). It may also reduce your risk for type 2 diabetes, heart disease, and stroke.  With the DASH eating plan, you should limit salt (sodium)  intake to 2,300 mg a day. If you have hypertension, you may need to reduce your sodium intake to 1,500 mg a day.  When on the DASH eating plan, aim to eat more fresh fruits and vegetables, whole grains, lean proteins, low-fat dairy, and heart-healthy fats.  Work with your health care provider or diet and nutrition specialist (dietitian) to adjust your eating plan to your individual calorie needs. This information is not intended to replace advice given to you by your health care provider. Make sure you discuss any questions you have with your health care provider. Document Released: 04/29/2011 Document Revised: 05/03/2016 Document Reviewed: 05/03/2016 Elsevier Interactive Patient Education  2019 [employer].

## 2018-05-25 NOTE — Progress Notes (Signed)
Cardiology Office Note:    Date:  05/25/2018   ID:  Frederick Vasquez, DOB July 14, 1940, MRN [id number]  PCP:  Algis Greenhouse, MD  Cardiologist:  [name], MD  Referring MD: Algis Greenhouse, MD   Chief Complaint  Patient presents with  . Hospitalization Follow-up    Post TAVR    History of Present Illness:    Frederick Vasquez is a 78 y.o. male with a past medical history significant for severe AS s/p TAVR, diastolic CHF, hypertension, persistent atrial fibrillation on rivaroxaban, complete heart block s/p permanent pacemaker, pulmonary asbestosis and obesity.  Frederick Vasquez underwent successful TAVR by Dr. Burt Knack and Dr. Roxy Manns on 05/12/2018, using a 26 mm Edwards Safian 3 valve.  Postoperative 2D echo showed normal systolic function with EF of 55-60% and stable AV gradients.  Dr. Burt Knack has recommended treatment with aspirin 81 mg for 3 months.  The patient continues on Xarelto for stroke risk reduction related to A. Fib.  He is scheduled for 1 month echo on 06/21/2018.  Frederick. Vasquez is here today for postop follow-up with his wife. He is doing well. He is walking up to 5000 steps per day and tryng to work up to 10,000 steps that he used to do. He is walking briskly [place] 15 minutes [place] the mall.   Since his TAVR he feels like he can walk faster and does not have to stop to catch his breath. Even his post shingles burning on his arm has improved. Home wt is down 3 pounds. He has no edema, orthopnea, PND, lightheadedness or syncope.   Past Medical History:  Diagnosis Date  . Arthritis    bilateral hands and knees  . Atrial fibrillation ([place])    Xarelto  . Diastolic heart failure ([place])   . Dysrhythmia    A- Fib  . Hyperlipidemia   . Hypertension   . Impaired glucose tolerance   . Pacemaker   . S/P TAVR (transcatheter aortic valve replacement) 05/12/2018   26 mm Edwards Sapien 3 transcatheter heart valve placed via percutaneous right transfemoral approach     Past Surgical History:    Procedure Laterality Date  . CARDIAC CATHETERIZATION  2014   non-obs dz, done at [facility]  . CARDIOVERSION N/A 09/02/2017   Procedure: CARDIOVERSION;  Surgeon: Lelon Perla, MD;  Location: [facility] ENDOSCOPY;  Service: Cardiovascular;  Laterality: N/A;  . COLONOSCOPY    . EYE SURGERY     bilateral cataracts  . PACEMAKER IMPLANT  03/2012  . REPLACEMENT TOTAL KNEE Left   . RIGHT/LEFT HEART CATH AND CORONARY ANGIOGRAPHY N/A 03/28/2018   Procedure: RIGHT/LEFT HEART CATH AND CORONARY ANGIOGRAPHY;  Surgeon: Belva Crome, MD;  Location: [facility] CV LAB;  Service: Cardiovascular;  Laterality: N/A;  . TEE WITHOUT CARDIOVERSION N/A 05/12/2018   Procedure: TRANSESOPHAGEAL ECHOCARDIOGRAM (TEE);  Surgeon: Sherren Mocha, MD;  Location: [place];  Service: Open Heart Surgery;  Laterality: N/A;  . TRANSCATHETER AORTIC VALVE REPLACEMENT, TRANSFEMORAL N/A 05/12/2018   Procedure: TRANSCATHETER AORTIC VALVE REPLACEMENT, TRANSFEMORAL;  Surgeon: Sherren Mocha, MD;  Location: [place];  Service: Open Heart Surgery;  Laterality: N/A;  . VASECTOMY      Current Medications: Current Meds  Medication Sig  . acetaminophen (TYLENOL) 500 MG tablet Take 1,000 mg by mouth every 6 (six) hours as needed for moderate pain or headache.  Marland Kitchen amiodarone (PACERONE) 200 MG tablet Take 1 tablet (200 mg total) by mouth daily.  Marland Kitchen aspirin EC 81 MG  tablet Take 1 tablet (81 mg total) by mouth daily.  . metoprolol tartrate (LOPRESSOR) 25 MG tablet TAKE 1 TABLET BY MOUTH TWICE A DAY (Patient taking differently: Take 25 mg by mouth 2 (two) times daily. )  . nitroGLYCERIN (NITROSTAT) 0.4 MG SL tablet Place 0.4 mg under the tongue every 5 (five) minutes as needed for chest pain.  . rivaroxaban (XARELTO) 20 MG TABS tablet Take 1 tablet (20 mg total) by mouth daily with supper.  . rosuvastatin (CRESTOR) 20 MG tablet Take 20 mg by mouth at bedtime.  . tamsulosin (FLOMAX) 0.4 MG CAPS capsule Take 1 capsule (0.4 mg total) by mouth daily  after supper.     Allergies:   Allopurinol and Atorvastatin   Social History   Socioeconomic History  . Marital status: Married    Spouse name: Not on file  . Number of children: Not on file  . Years of education: Not on file  . Highest education level: Not on file  Occupational History  . Occupation: retired  Scientific laboratory technician  . Financial resource strain: Not on file  . Food insecurity:    Worry: Not on file    Inability: Not on file  . Transportation needs:    Medical: Not on file    Non-medical: Not on file  Tobacco Use  . Smoking status: Former Smoker    Last attempt to quit: 1978    Years since quitting: 42.0  . Smokeless tobacco: Never Used  Substance and Sexual Activity  . Alcohol use: No    Frequency: Never  . Drug use: No  . Sexual activity: Not on file  Lifestyle  . Physical activity:    Days per week: Not on file    Minutes per session: Not on file  . Stress: Not on file  Relationships  . Social connections:    Talks on phone: Not on file    Gets together: Not on file    Attends religious service: Not on file    Active member of club or organization: Not on file    Attends meetings of clubs or organizations: Not on file    Relationship status: Not on file  Other Topics Concern  . Not on file  Social History Narrative   Pt lives in [place]     Family History: The patient's family history includes CAD in his father; Colon cancer in his sister; Stroke in his father. ROS:   Please see the history of present illness.    All other systems reviewed and are negative.  EKGs/Labs/Other Studies Reviewed:    The following studies were reviewed today:  Post TAVR Echocardiogram 05/13/18 Study Conclusions  - Left ventricle: The cavity size was normal. Wall thickness was increased in a pattern of moderate LVH. Systolic function was normal. The estimated ejection fraction was in the range of 55% to 60%. The study is not technically sufficient to  allow evaluation of LV diastolic function. - Aortic valve: Post TAVR with 26 mm Sapien 3 no significant PVL stable gradients since implant. Valve area (VTI): 3.56 cm^2. Valve area (Vmax): 3.18 cm^2. Valve area (Vmean): 3.49 cm^2. - Mitral valve: Severely calcified annulus. Moderately thickened, moderately calcified leaflets . Valve area by continuity equation (using LVOT flow): 2.54 cm^2. - Left atrium: The atrium was severely dilated. - Atrial septum: No defect or patent foramen ovale was identified.     EKG:  EKG is ordered today.  The ekg ordered today demonstrates AV sequential pacing, 79  bpm  Recent Labs: 05/10/2018: ALT 51; B Natriuretic Peptide 257.0 05/13/2018: BUN 13; Creatinine, Ser 1.06; Hemoglobin 11.3; Magnesium 1.8; Platelets 124; Potassium 4.1; Sodium 134   Recent Lipid Panel No results found for: CHOL, TRIG, HDL, CHOLHDL, VLDL, LDLCALC, LDLDIRECT  Physical Exam:    VS:  BP 138/80   Pulse 78   Ht 5\' 10"  (1.778 m)   Wt 251 lb 12.8 oz (114.2 kg)   BMI 36.13 kg/m     Wt Readings from Last 3 Encounters:  05/25/18 251 lb 12.8 oz (114.2 kg)  05/13/18 253 lb 12 oz (115.1 kg)  05/10/18 251 lb 4.8 oz (114 kg)     Physical Exam  Constitutional: He is oriented to person, place, and time. He appears well-developed and well-nourished. No distress.  HENT:  Head: Normocephalic and atraumatic.  Neck: Normal range of motion. Neck supple. No JVD present.  Cardiovascular: Normal rate, regular rhythm, normal heart sounds and intact distal pulses. Exam reveals no gallop and no friction rub.  No murmur heard. Pulmonary/Chest: Effort normal and breath sounds normal. No respiratory distress. He has no wheezes. He has no rales.  Abdominal: Soft. Bowel sounds are normal.  Musculoskeletal: Normal range of motion.        General: No edema.  Neurological: He is alert and oriented to person, place, and time.  Skin: Skin is warm and dry.  Psychiatric: He has a normal mood  and affect. His behavior is normal. Judgment and thought content normal.  Vitals reviewed.    ASSESSMENT:    1. S/P TAVR (transcatheter aortic valve replacement)   2. Persistent atrial fibrillation   3. Chronic diastolic CHF (congestive heart failure) ([place])   4. Presence of cardiac pacemaker    PLAN:    In order of problems listed above:  1.  Severe symptomatic aortic stenosis s/p TAVR 05/12/2018 TAVR with a 26 mm Edwards Sapien 3 valve.  Postop echo with vigorous LV function and normally functioning aortic valve with a mean transvalvular gradient of 10 mmHg and no perivalvular leak. Per Dr. Burt Knack the patient should take aspirin 81 mg for 3 months and remain on rivaroxaban. SBE prophylaxis for dental work not needed as pt has no teeth.  Bilateral groins are healing well. Patient has no chest discomfort, shortness of breath or swelling. He is increasing his walking and feels better without having to stop to catch his breath.  Planned for echo and follow-up with Nell Range, PA 06/21/2018.  2.  Paroxysmal atrial fibrillation: Maintaining sinus rhythm on amiodarone.  AV pacing.  On rivaroxaban for stroke risk reduction.  3.  Chronic diastolic heart failure: Hope to see improvement since TAVR. 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  Breathing has improved somewhat since TAVR.  4.  S/p PPM for complete heart block: Followed by Dr. Curt Bears  Medication Adjustments/Labs and Tests Ordered: Current medicines are reviewed at length with the patient today.  Concerns regarding medicines are outlined above. Labs and tests ordered and medication changes are outlined in the patient instructions below:  Patient Instructions  .Medication Instructions:  Your physician recommends that you continue on your current medications as directed. Please refer to the Current Medication list given to you today.  If you need a refill on your cardiac medications before your  next appointment, please call your pharmacy.   Lab work: None  If you have labs (blood work) drawn today and your tests are completely normal, you will receive your results  only by: Marland Kitchen MyChart Message (if you have MyChart) OR . A paper copy in the mail If you have any lab test that is abnormal or we need to change your treatment, we will call you to review the results.  Testing/Procedures: None  Follow-Up: Keep your follow up appointment with Nell Range on 06/21/18 @ 3:00 PM  Any Other Special Instructions Will Be Listed Below (If Applicable).  DASH Eating Plan DASH stands for "Dietary Approaches to Stop Hypertension." The DASH eating plan is a healthy eating plan that has been shown to reduce high blood pressure (hypertension). It may also reduce your risk for type 2 diabetes, heart disease, and stroke. The DASH eating plan may also help with weight loss. What are tips for following this plan?  General guidelines  Avoid eating more than 2,300 mg (milligrams) of salt (sodium) a day. If you have hypertension, you may need to reduce your sodium intake to 1,500 mg a day.  Limit alcohol intake to no more than 1 drink a day for nonpregnant women and 2 drinks a day for men. One drink equals 12 oz of beer, 5 oz of wine, or 1 oz of hard liquor.  Work with your health care provider to maintain a healthy body weight or to lose weight. Ask what an ideal weight is for you.  Get at least 30 minutes of exercise that causes your heart to beat faster (aerobic exercise) most days of the week. Activities may include walking, swimming, or biking.  Work with your health care provider or diet and nutrition specialist (dietitian) to adjust your eating plan to your individual calorie needs. Reading food labels   Check food labels for the amount of sodium per serving. Choose foods with less than 5 percent of the Daily Value of sodium. Generally, foods with less than 300 mg of sodium per serving fit  into this eating plan.  To find whole grains, look for the word "whole" as the first word in the ingredient list. Shopping  Buy products labeled as "low-sodium" or "no salt added."  Buy fresh foods. Avoid canned foods and premade or frozen meals. Cooking  Avoid adding salt when cooking. Use salt-free seasonings or herbs instead of table salt or sea salt. Check with your health care provider or pharmacist before using salt substitutes.  Do not fry foods. Cook foods using healthy methods such as baking, boiling, grilling, and broiling instead.  Cook with heart-healthy oils, such as olive, canola, soybean, or sunflower oil. Meal planning  Eat a balanced diet that includes: ? 5 or more servings of fruits and vegetables each day. At each meal, try to fill half of your plate with fruits and vegetables. ? Up to 6-8 servings of whole grains each day. ? Less than 6 oz of lean meat, poultry, or fish each day. A 3-oz serving of meat is about the same size as a deck of cards. One egg equals 1 oz. ? 2 servings of low-fat dairy each day. ? A serving of nuts, seeds, or beans 5 times each week. ? Heart-healthy fats. Healthy fats called Omega-3 fatty acids are found in foods such as flaxseeds and coldwater fish, like sardines, salmon, and mackerel.  Limit how much you eat of the following: ? Canned or prepackaged foods. ? Food that is high in trans fat, such as fried foods. ? Food that is high in saturated fat, such as fatty meat. ? Sweets, desserts, sugary drinks, and other foods with added sugar. ?  Full-fat dairy products.  Do not salt foods before eating.  Try to eat at least 2 vegetarian meals each week.  Eat more home-cooked food and less restaurant, buffet, and fast food.  When eating at a restaurant, ask that your food be prepared with less salt or no salt, if possible. What foods are recommended? The items listed may not be a complete list. Talk with your dietitian about what dietary  choices are best for you. Grains Whole-grain or whole-wheat bread. Whole-grain or whole-wheat pasta. Brown rice. [place]. Bulgur. Whole-grain and low-sodium cereals. Pita bread. Low-fat, low-sodium crackers. Whole-wheat flour tortillas. Vegetables Fresh or frozen vegetables (raw, steamed, roasted, or grilled). Low-sodium or reduced-sodium tomato and vegetable juice. Low-sodium or reduced-sodium tomato sauce and tomato paste. Low-sodium or reduced-sodium canned vegetables. Fruits All fresh, dried, or frozen fruit. Canned fruit in natural juice (without added sugar). Meat and other protein foods Skinless chicken or [place]. Ground chicken or [place]. Pork with fat trimmed off. Fish and seafood. Egg whites. Dried beans, peas, or lentils. Unsalted nuts, nut butters, and seeds. Unsalted canned beans. Lean cuts of beef with fat trimmed off. Low-sodium, lean deli meat. Dairy Low-fat (1%) or fat-free (skim) milk. Fat-free, low-fat, or reduced-fat cheeses. Nonfat, low-sodium ricotta or cottage cheese. Low-fat or nonfat yogurt. Low-fat, low-sodium cheese. Fats and oils Soft margarine without trans fats. Vegetable oil. Low-fat, reduced-fat, or light mayonnaise and salad dressings (reduced-sodium). Canola, safflower, olive, soybean, and sunflower oils. Avocado. Seasoning and other foods Herbs. Spices. Seasoning mixes without salt. Unsalted popcorn and pretzels. Fat-free sweets. What foods are not recommended? The items listed may not be a complete list. Talk with your dietitian about what dietary choices are best for you. Grains Baked goods made with fat, such as croissants, muffins, or some breads. Dry pasta or rice meal packs. Vegetables Creamed or fried vegetables. Vegetables in a cheese sauce. Regular canned vegetables (not low-sodium or reduced-sodium). Regular canned tomato sauce and paste (not low-sodium or reduced-sodium). Regular tomato and vegetable juice (not low-sodium or reduced-sodium).  Angie Fava. Olives. Fruits Canned fruit in a light or heavy syrup. Fried fruit. Fruit in cream or butter sauce. Meat and other protein foods Fatty cuts of meat. Ribs. Fried meat. Berniece Salines. Sausage. Bologna and other processed lunch meats. Salami. Fatback. Hotdogs. Bratwurst. Salted nuts and seeds. Canned beans with added salt. Canned or smoked fish. Whole eggs or egg yolks. Chicken or [place] with skin. Dairy Whole or 2% milk, cream, and half-and-half. Whole or full-fat cream cheese. Whole-fat or sweetened yogurt. Full-fat cheese. Nondairy creamers. Whipped toppings. Processed cheese and cheese spreads. Fats and oils Butter. Stick margarine. Lard. Shortening. Ghee. Bacon fat. Tropical oils, such as coconut, palm kernel, or palm oil. Seasoning and other foods Salted popcorn and pretzels. Onion salt, garlic salt, seasoned salt, table salt, and sea salt. Worcestershire sauce. Tartar sauce. Barbecue sauce. Teriyaki sauce. Soy sauce, including reduced-sodium. Steak sauce. Canned and packaged gravies. Fish sauce. Oyster sauce. Cocktail sauce. Horseradish that you find on the shelf. Ketchup. Mustard. Meat flavorings and tenderizers. Bouillon cubes. Hot sauce and Tabasco sauce. Premade or packaged marinades. Premade or packaged taco seasonings. Relishes. Regular salad dressings. Where to find more information:  National Heart, Lung, and [place]: https://wilson-eaton.com/  American Heart Association: www.heart.org Summary  The DASH eating plan is a healthy eating plan that has been shown to reduce high blood pressure (hypertension). It may also reduce your risk for type 2 diabetes, heart disease, and stroke.  With the DASH eating plan, you  should limit salt (sodium) intake to 2,300 mg a day. If you have hypertension, you may need to reduce your sodium intake to 1,500 mg a day.  When on the DASH eating plan, aim to eat more fresh fruits and vegetables, whole grains, lean proteins, low-fat dairy, and  heart-healthy fats.  Work with your health care provider or diet and nutrition specialist (dietitian) to adjust your eating plan to your individual calorie needs. This information is not intended to replace advice given to you by your health care provider. Make sure you discuss any questions you have with your health care provider. Document Released: 04/29/2011 Document Revised: 05/03/2016 Document Reviewed: 05/03/2016 Elsevier Interactive Patient Education  2019 [place], Daune Perch, NP  05/25/2018 5:33 PM    [place]

## 2018-06-06 ENCOUNTER — Encounter: Payer: Self-pay | Admitting: Thoracic Surgery (Cardiothoracic Vascular Surgery)

## 2018-06-09 MED FILL — Heparin Sodium (Porcine) Inj 1000 Unit/ML: INTRAMUSCULAR | Qty: 30 | Status: AC

## 2018-06-09 MED FILL — Potassium Chloride Inj 2 mEq/ML: INTRAVENOUS | Qty: 40 | Status: AC

## 2018-06-09 MED FILL — Magnesium Sulfate Inj 50%: INTRAMUSCULAR | Qty: 10 | Status: AC

## 2018-06-14 ENCOUNTER — Ambulatory Visit (INDEPENDENT_AMBULATORY_CARE_PROVIDER_SITE_OTHER): Payer: Medicare Other

## 2018-06-14 DIAGNOSIS — I442 Atrioventricular block, complete: Secondary | ICD-10-CM

## 2018-06-15 NOTE — Progress Notes (Signed)
Remote pacemaker transmission.   

## 2018-06-16 ENCOUNTER — Encounter: Payer: Self-pay | Admitting: Cardiology

## 2018-06-18 LAB — CUP PACEART REMOTE DEVICE CHECK
Battery Impedance: 698 Ohm
Battery Voltage: 2.78 V
Brady Statistic AP VP Percent: 72 %
Brady Statistic AP VS Percent: 0 %
Brady Statistic AS VP Percent: 28 %
Brady Statistic AS VS Percent: 0 %
Implantable Lead Implant Date: 20131113
Implantable Lead Location: 753859
Implantable Lead Location: 753860
Implantable Lead Model: 5076
Implantable Lead Model: 5092
Implantable Pulse Generator Implant Date: 20131113
Lead Channel Impedance Value: 387 Ohm
Lead Channel Impedance Value: 686 Ohm
Lead Channel Pacing Threshold Amplitude: 0.5 V
Lead Channel Pacing Threshold Amplitude: 0.75 V
Lead Channel Pacing Threshold Pulse Width: 0.4 ms
Lead Channel Pacing Threshold Pulse Width: 0.4 ms
Lead Channel Setting Pacing Amplitude: 2.5 V
Lead Channel Setting Pacing Pulse Width: 0.4 ms
Lead Channel Setting Sensing Sensitivity: 4 mV
MDC IDC LEAD IMPLANT DT: 20131113
MDC IDC MSMT BATTERY REMAINING LONGEVITY: 68 mo
MDC IDC SESS DTM: 20200122150643
MDC IDC SET LEADCHNL RA PACING AMPLITUDE: 2 V

## 2018-06-20 ENCOUNTER — Telehealth: Payer: Self-pay

## 2018-06-20 MED ORDER — METOPROLOL TARTRATE 50 MG PO TABS: 50.0000 mg | ORAL_TABLET | Freq: Two times a day (BID) | ORAL | 3 refills | Status: DC

## 2018-06-20 NOTE — Telephone Encounter (Signed)
-----   Message from Will Meredith Leeds, MD sent at 06/19/2018  3:08 PM EST ----- Abnormal device interrogation reviewed.  Lead parameters and battery status stable.  22 seconds of nonsustained ventricular tachycardia with a heart rate of 200.  Increase metoprolol to 50 mg twice a day.

## 2018-06-20 NOTE — Telephone Encounter (Signed)
Pt advised and verbalized understanding to increase his metoprolol and having Echo 06/21/2018 pt declines palpitations, sob, and says he has been feeling well.

## 2018-06-21 ENCOUNTER — Encounter: Payer: Self-pay | Admitting: Physician Assistant

## 2018-06-21 ENCOUNTER — Ambulatory Visit (INDEPENDENT_AMBULATORY_CARE_PROVIDER_SITE_OTHER): Payer: Medicare Other | Admitting: Physician Assistant

## 2018-06-21 ENCOUNTER — Ambulatory Visit (HOSPITAL_COMMUNITY): Payer: Medicare Other | Attending: Cardiology

## 2018-06-21 VITALS — BP 129/80 | HR 84 | Ht 70.0 in | Wt 250.0 lb

## 2018-06-21 DIAGNOSIS — Z952 Presence of prosthetic heart valve: Secondary | ICD-10-CM

## 2018-06-21 NOTE — Progress Notes (Deleted)
`  HEART AND [place]                                       Cardiology Office Note    Date:  06/21/2018   ID:  Frederick Vasquez, DOB [birth year], MRN [id number]  PCP:  Algis Greenhouse, MD  Cardiologist:  Dr. Agustin Cree / Dr. Burt Knack & Dr. Roxy Manns (TAVR)  CC: 1 month s/p TAVR   History of Present Illness:  Frederick Vasquez is a 78 y.o. 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   Frederick Vasquez underwent successful TAVR with a 26 mm Edwards Safian 3 valve. Postoperative echo showed normal systolic function with EF of 55-60% and stable AV gradients. He was discharged on aspiring and Xarelto.   Today he presents to clinic for follow up.     Past Medical History:  Diagnosis Date  . Arthritis    bilateral hands and knees  . Atrial fibrillation ([place])    Xarelto  . Diastolic heart failure ([place])   . Dysrhythmia    A- Fib  . Hyperlipidemia   . Hypertension   . Impaired glucose tolerance   . Pacemaker   . 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 CARDIAC CATHETERIZATION  2014   non-obs dz, done at [facility]  . CARDIOVERSION N/A 09/02/2017   Procedure: CARDIOVERSION;  Surgeon: Lelon Perla, MD;  Location: [facility] ENDOSCOPY;  Service: Cardiovascular;  Laterality: N/A;  . COLONOSCOPY    . EYE SURGERY     bilateral cataracts  . PACEMAKER IMPLANT  03/2012  . REPLACEMENT TOTAL KNEE Left   . RIGHT/LEFT HEART CATH AND CORONARY ANGIOGRAPHY N/A 03/28/2018   Procedure: RIGHT/LEFT HEART CATH AND CORONARY ANGIOGRAPHY;  Surgeon: Belva Crome, MD;  Location: [place] CV LAB;  Service: Cardiovascular;  Laterality: N/A;    . TEE WITHOUT CARDIOVERSION N/A 05/12/2018   Procedure: TRANSESOPHAGEAL ECHOCARDIOGRAM (TEE);  Surgeon: Sherren Mocha, MD;  Location: [facility];  Service: Open Heart Surgery;  Laterality: N/A;  . TRANSCATHETER AORTIC VALVE REPLACEMENT, TRANSFEMORAL N/A 05/12/2018   Procedure: TRANSCATHETER AORTIC VALVE REPLACEMENT, TRANSFEMORAL;  Surgeon: Sherren Mocha, MD;  Location: [place];  Service: Open Heart Surgery;  Laterality: N/A;  . VASECTOMY      Current Medications: Outpatient Medications Prior to Visit  Medication Sig Dispense Refill  . acetaminophen (TYLENOL) 500 MG tablet Take 1,000 mg by mouth every 6 (six) hours as needed for moderate pain or headache.    Marland Kitchen amiodarone (PACERONE) 200 MG tablet Take 1 tablet (200 mg total) by mouth daily. 90 tablet 1  . aspirin EC 81 MG tablet Take 1 tablet (81 mg total) by mouth daily. 90 tablet 0  . metoprolol tartrate (LOPRESSOR) 50 MG tablet Take 1 tablet (50 mg total) by mouth 2 (two) times daily. 180 tablet 3  . nitroGLYCERIN (NITROSTAT) 0.4 MG SL tablet Place 0.4 mg under the tongue every 5 (five) minutes as needed for chest pain.    . rivaroxaban (XARELTO) 20 MG TABS tablet Take 1 tablet (20 mg  total) by mouth daily with supper. 90 tablet 2  . rosuvastatin (CRESTOR) 20 MG tablet Take 20 mg by mouth at bedtime.    . tamsulosin (FLOMAX) 0.4 MG CAPS capsule Take 1 capsule (0.4 mg total) by mouth daily after supper. 30 capsule 0   No facility-administered medications prior to visit.      Allergies:   Allopurinol and Atorvastatin   Social History   Socioeconomic History  . Marital status: Married    Spouse name: Not on file  . Number of children: Not on file  . Years of education: Not on file  . Highest education level: Not on file  Occupational History  . Occupation: retired  Scientific laboratory technician  . Financial resource strain: Not on file  . Food insecurity:    Worry: Not on file    Inability: Not on file  . Transportation needs:    Medical: Not on  file    Non-medical: Not on file  Tobacco Use  . Smoking status: Former Smoker    Last attempt to quit: 1978    Years since quitting: 42.1  . Smokeless tobacco: Never Used  Substance and Sexual Activity  . Alcohol use: No    Frequency: Never  . Drug use: No  . Sexual activity: Not on file  Lifestyle  . Physical activity:    Days per week: Not on file    Minutes per session: Not on file  . Stress: Not on file  Relationships  . Social connections:    Talks on phone: Not on file    Gets together: Not on file    Attends religious service: Not on file    Active member of club or organization: Not on file    Attends meetings of clubs or organizations: Not on file    Relationship status: Not on file  Other Topics Concern  . Not on file  Social History Narrative   Pt lives in [place]     Family History:  The patient's family history includes CAD in his father; Colon cancer in his sister; Stroke in his father.      *** ROS/PE    Wt Readings from Last 3 Encounters:  05/25/18 251 lb 12.8 oz (114.2 kg)  05/13/18 253 lb 12 oz (115.1 kg)  05/10/18 251 lb 4.8 oz (114 kg)      Studies/Labs Reviewed:   EKG:  EKG is*** ordered today.  The ekg ordered today demonstrates ***  Recent Labs: 05/10/2018: ALT 51; B Natriuretic Peptide 257.0 05/13/2018: BUN 13; Creatinine, Ser 1.06; Hemoglobin 11.3; Magnesium 1.8; Platelets 124; Potassium 4.1; Sodium 134   Lipid Panel No results found for: CHOL, TRIG, HDL, CHOLHDL, VLDL, LDLCALC, LDLDIRECT  Additional studies/ records that were reviewed today include:   TAVR OPERATIVE NOTE  Date of Procedure:                05/12/2018  Preoperative Diagnosis:      Severe Aortic Stenosis   Postoperative Diagnosis:    Same   Procedure:        Transcatheter Aortic Valve Replacement - Percutaneous  Transfemoral Approach             Edwards Sapien 3 THV (size 26 mm, model # 9600TFX, serial #[id number])              Co-Surgeons:                         Valentina Gu.  Roxy Manns, MD and Sherren Mocha, MD  Anesthesiologist:                  Laurie Panda, MD  Echocardiographer:              Sanda Klein, MD  Pre-operative Echo Findings: ? severe aortic stenosis ? Normal left ventricular systolic function  Post-operative Echo Findings: ? No paravalvular leak ? Normal/unchanged left ventricular systolic function   ____________   Echocardiogram 05/13/18 Study Conclusions - Left ventricle: The cavity size was normal. Wall thickness was increased in a pattern of moderate LVH. Systolic function was normal. The estimated ejection fraction was in the range of 55% to 60%. The study is not technically sufficient to allow evaluation of LV diastolic function. - Aortic valve: Post TAVR with 26 mm Sapien 3 no significant PVL stable gradients since implant. Valve area (VTI): 3.56 cm^2. Valve area (Vmax): 3.18 cm^2. Valve area (Vmean): 3.49 cm^2. - Mitral valve: Severely calcified annulus. Moderately thickened, moderately calcified leaflets . Valve area by continuity equation (using LVOT flow): 2.54 cm^2. - Left atrium: The atrium was severely dilated. - Atrial septum: No defect or patent foramen ovale was identified   ASSESSMENT & PLAN:       Medication Adjustments/Labs and Tests Ordered: Current medicines are reviewed at length with the patient today.  Concerns regarding medicines are outlined above.  Medication changes, Labs and Tests ordered today are listed in the Patient Instructions below. There are no Patient Instructions on file for this visit.   Signed, Angelena Form, PA-C  06/21/2018 12:52 PM    [facility] [address], [place], [place]  [address] Phone: [phone]; Fax: [phone]

## 2018-06-21 NOTE — Patient Instructions (Addendum)
Medication Instructions:  Your physician has recommended you make the following change in your medication:   You may stop Aspirin after 08/11/18   If you need a refill on your cardiac medications before your next appointment, please call your pharmacy.   Lab work: None Ordered  If you have labs (blood work) drawn today and your tests are completely normal, you will receive your results only by: Marland Kitchen MyChart Message (if you have MyChart) OR . A paper copy in the mail If you have any lab test that is abnormal or we need to change your treatment, we will call you to review the results.  Testing/Procedures: Your physician has requested that you have an echocardiogram on 06/20/19 at 2:00 PM. Please arrive at 1:30 PM. Echocardiography is a painless test that uses sound waves to create images of your heart. It provides your doctor with information about the size and shape of your heart and how well your heart's chambers and valves are working. This procedure takes approximately one hour. There are no restrictions for this procedure.    Follow-Up: . Follow up with Dr. Fraser Din on 08/21/18 at 8:00 AM .  . Follow up [place], [place] on 06/20/19 at 3:30 PM right after echocardiogram  Any Other Special Instructions Will Be Listed Below (If Applicable).

## 2018-06-21 NOTE — Progress Notes (Signed)
`  HEART AND [place]                                       Cardiology Office Note    Date:  06/21/2018   ID:  Frederick Vasquez, DOB 08-18-1940, MRN [id number]  PCP:  Algis Greenhouse, MD  Cardiologist:  Dr. Agustin Cree / Dr. Burt Knack & Dr. Roxy Manns (TAVR)  CC: 1 month s/p TAVR   History of Present Illness:  Frederick Vasquez is a 78 y.o. 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   He underwent successful TAVR with a 26 mm Edwards Safian 3 valve. Postoperative echo showed normal systolic function with EF of 55-60% and stable AV gradients. He was discharged on aspiring and Xarelto.   Today he presents to clinic for follow up. He used to have to stop several times on his way to the mailbox and now he can walk all the way to the mailbox with no issues. He still gets some exertional chest tightness with vigorous activity, but it is much improved than previous. He can tell a big difference in the the way that he feels since having TAVR. He is working with cardiac rehab and doing well. No LE edema, orthopnea or PND. No dizziness or syncope. No blood in stool or urine. No palpitations.    Past Medical History:  Diagnosis Date  . Arthritis    bilateral hands and knees  . Atrial fibrillation ([place])    Xarelto  . Diastolic heart failure ([place])   . Dysrhythmia    A- Fib  . Hyperlipidemia   . Hypertension   . Impaired glucose tolerance   . Pacemaker   . 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 CARDIAC CATHETERIZATION  2014   non-obs dz, done at [facility]  . CARDIOVERSION N/A 09/02/2017   Procedure: CARDIOVERSION;   Surgeon: Lelon Perla, MD;  Location: [facility] ENDOSCOPY;  Service: Cardiovascular;  Laterality: N/A;  . COLONOSCOPY    . EYE SURGERY     bilateral cataracts  . PACEMAKER IMPLANT  03/2012  . REPLACEMENT TOTAL KNEE Left   . RIGHT/LEFT HEART CATH AND CORONARY ANGIOGRAPHY N/A 03/28/2018   Procedure: RIGHT/LEFT HEART CATH AND CORONARY ANGIOGRAPHY;  Surgeon: Belva Crome, MD;  Location: [place] CV LAB;  Service: Cardiovascular;  Laterality: N/A;  . TEE WITHOUT CARDIOVERSION N/A 05/12/2018   Procedure: TRANSESOPHAGEAL ECHOCARDIOGRAM (TEE);  Surgeon: Sherren Mocha, MD;  Location: [facility];  Service: Open Heart Surgery;  Laterality: N/A;  . TRANSCATHETER AORTIC VALVE REPLACEMENT, TRANSFEMORAL N/A 05/12/2018   Procedure: TRANSCATHETER AORTIC VALVE REPLACEMENT, TRANSFEMORAL;  Surgeon: Sherren Mocha, MD;  Location: [facility];  Service: Open Heart Surgery;  Laterality: N/A;  . VASECTOMY      Current Medications: Outpatient Medications Prior to Visit  Medication Sig Dispense Refill  . acetaminophen (TYLENOL) 500 MG tablet Take 1,000 mg by mouth every 6 (six) hours as needed for moderate pain or headache.    Marland Kitchen amiodarone (PACERONE) 200 MG tablet Take 1 tablet (200 mg total) by  mouth daily. 90 tablet 1  . metoprolol tartrate (LOPRESSOR) 50 MG tablet Take 1 tablet (50 mg total) by mouth 2 (two) times daily. 180 tablet 3  . nitroGLYCERIN (NITROSTAT) 0.4 MG SL tablet Place 0.4 mg under the tongue every 5 (five) minutes as needed for chest pain.    . rivaroxaban (XARELTO) 20 MG TABS tablet Take 1 tablet (20 mg total) by mouth daily with supper. 90 tablet 2  . rosuvastatin (CRESTOR) 20 MG tablet Take 20 mg by mouth at bedtime.    . tamsulosin (FLOMAX) 0.4 MG CAPS capsule Take 1 capsule (0.4 mg total) by mouth daily after supper. 30 capsule 0  . aspirin EC 81 MG tablet Take 1 tablet (81 mg total) by mouth daily. 90 tablet 0   No facility-administered medications prior to visit.      Allergies:    Allopurinol and Atorvastatin   Social History   Socioeconomic History  . Marital status: Married    Spouse name: Not on file  . Number of children: Not on file  . Years of education: Not on file  . Highest education level: Not on file  Occupational History  . Occupation: retired  Scientific laboratory technician  . Financial resource strain: Not on file  . Food insecurity:    Worry: Not on file    Inability: Not on file  . Transportation needs:    Medical: Not on file    Non-medical: Not on file  Tobacco Use  . Smoking status: Former Smoker    Last attempt to quit: 1978    Years since quitting: 42.1  . Smokeless tobacco: Never Used  Substance and Sexual Activity  . Alcohol use: No    Frequency: Never  . Drug use: No  . Sexual activity: Not on file  Lifestyle  . Physical activity:    Days per week: Not on file    Minutes per session: Not on file  . Stress: Not on file  Relationships  . Social connections:    Talks on phone: Not on file    Gets together: Not on file    Attends religious service: Not on file    Active member of club or organization: Not on file    Attends meetings of clubs or organizations: Not on file    Relationship status: Not on file  Other Topics Concern  . Not on file  Social History Narrative   Pt lives in [place]     Family History:  The patient's family history includes CAD in his father; Colon cancer in his sister; Stroke in his father.      ROS:   Please see the history of present illness.    ROS All other systems reviewed and are negative.   PHYSICAL EXAM:   VS:  BP 129/80   Pulse 84   Ht 5\' 10"  (1.778 m)   Wt 250 lb (113.4 kg)   SpO2 95%   BMI 35.87 kg/m    GEN: Well nourished, well developed, in no acute distress HEENT: normal Neck: no JVD or masses Cardiac: RRR; soft flow murmur. No rubs, or gallops,no edema  Respiratory:  clear to auscultation bilaterally, normal work of breathing GI: soft, nontender, nondistended, + BS MS: no  deformity or atrophy Skin: warm and dry, no rash Neuro:  Alert and Oriented x 3, Strength and sensation are intact Psych: euthymic mood, full affect   Wt Readings from Last 3 Encounters:  06/21/18 250 lb (113.4 kg)  05/25/18  251 lb 12.8 oz (114.2 kg)  05/13/18 253 lb 12 oz (115.1 kg)      Studies/Labs Reviewed:   EKG:  EKG is NOT ordered today.    Recent Labs: 05/10/2018: ALT 51; B Natriuretic Peptide 257.0 05/13/2018: BUN 13; Creatinine, Ser 1.06; Hemoglobin 11.3; Magnesium 1.8; Platelets 124; Potassium 4.1; Sodium 134   Lipid Panel No results found for: CHOL, TRIG, HDL, CHOLHDL, VLDL, LDLCALC, LDLDIRECT  Additional studies/ records that were reviewed today include:   TAVR OPERATIVE NOTE  Date of Procedure:                05/12/2018  Preoperative Diagnosis:      Severe Aortic Stenosis   Postoperative Diagnosis:    Same   Procedure:        Transcatheter Aortic Valve Replacement - Percutaneous  Transfemoral Approach             Edwards Sapien 3 THV (size 26 mm, model # 9600TFX, serial #[id number])              Co-Surgeons:                        Valentina Gu. Roxy Manns, MD and Sherren Mocha, MD  Anesthesiologist:                  Laurie Panda, MD  Echocardiographer:              Sanda Klein, MD  Pre-operative Echo Findings: ? severe aortic stenosis ? Normal left ventricular systolic function  Post-operative Echo Findings: ? No paravalvular leak ? Normal/unchanged left ventricular systolic function   ____________   Echocardiogram 05/13/18 Study Conclusions - Left ventricle: The cavity size was normal. Wall thickness was increased in a pattern of moderate LVH. Systolic function was normal. The estimated ejection fraction was in the range of 55% to 60%. The study is not technically sufficient to allow evaluation of LV diastolic function. - Aortic valve: Post TAVR with 26 mm Sapien 3 no significant PVL stable gradients since implant. Valve  area (VTI): 3.56 cm^2. Valve area (Vmax): 3.18 cm^2. Valve area (Vmean): 3.49 cm^2. - Mitral valve: Severely calcified annulus. Moderately thickened, moderately calcified leaflets . Valve area by continuity equation (using LVOT flow): 2.54 cm^2. - Left atrium: The atrium was severely dilated. - Atrial septum: No defect or patent foramen ovale was identified   ______________  Echo 06/21/18 IMPRESSIONS  1. The left ventricle appears to be normal in size, have moderate wall thickness, with normal systolic function of 10-93%. Echo evidence of pseudonormal in diastolic filling patterns.  2. A 26 mm an Edwards Edwards Sapien bioprosthetic aortic valve valve is present in the aortic position. The prosthesis was placed on 05/12/18. Mean gradient is 14 mmHg.  3. Echo shows normal structure and function of the aortic prosthesis.  4. Aortic valve tricuspid.  5. Aortic valve regurgitation is mild by color flow Doppler.  6. Right ventricular systolic pressure is normal.  7. The right ventricle is normal in size, has normal wall thickness and normal systolic function.  8. Severely dilated left atrial size.  9. Mildly dilated right atrial size. 10. Severe mitral annular calcification. 11. The mitral valve normal in structure and function. 12. There is moderate thickening of the mitral valve. 13. There is moderately calcified of the mitral valve. 14. Normal tricuspid valve. 15. Pulmonic valve regurgitation is mild by color flow Doppler. 16. Mild dilatation of the ascending aorta. 17. No  atrial level shunt detected by color flow Doppler.   ASSESSMENT & PLAN:   Severe AS s/p TAVR: echo today shows EF 60% with normally functioning TAVR with mean gradient of 14 mmHg. He has NYHA class II symptoms of shortness of breath and fatigue but symptoms are much improved from previous. SBE prophylaxis discussed; the patient is edentulous and does not go to the dentist.  ASA can be discontinued after 3  months of therapy and he will continue on Xarelto from thromboembolic prophylaxis for atrial fibrillation    Medication Adjustments/Labs and Tests Ordered: Current medicines are reviewed at length with the patient today.  Concerns regarding medicines are outlined above.  Medication changes, Labs and Tests ordered today are listed in the Patient Instructions below. Patient Instructions  Medication Instructions:  Your physician has recommended you make the following change in your medication:   You may stop Aspirin after 08/11/18   If you need a refill on your cardiac medications before your next appointment, please call your pharmacy.   Lab work: None Ordered  If you have labs (blood work) drawn today and your tests are completely normal, you will receive your results only by: Marland Kitchen MyChart Message (if you have MyChart) OR . A paper copy in the mail If you have any lab test that is abnormal or we need to change your treatment, we will call you to review the results.  Testing/Procedures: Your physician has requested that you have an echocardiogram on 06/20/19 at 2:00 PM. Please arrive at 1:30 PM. Echocardiography is a painless test that uses sound waves to create images of your heart. It provides your doctor with information about the size and shape of your heart and how well your heart's chambers and valves are working. This procedure takes approximately one hour. There are no restrictions for this procedure.    Follow-Up: . Follow up with Dr. Fraser Din on 08/21/18 at 8:00 AM .  . Follow up [place], [place] on 06/20/19 at 3:30 PM right after echocardiogram  Any Other Special Instructions Will Be Listed Below (If Applicable).       Signed, Angelena Form, PA-C  06/21/2018 10:12 PM    [facility] [place], [place], [place]  [address] Phone: [phone]; Fax: [phone]

## 2018-06-28 ENCOUNTER — Encounter: Payer: Self-pay | Admitting: Thoracic Surgery (Cardiothoracic Vascular Surgery)

## 2018-08-16 ENCOUNTER — Telehealth: Payer: Self-pay | Admitting: Emergency Medicine

## 2018-08-16 NOTE — Telephone Encounter (Signed)
Patient agreed to switch appointment to televisit and to switch to tomorrow 08/17/2018 at 2 pm.

## 2018-08-17 ENCOUNTER — Other Ambulatory Visit: Payer: Self-pay

## 2018-08-17 ENCOUNTER — Ambulatory Visit (INDEPENDENT_AMBULATORY_CARE_PROVIDER_SITE_OTHER): Payer: Medicare Other | Admitting: Cardiology

## 2018-08-17 ENCOUNTER — Encounter: Payer: Self-pay | Admitting: Cardiology

## 2018-08-17 VITALS — BP 139/79 | HR 61 | Wt 250.2 lb

## 2018-08-17 DIAGNOSIS — I4819 Other persistent atrial fibrillation: Secondary | ICD-10-CM

## 2018-08-17 DIAGNOSIS — Z950 Presence of cardiac pacemaker: Secondary | ICD-10-CM | POA: Diagnosis not present

## 2018-08-17 DIAGNOSIS — I5032 Chronic diastolic (congestive) heart failure: Secondary | ICD-10-CM

## 2018-08-17 DIAGNOSIS — Z952 Presence of prosthetic heart valve: Secondary | ICD-10-CM | POA: Diagnosis not present

## 2018-08-17 MED ORDER — ROSUVASTATIN CALCIUM 20 MG PO TABS: 20.0000 mg | ORAL_TABLET | Freq: Every day | ORAL | 1 refills | Status: DC

## 2018-08-17 MED ORDER — RIVAROXABAN 20 MG PO TABS: 20.0000 mg | ORAL_TABLET | Freq: Every day | ORAL | 1 refills | Status: DC

## 2018-08-17 MED ORDER — METOPROLOL TARTRATE 50 MG PO TABS: 50.0000 mg | ORAL_TABLET | Freq: Two times a day (BID) | ORAL | 1 refills | Status: DC

## 2018-08-17 MED ORDER — AMIODARONE HCL 200 MG PO TABS: 200.0000 mg | ORAL_TABLET | Freq: Every day | ORAL | 1 refills | Status: DC

## 2018-08-17 NOTE — Patient Instructions (Signed)
Medication Instructions:  Your physician recommends that you continue on your current medications as directed. Please refer to the Current Medication list given to you today.  If you need a refill on your cardiac medications before your next appointment, please call your pharmacy.   Lab work: None.  If you have labs (blood work) drawn today and your tests are completely normal, you will receive your results only by: . MyChart Message (if you have MyChart) OR . A paper copy in the mail If you have any lab test that is abnormal or we need to change your treatment, we will call you to review the results.  Testing/Procedures: None.   Follow-Up: At [facility], you and your health needs are our priority.  As part of our continuing mission to provide you with exceptional heart care, we have created designated Provider Care Teams.  These Care Teams include your primary Cardiologist (physician) and Advanced Practice Providers (APPs -  Physician Assistants and Nurse Practitioners) who all work together to provide you with the care you need, when you need it. You will need a follow up appointment in 2 months.  Please call our office 2 months in advance to schedule this appointment.  You may see Robert Krasowski, MD or another member of our [facility] Provider Team in [place]: Brian Munley, MD . Rajan Revankar, MD  Any Other Special Instructions Will Be Listed Below (If Applicable).     

## 2018-08-17 NOTE — Progress Notes (Signed)
This is a phone visit: Evaluation Performed:  Follow-up visit  This visit type was conducted due to national recommendations for restrictions regarding the COVID-19 Pandemic (e.g. social distancing).  This format is felt to be most appropriate for this patient at this time.  All issues noted in this document were discussed and addressed.  No physical exam was performed (except for noted visual exam findings with Video Visits).  Please refer to the patient's chart (MyChart message for video visits and phone note for telephone visits) for the patient's consent to telehealth for [facility].  Date:  08/17/2018  ID: Frederick Vasquez, DOB 11-Jan-1941, MRN [id number]   Patient Location:  [address] [place] [address]   Provider location:   [place] Office  PCP:  Algis Greenhouse, MD  Cardiologist:  [name], MD     Chief Complaint: Doing well  History of Present Illness:    Frederick Vasquez is a 78 y.o. male  who presents via audio/video conferencing for a telehealth visit today.  We will get TAVR in done in January.  Overall he is doing well he said he feels much better much stronger denies having any chest pain tightness squeezing pressure burning chest.  He is able to walk and get much less shortness of breath than before still some but overall improved very happy and satisfied with the service he received at [facility].  Denies having any palpitations no swelling of lower extremities.   The patient does not symptoms concerning for COVID-19 infection (fever, chills, cough, or new SHORTNESS OF BREATH).    Prior CV studies:   The following studies were reviewed today:  I reviewed echocardiogram from to have procedure     Past Medical History:  Diagnosis Date  . Arthritis    bilateral hands and knees  . Atrial fibrillation ([place])    Xarelto  . Diastolic heart failure ([place])   . Dysrhythmia    A- Fib  . Hyperlipidemia   . Hypertension   . Impaired  glucose tolerance   . Pacemaker   . 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 CARDIAC CATHETERIZATION  2014   non-obs dz, done at [facility]  . CARDIOVERSION N/A 09/02/2017   Procedure: CARDIOVERSION;  Surgeon: Lelon Perla, MD;  Location: [facility] ENDOSCOPY;  Service: Cardiovascular;  Laterality: N/A;  . COLONOSCOPY    . EYE SURGERY     bilateral cataracts  . PACEMAKER IMPLANT  03/2012  . REPLACEMENT TOTAL KNEE Left   . RIGHT/LEFT HEART CATH AND CORONARY ANGIOGRAPHY N/A 03/28/2018   Procedure: RIGHT/LEFT HEART CATH AND CORONARY ANGIOGRAPHY;  Surgeon: Belva Crome, MD;  Location: [place] CV LAB;  Service: Cardiovascular;  Laterality: N/A;  . TEE WITHOUT CARDIOVERSION N/A 05/12/2018   Procedure: TRANSESOPHAGEAL ECHOCARDIOGRAM (TEE);  Surgeon: Sherren Mocha, MD;  Location: [place];  Service: Open Heart Surgery;  Laterality: N/A;  . TRANSCATHETER AORTIC VALVE REPLACEMENT, TRANSFEMORAL N/A 05/12/2018   Procedure: TRANSCATHETER AORTIC VALVE REPLACEMENT, TRANSFEMORAL;  Surgeon: Sherren Mocha, MD;  Location: [place];  Service: Open Heart Surgery;  Laterality: N/A;  . VASECTOMY       Current Meds  Medication Sig  . acetaminophen (TYLENOL) 500 MG tablet Take 1,000 mg by mouth every 6 (six) hours as needed for moderate pain  or headache.  Marland Kitchen amiodarone (PACERONE) 200 MG tablet Take 1 tablet (200 mg total) by mouth daily.  . metoprolol tartrate (LOPRESSOR) 50 MG tablet Take 1 tablet (50 mg total) by mouth 2 (two) times daily.  . nitroGLYCERIN (NITROSTAT) 0.4 MG SL tablet Place 0.4 mg under the tongue every 5 (five) minutes as needed for chest pain.  . rivaroxaban (XARELTO) 20 MG TABS tablet Take 1 tablet (20 mg total) by mouth daily with supper.  . rosuvastatin (CRESTOR) 20 MG tablet Take 1 tablet (20 mg total) by  mouth at bedtime.  . tamsulosin (FLOMAX) 0.4 MG CAPS capsule Take 1 capsule (0.4 mg total) by mouth daily after supper.  . [DISCONTINUED] amiodarone (PACERONE) 200 MG tablet Take 1 tablet (200 mg total) by mouth daily.  . [DISCONTINUED] metoprolol tartrate (LOPRESSOR) 50 MG tablet Take 1 tablet (50 mg total) by mouth 2 (two) times daily.  . [DISCONTINUED] rivaroxaban (XARELTO) 20 MG TABS tablet Take 1 tablet (20 mg total) by mouth daily with supper.  . [DISCONTINUED] rosuvastatin (CRESTOR) 20 MG tablet Take 20 mg by mouth at bedtime.      Family History: The patient's family history includes CAD in his father; Colon cancer in his sister; Stroke in his father.   ROS:   Please see the history of present illness.     All other systems reviewed and are negative.   Labs/Other Tests and Data Reviewed:     Recent Labs: 05/10/2018: ALT 51; B Natriuretic Peptide 257.0 05/13/2018: BUN 13; Creatinine, Ser 1.06; Hemoglobin 11.3; Magnesium 1.8; Platelets 124; Potassium 4.1; Sodium 134  Recent Lipid Panel No results found for: CHOL, TRIG, HDL, CHOLHDL, VLDL, LDLCALC, LDLDIRECT    Exam:    Vital Signs:  Wt 141 lb (64 kg)   BMI 24.20 kg/m    Well nourished, well developed male in no acute distress. Overall he denies having any worrisome signs  Diagnosis for this visit:   No diagnosis found.   ASSESSMENT & PLAN:    1.  Status post aortic valve TAVR procedure.  Overall seems to be doing very well feeling better less shortness of breath stronger.  Denies have any chest pain tightness squeezing pressure burning chest he tells me that his groin access area is looking good healed completely with no complications. 2.  Persistent atrial fibrillation.  Denies having any palpitations.  We will continue with Xarelto. 3.  Pacemaker present: Followed by our EP clinic.   COVID-19 Education: The signs and symptoms of COVID-19 were discussed with the patient and how to seek care for testing  (follow up with PCP or arrange E-visit).  The importance of social distancing was discussed today.  Patient Risk:   After full review of this patients clinical status, I feel that they are at least moderate risk at this time.  Time:   Today, I have spent 16 minutes with the patient with telehealth technology discussing pt health issues.     Medication Adjustments/Labs and Tests Ordered: Current medicines are reviewed at length with the patient today.  Concerns regarding medicines are outlined above.  No orders of the defined types were placed in this encounter.  Medication changes:  Meds ordered this encounter  Medications  . amiodarone (PACERONE) 200 MG tablet    Sig: Take 1 tablet (200 mg total) by mouth daily.    Dispense:  90 tablet    Refill:  1  . metoprolol tartrate (LOPRESSOR) 50 MG tablet    Sig: Take 1  tablet (50 mg total) by mouth 2 (two) times daily.    Dispense:  180 tablet    Refill:  1    Dose change  . rivaroxaban (XARELTO) 20 MG TABS tablet    Sig: Take 1 tablet (20 mg total) by mouth daily with supper.    Dispense:  90 tablet    Refill:  1  . rosuvastatin (CRESTOR) 20 MG tablet    Sig: Take 1 tablet (20 mg total) by mouth at bedtime.    Dispense:  90 tablet    Refill:  1     Disposition: Follow-up with VD conference in about 2 months  Signed, Park Liter, MD, [facility] 08/17/2018 11:39 AM    [place]

## 2018-08-18 ENCOUNTER — Ambulatory Visit: Payer: Medicare Other | Admitting: Cardiology

## 2018-08-21 ENCOUNTER — Ambulatory Visit: Payer: Medicare Other | Admitting: Cardiology

## 2018-09-11 ENCOUNTER — Telehealth: Payer: Self-pay | Admitting: Cardiology

## 2018-09-11 NOTE — Telephone Encounter (Signed)
°*  STAT* If patient is at the pharmacy, call can be transferred to refill team.   1. Which medications need to be refilled? (please list name of each medication and dose if known) rivaroxaban (XARELTO) 20 MG TABS   2. Which pharmacy/location (including street and city if local pharmacy) is medication to be sent to?  [facility] [place], [address] AT [address] [phone] (Phone) [phone] (Fax)    3. Do they need a 30 day or 90 day supply? 90 day

## 2018-09-12 MED ORDER — RIVAROXABAN 20 MG PO TABS: 20.0000 mg | ORAL_TABLET | Freq: Every day | ORAL | 1 refills | Status: DC

## 2018-09-12 NOTE — Telephone Encounter (Signed)
Xarelto refill sent to [facility] on [address] Tia Alert per pt preference

## 2018-09-13 ENCOUNTER — Encounter: Payer: Medicare Other | Admitting: *Deleted

## 2018-09-13 ENCOUNTER — Telehealth: Payer: Self-pay | Admitting: Cardiology

## 2018-09-13 ENCOUNTER — Other Ambulatory Visit: Payer: Self-pay

## 2018-09-13 NOTE — Telephone Encounter (Signed)
LMOVM for pt to return call 

## 2018-09-13 NOTE — Telephone Encounter (Signed)
New Message:    Pt said he tried to transmit, he received a code of [id number].

## 2018-09-13 NOTE — Telephone Encounter (Signed)
Pt called back. Attempted to help pt trouble shoot monitor and send transmission. After 1 unsuccessful attempt I instructed pt to call tech support. Pt verbalized understanding.

## 2018-09-19 ENCOUNTER — Ambulatory Visit (INDEPENDENT_AMBULATORY_CARE_PROVIDER_SITE_OTHER): Payer: Medicare Other | Admitting: *Deleted

## 2018-09-19 DIAGNOSIS — I5032 Chronic diastolic (congestive) heart failure: Secondary | ICD-10-CM

## 2018-09-19 DIAGNOSIS — I442 Atrioventricular block, complete: Secondary | ICD-10-CM

## 2018-09-20 LAB — CUP PACEART REMOTE DEVICE CHECK
Battery Impedance: 749 Ohm
Battery Remaining Longevity: 66 mo
Battery Voltage: 2.78 V
Brady Statistic AP VP Percent: 75 %
Brady Statistic AP VS Percent: 0 %
Brady Statistic AS VP Percent: 25 %
Brady Statistic AS VS Percent: 0 %
Date Time Interrogation Session: 20200428233142
Implantable Lead Implant Date: 20131113
Implantable Lead Implant Date: 20131113
Implantable Lead Location: 753859
Implantable Lead Location: 753860
Implantable Lead Model: 5076
Implantable Lead Model: 5092
Implantable Pulse Generator Implant Date: 20131113
Lead Channel Impedance Value: 383 Ohm
Lead Channel Impedance Value: 723 Ohm
Lead Channel Pacing Threshold Amplitude: 0.5 V
Lead Channel Pacing Threshold Amplitude: 0.75 V
Lead Channel Pacing Threshold Pulse Width: 0.4 ms
Lead Channel Pacing Threshold Pulse Width: 0.4 ms
Lead Channel Setting Pacing Amplitude: 2 V
Lead Channel Setting Pacing Amplitude: 2.5 V
Lead Channel Setting Pacing Pulse Width: 0.4 ms
Lead Channel Setting Sensing Sensitivity: 4 mV

## 2018-09-28 ENCOUNTER — Other Ambulatory Visit: Payer: Self-pay

## 2018-09-28 NOTE — Progress Notes (Signed)
Remote pacemaker transmission.   

## 2018-10-06 ENCOUNTER — Other Ambulatory Visit: Payer: Self-pay | Admitting: Cardiology

## 2018-10-10 ENCOUNTER — Other Ambulatory Visit: Payer: Self-pay | Admitting: Emergency Medicine

## 2018-10-10 MED ORDER — RIVAROXABAN 20 MG PO TABS: 20.0000 mg | ORAL_TABLET | Freq: Every day | ORAL | 1 refills | Status: DC

## 2018-10-10 NOTE — Telephone Encounter (Signed)
Xarelto 20 mg daily refilled. 

## 2018-10-18 ENCOUNTER — Telehealth (INDEPENDENT_AMBULATORY_CARE_PROVIDER_SITE_OTHER): Payer: Medicare Other | Admitting: Cardiology

## 2018-10-18 ENCOUNTER — Other Ambulatory Visit: Payer: Self-pay

## 2018-10-18 ENCOUNTER — Encounter: Payer: Self-pay | Admitting: Cardiology

## 2018-10-18 VITALS — BP 132/78 | HR 66 | Wt 252.0 lb

## 2018-10-18 DIAGNOSIS — I350 Nonrheumatic aortic (valve) stenosis: Secondary | ICD-10-CM | POA: Diagnosis not present

## 2018-10-18 DIAGNOSIS — E785 Hyperlipidemia, unspecified: Secondary | ICD-10-CM

## 2018-10-18 DIAGNOSIS — I5032 Chronic diastolic (congestive) heart failure: Secondary | ICD-10-CM | POA: Diagnosis not present

## 2018-10-18 DIAGNOSIS — Z950 Presence of cardiac pacemaker: Secondary | ICD-10-CM | POA: Diagnosis not present

## 2018-10-18 DIAGNOSIS — Z952 Presence of prosthetic heart valve: Secondary | ICD-10-CM | POA: Diagnosis not present

## 2018-10-18 DIAGNOSIS — IMO0001 Reserved for inherently not codable concepts without codable children: Secondary | ICD-10-CM

## 2018-10-18 NOTE — Patient Instructions (Signed)
Medication Instructions:  Your physician recommends that you continue on your current medications as directed. Please refer to the Current Medication list given to you today.  If you need a refill on your cardiac medications before your next appointment, please call your pharmacy.   Lab work: None.  If you have labs (blood work) drawn today and your tests are completely normal, you will receive your results only by: . MyChart Message (if you have MyChart) OR . A paper copy in the mail If you have any lab test that is abnormal or we need to change your treatment, we will call you to review the results.  Testing/Procedures: None.   Follow-Up: At [facility], you and your health needs are our priority.  As part of our continuing mission to provide you with exceptional heart care, we have created designated Provider Care Teams.  These Care Teams include your primary Cardiologist (physician) and Advanced Practice Providers (APPs -  Physician Assistants and Nurse Practitioners) who all work together to provide you with the care you need, when you need it. You will need a follow up appointment in 4 months.  Please call our office 2 months in advance to schedule this appointment.  You may see Robert Krasowski, MD or another member of our [facility] Provider Team in [place]: Brian Munley, MD . Rajan Revankar, MD  Any Other Special Instructions Will Be Listed Below (If Applicable).     

## 2018-10-18 NOTE — Progress Notes (Signed)
Virtual Visit via Video Note   This visit type was conducted due to national recommendations for restrictions regarding the COVID-19 Pandemic (e.g. social distancing) in an effort to limit this patient's exposure and mitigate transmission in our community.  Due to his co-morbid illnesses, this patient is at least at moderate risk for complications without adequate follow up.  This format is felt to be most appropriate for this patient at this time.  All issues noted in this document were discussed and addressed.  A limited physical exam was performed with this format.  Please refer to the patient's chart for his consent to telehealth for [facility].  Evaluation Performed:  Follow-up visit  This visit type was conducted due to national recommendations for restrictions regarding the COVID-19 Pandemic (e.g. social distancing).  This format is felt to be most appropriate for this patient at this time.  All issues noted in this document were discussed and addressed.  No physical exam was performed (except for noted visual exam findings with Video Visits).  Please refer to the patient's chart (MyChart message for video visits and phone note for telephone visits) for the patient's consent to telehealth for [facility].  Date:  10/18/2018  ID: Frederick Vasquez, DOB [birth year], MRN [id number]   Patient Location: [address] [place] [address]   Provider location:   [place] Office  PCP:  Algis Greenhouse, MD  Cardiologist:  [name], MD     Chief Complaint: Doing well  History of Present Illness:    Frederick Vasquez is a 78 y.o. male  who presents via audio/video conferencing for a telehealth visit today.  With history of significant aortic stenosis status post TAVR which was done in December 2019.  Since that time she is doing much better cardiac wise denies have any chest pain tightness squeezing pressure burning chest.  There is no passing out.  About 2 weeks ago  he ended up going to the emergency room because of high fever.  He was tested for for COVID-19, luckily it came negative.  Overall doing well denies having any cardiac complaints very happy and satisfied the way he feels.   The patient does not have symptoms concerning for COVID-19 infection (fever, chills, cough, or new SHORTNESS OF BREATH).    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   26 mm Edwards Sapien 3 transcatheter heart valve placed via percutaneous right transfemoral approach     Past Surgical History:  Procedure Laterality Date   CARDIAC CATHETERIZATION  2014   non-obs dz, done at [place] N/A 09/02/2017   Procedure: CARDIOVERSION;  Surgeon: Lelon Perla, MD;  Location: [employer];  Service: Cardiovascular;  Laterality: N/A;   COLONOSCOPY     EYE SURGERY     bilateral cataracts   PACEMAKER IMPLANT  03/2012   REPLACEMENT TOTAL KNEE Left    RIGHT/LEFT HEART CATH AND CORONARY ANGIOGRAPHY N/A 03/28/2018   Procedure: RIGHT/LEFT HEART CATH AND CORONARY ANGIOGRAPHY;  Surgeon: Tamala Julian,  Lynnell Dike, MD;  Location: [place] CV LAB;  Service: Cardiovascular;  Laterality: N/A;   TEE WITHOUT CARDIOVERSION N/A 05/12/2018   Procedure: TRANSESOPHAGEAL ECHOCARDIOGRAM (TEE);  Surgeon: Sherren Mocha, MD;  Location: [place];  Service: Open Heart Surgery;  Laterality: N/A;   TRANSCATHETER AORTIC VALVE REPLACEMENT, TRANSFEMORAL N/A 05/12/2018   Procedure: TRANSCATHETER AORTIC VALVE REPLACEMENT, TRANSFEMORAL;  Surgeon: Sherren Mocha, MD;  Location: [place];  Service: Open Heart Surgery;  Laterality: N/A;     VASECTOMY       Current Meds  Medication Sig   acetaminophen (TYLENOL) 500 MG tablet Take 1,000 mg by mouth every 6 (six) hours as needed for moderate pain or headache.   amiodarone (PACERONE) 200 MG tablet TAKE 1 TABLET BY MOUTH EVERY DAY   metoprolol tartrate (LOPRESSOR) 50 MG tablet Take 1 tablet (50 mg total) by mouth 2 (two) times daily.   nitroGLYCERIN (NITROSTAT) 0.4 MG SL tablet Place 0.4 mg under the tongue every 5 (five) minutes as needed for chest pain.   rivaroxaban (XARELTO) 20 MG TABS tablet Take 1 tablet (20 mg total) by mouth daily with supper.   rosuvastatin (CRESTOR) 20 MG tablet Take 1 tablet (20 mg total) by mouth at bedtime.   tamsulosin (FLOMAX) 0.4 MG CAPS capsule Take 1 capsule (0.4 mg total) by mouth daily after supper.      Family History: The patient's family history includes CAD in his father; Colon cancer in his sister; Stroke in his father.   ROS:   Please see the history of present illness.     All other systems reviewed and are negative.   Labs/Other Tests and Data Reviewed:     Recent Labs: 05/10/2018: ALT 51; B Natriuretic Peptide 257.0 05/13/2018: BUN 13; Creatinine, Ser 1.06; Hemoglobin 11.3; Magnesium 1.8; Platelets 124; Potassium 4.1; Sodium 134  Recent Lipid Panel No results found for: CHOL, TRIG, HDL, CHOLHDL, VLDL, LDLCALC, LDLDIRECT    Exam:    Vital Signs:  BP 132/78    Pulse 66    Wt 252 lb (114.3 kg)    BMI 36.16 kg/m     Wt Readings from Last 3 Encounters:  10/18/18 252 lb (114.3 kg)  08/17/18 250 lb 3.2 oz (113.5 kg)  06/21/18 250 lb (113.4 kg)     Well nourished, well developed in no acute distress. Alert awake oriented x3 happy to be able to talk to me via video link.  Not in any distress.  Denies having any swelling.  Diagnosis for this visit:   1. S/P TAVR (transcatheter aortic valve replacement)   2. Presence of cardiac pacemaker   3. Dyslipidemia   4. Normal coronary arteries   5. Chronic  diastolic CHF (congestive heart failure) ([place])      ASSESSMENT & PLAN:    1.  Status post TAVR seems to be working well.  Clinically he is doing very well we will continue present management. 2.  Cardiac pacemaker present recently interrogated normal functioning noted to have some SVT medication has been adjusted since that time he is doing well. 3.  Dyslipidemia fasting lipid profile is acceptable we will continue present management. 4.  Normal coronary arteries by cardiac catheterization noted. 5.  Chronic diastolic congestive heart failure seems to be compensated no swelling of lower extremities.  Doing well  COVID-19 Education: The signs and symptoms of COVID-19 were discussed with the patient and how to seek care for testing (follow up with PCP or arrange E-visit).  The importance of  social distancing was discussed today.  Patient Risk:   After full review of this patients clinical status, I feel that they are at least moderate risk at this time.  Time:   Today, I have spent 19 minutes with the patient with telehealth technology discussing pt health issues.  I spent 5 minutes reviewing her chart before the visit.  Visit was finished at 9:08 AM.    Medication Adjustments/Labs and Tests Ordered: Current medicines are reviewed at length with the patient today.  Concerns regarding medicines are outlined above.  No orders of the defined types were placed in this encounter.  Medication changes: No orders of the defined types were placed in this encounter.    Disposition: Follow-up in 4 months  Signed, Park Liter, MD, [facility] 10/18/2018 9:08 AM    [place]

## 2018-12-19 ENCOUNTER — Ambulatory Visit (INDEPENDENT_AMBULATORY_CARE_PROVIDER_SITE_OTHER): Payer: Medicare Other | Admitting: *Deleted

## 2018-12-19 DIAGNOSIS — I442 Atrioventricular block, complete: Secondary | ICD-10-CM

## 2018-12-19 LAB — CUP PACEART REMOTE DEVICE CHECK
Battery Impedance: 852 Ohm
Battery Remaining Longevity: 61 mo
Battery Voltage: 2.77 V
Brady Statistic AP VP Percent: 75 %
Brady Statistic AP VS Percent: 0 %
Brady Statistic AS VP Percent: 25 %
Brady Statistic AS VS Percent: 0 %
Date Time Interrogation Session: 20200728113246
Implantable Lead Implant Date: 20131113
Implantable Lead Implant Date: 20131113
Implantable Lead Location: 753859
Implantable Lead Location: 753860
Implantable Lead Model: 5076
Implantable Lead Model: 5092
Implantable Pulse Generator Implant Date: 20131113
Lead Channel Impedance Value: 392 Ohm
Lead Channel Impedance Value: 716 Ohm
Lead Channel Pacing Threshold Amplitude: 0.5 V
Lead Channel Pacing Threshold Amplitude: 0.75 V
Lead Channel Pacing Threshold Pulse Width: 0.4 ms
Lead Channel Pacing Threshold Pulse Width: 0.4 ms
Lead Channel Setting Pacing Amplitude: 2 V
Lead Channel Setting Pacing Amplitude: 2.5 V
Lead Channel Setting Pacing Pulse Width: 0.4 ms
Lead Channel Setting Sensing Sensitivity: 4 mV

## 2019-01-01 ENCOUNTER — Encounter: Payer: Self-pay | Admitting: Cardiology

## 2019-01-01 NOTE — Progress Notes (Signed)
Remote pacemaker transmission.   

## 2019-01-25 DIAGNOSIS — R911 Solitary pulmonary nodule: Secondary | ICD-10-CM | POA: Insufficient documentation

## 2019-01-25 DIAGNOSIS — J984 Other disorders of lung: Secondary | ICD-10-CM

## 2019-01-25 HISTORY — DX: Other disorders of lung: J98.4

## 2019-01-25 HISTORY — DX: Solitary pulmonary nodule: R91.1

## 2019-02-17 ENCOUNTER — Inpatient Hospital Stay
Admission: AD | Admit: 2019-02-17 | Payer: Medicare Other | Source: Other Acute Inpatient Hospital | Admitting: Internal Medicine

## 2019-02-17 DIAGNOSIS — I4891 Unspecified atrial fibrillation: Secondary | ICD-10-CM | POA: Diagnosis not present

## 2019-02-17 DIAGNOSIS — I2510 Atherosclerotic heart disease of native coronary artery without angina pectoris: Secondary | ICD-10-CM | POA: Diagnosis not present

## 2019-02-17 DIAGNOSIS — J61 Pneumoconiosis due to asbestos and other mineral fibers: Secondary | ICD-10-CM

## 2019-02-17 DIAGNOSIS — A4151 Sepsis due to Escherichia coli [E. coli]: Secondary | ICD-10-CM | POA: Diagnosis not present

## 2019-02-17 DIAGNOSIS — N390 Urinary tract infection, site not specified: Secondary | ICD-10-CM | POA: Diagnosis not present

## 2019-02-18 DIAGNOSIS — I4891 Unspecified atrial fibrillation: Secondary | ICD-10-CM | POA: Diagnosis not present

## 2019-02-18 DIAGNOSIS — I2510 Atherosclerotic heart disease of native coronary artery without angina pectoris: Secondary | ICD-10-CM | POA: Diagnosis not present

## 2019-02-18 DIAGNOSIS — A4151 Sepsis due to Escherichia coli [E. coli]: Secondary | ICD-10-CM | POA: Diagnosis not present

## 2019-02-18 DIAGNOSIS — N390 Urinary tract infection, site not specified: Secondary | ICD-10-CM | POA: Diagnosis not present

## 2019-02-19 DIAGNOSIS — I4891 Unspecified atrial fibrillation: Secondary | ICD-10-CM | POA: Diagnosis not present

## 2019-02-19 DIAGNOSIS — A4151 Sepsis due to Escherichia coli [E. coli]: Secondary | ICD-10-CM | POA: Diagnosis not present

## 2019-02-19 DIAGNOSIS — I2510 Atherosclerotic heart disease of native coronary artery without angina pectoris: Secondary | ICD-10-CM | POA: Diagnosis not present

## 2019-02-19 DIAGNOSIS — I342 Nonrheumatic mitral (valve) stenosis: Secondary | ICD-10-CM

## 2019-02-19 DIAGNOSIS — N390 Urinary tract infection, site not specified: Secondary | ICD-10-CM | POA: Diagnosis not present

## 2019-02-20 DIAGNOSIS — I2510 Atherosclerotic heart disease of native coronary artery without angina pectoris: Secondary | ICD-10-CM | POA: Diagnosis not present

## 2019-02-20 DIAGNOSIS — A4151 Sepsis due to Escherichia coli [E. coli]: Secondary | ICD-10-CM | POA: Diagnosis not present

## 2019-02-20 DIAGNOSIS — N390 Urinary tract infection, site not specified: Secondary | ICD-10-CM | POA: Diagnosis not present

## 2019-02-20 DIAGNOSIS — I4891 Unspecified atrial fibrillation: Secondary | ICD-10-CM | POA: Diagnosis not present

## 2019-02-21 ENCOUNTER — Telehealth: Payer: Medicare Other | Admitting: Cardiology

## 2019-03-11 ENCOUNTER — Other Ambulatory Visit: Payer: Self-pay | Admitting: Cardiology

## 2019-03-12 NOTE — Telephone Encounter (Signed)
Xarelto refill sent to [facility] on [address] Tia Alert

## 2019-03-19 ENCOUNTER — Encounter: Payer: Self-pay | Admitting: Cardiology

## 2019-03-19 ENCOUNTER — Other Ambulatory Visit: Payer: Self-pay

## 2019-03-19 ENCOUNTER — Ambulatory Visit (INDEPENDENT_AMBULATORY_CARE_PROVIDER_SITE_OTHER): Payer: Medicare Other | Admitting: Cardiology

## 2019-03-19 VITALS — BP 140/80 | HR 81 | Ht 70.0 in | Wt 270.0 lb

## 2019-03-19 DIAGNOSIS — I5032 Chronic diastolic (congestive) heart failure: Secondary | ICD-10-CM | POA: Diagnosis not present

## 2019-03-19 DIAGNOSIS — Z950 Presence of cardiac pacemaker: Secondary | ICD-10-CM

## 2019-03-19 DIAGNOSIS — Z7901 Long term (current) use of anticoagulants: Secondary | ICD-10-CM

## 2019-03-19 DIAGNOSIS — I10 Essential (primary) hypertension: Secondary | ICD-10-CM

## 2019-03-19 DIAGNOSIS — Z952 Presence of prosthetic heart valve: Secondary | ICD-10-CM | POA: Diagnosis not present

## 2019-03-19 NOTE — Patient Instructions (Signed)

## 2019-03-19 NOTE — Progress Notes (Signed)
Cardiology Office Note:    Date:  03/19/2019   ID:  Frederick Vasquez, DOB Sep 06, 1940, MRN [id number]  PCP:  Algis Greenhouse, MD  Cardiologist:  [name], MD    Referring MD: Algis Greenhouse, MD   Chief Complaint  Patient presents with  . Follow-up  Doing well cardiac wise  History of Present Illness:    Frederick Vasquez is a 78 y.o. 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  Few months ago he ended going to [facility] because of urinary tract infection that led to sepsis.  He spent few days in the hospital now recovering still out of the department gradually getting better.  Described to have some sweating and explained.  But otherwise try to walk around move around.  Denies have any chest pain, tightness, pressure, burning in the chest.  Past Medical History:  Diagnosis Date  . Arthritis    bilateral hands and knees  . Atrial fibrillation ([place])    Xarelto  . Diastolic heart failure ([place])   . Dysrhythmia    A- Fib  . Hyperlipidemia   . Hypertension   . Impaired glucose tolerance   . Pacemaker   . 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 CARDIAC CATHETERIZATION  2014   non-obs dz, done at [facility]  . CARDIOVERSION N/A 09/02/2017   Procedure: CARDIOVERSION;  Surgeon: Lelon Perla, MD;  Location: [facility] ENDOSCOPY;  Service: Cardiovascular;  Laterality: N/A;  . COLONOSCOPY    . EYE SURGERY     bilateral cataracts  . PACEMAKER IMPLANT  03/2012  . REPLACEMENT TOTAL KNEE Left   . RIGHT/LEFT HEART CATH AND CORONARY ANGIOGRAPHY N/A 03/28/2018   Procedure: RIGHT/LEFT HEART CATH AND CORONARY  ANGIOGRAPHY;  Surgeon: Belva Crome, MD;  Location: [place] CV LAB;  Service: Cardiovascular;  Laterality: N/A;  . TEE WITHOUT CARDIOVERSION N/A 05/12/2018   Procedure: TRANSESOPHAGEAL ECHOCARDIOGRAM (TEE);  Surgeon: Sherren Mocha, MD;  Location: [place];  Service: Open Heart Surgery;  Laterality: N/A;  . TRANSCATHETER AORTIC VALVE REPLACEMENT, TRANSFEMORAL N/A 05/12/2018   Procedure: TRANSCATHETER AORTIC VALVE REPLACEMENT, TRANSFEMORAL;  Surgeon: Sherren Mocha, MD;  Location: [place];  Service: Open Heart Surgery;  Laterality: N/A;  . VASECTOMY      Current Medications: Current Meds  Medication Sig  . acetaminophen (TYLENOL) 500 MG tablet Take 1,000 mg by mouth every 6 (six) hours as needed for moderate pain or headache.  Marland Kitchen amiodarone (PACERONE) 200 MG tablet TAKE 1 TABLET BY MOUTH EVERY DAY  . metoprolol tartrate (LOPRESSOR) 50 MG tablet Take 1 tablet (50 mg total) by mouth 2 (two) times daily.  . nitroGLYCERIN (NITROSTAT) 0.4 MG SL tablet Place 0.4 mg under the tongue every 5 (five) minutes as needed for chest pain.  . rosuvastatin (CRESTOR) 20 MG tablet Take 1 tablet (20 mg total) by mouth at bedtime.  . tamsulosin (FLOMAX) 0.4 MG CAPS capsule Take 1 capsule (0.4 mg total) by mouth daily after supper.  Alveda Reasons 20 MG TABS tablet TAKE 1 TABLET(20 MG) BY MOUTH DAILY WITH SUPPER     Allergies:   Allopurinol and Atorvastatin   Social History  Socioeconomic History  . Marital status: Married    Spouse name: Not on file  . Number of children: Not on file  . Years of education: Not on file  . Highest education level: Not on file  Occupational History  . Occupation: retired  Scientific laboratory technician  . Financial resource strain: Not on file  . Food insecurity    Worry: Not on file    Inability: Not on file  . Transportation needs    Medical: Not on file    Non-medical: Not on file  Tobacco Use  . Smoking status: Former Smoker    Quit date: 1978    Years since quitting: 42.8  .  Smokeless tobacco: Never Used  Substance and Sexual Activity  . Alcohol use: No    Frequency: Never  . Drug use: No  . Sexual activity: Not on file  Lifestyle  . Physical activity    Days per week: Not on file    Minutes per session: Not on file  . Stress: Not on file  Relationships  . Social Herbalist on phone: Not on file    Gets together: Not on file    Attends religious service: Not on file    Active member of club or organization: Not on file    Attends meetings of clubs or organizations: Not on file    Relationship status: Not on file  Other Topics Concern  . Not on file  Social History Narrative   Pt lives in [place]     Family History: The patient's family history includes CAD in his father; Colon cancer in his sister; Stroke in his father. ROS:   Please see the history of present illness.    All 14 point review of systems negative except as described per history of present illness  EKGs/Labs/Other Studies Reviewed:      Recent Labs: 05/10/2018: ALT 51; B Natriuretic Peptide 257.0 05/13/2018: BUN 13; Creatinine, Ser 1.06; Hemoglobin 11.3; Magnesium 1.8; Platelets 124; Potassium 4.1; Sodium 134  Recent Lipid Panel No results found for: CHOL, TRIG, HDL, CHOLHDL, VLDL, LDLCALC, LDLDIRECT  Physical Exam:    VS:  BP 140/80   Pulse 81   Ht 5\' 10"  (1.778 m)   Wt 270 lb (122.5 kg)   SpO2 95%   BMI 38.74 kg/m     Wt Readings from Last 3 Encounters:  03/19/19 270 lb (122.5 kg)  10/18/18 252 lb (114.3 kg)  08/17/18 250 lb 3.2 oz (113.5 kg)     GEN:  Well nourished, well developed in no acute distress HEENT: Normal NECK: No JVD; No carotid bruits LYMPHATICS: No lymphadenopathy CARDIAC: RRR, no murmurs, no rubs, no gallops RESPIRATORY:  Clear to auscultation without rales, wheezing or rhonchi  ABDOMEN: Soft, non-tender, non-distended MUSCULOSKELETAL:  No edema; No deformity  SKIN: Warm and dry LOWER EXTREMITIES: no swelling NEUROLOGIC:  Alert  and oriented x 3 PSYCHIATRIC:  Normal affect   ASSESSMENT:    1. S/P TAVR (transcatheter aortic valve replacement)   2. Chronic diastolic CHF (congestive heart failure) ([place])   3. Essential hypertension   4. Presence of cardiac pacemaker   5. Chronic anticoagulation    PLAN:    In order of problems listed above:  1. Status post TAVR doing well from that point of view.  I will retrieve echocardiogram from [facility] to look at the valve.  Hemodynamically stable and doing well compensated 2. Chronic diastolic congestive heart failure stable and compensated 3.  Essential hypertension blood pressure well controlled continue present management. 4. Pacemaker present.  We will continue present management tomorrow he got remote interrogation of the device. 5. Chronic anticoagulation which I will continue. 6. Paroxysmal atrial fibrillation none recorded in the device recently.  Only one episode lasting 1 minute.  No mode switches.   Medication Adjustments/Labs and Tests Ordered: Current medicines are reviewed at length with the patient today.  Concerns regarding medicines are outlined above.  No orders of the defined types were placed in this encounter.  Medication changes: No orders of the defined types were placed in this encounter.   Signed, Park Liter, MD, [facility] 03/19/2019 8:29 AM    [place]

## 2019-03-20 ENCOUNTER — Ambulatory Visit (INDEPENDENT_AMBULATORY_CARE_PROVIDER_SITE_OTHER): Payer: Medicare Other | Admitting: *Deleted

## 2019-03-20 DIAGNOSIS — I442 Atrioventricular block, complete: Secondary | ICD-10-CM | POA: Diagnosis not present

## 2019-03-20 DIAGNOSIS — I4819 Other persistent atrial fibrillation: Secondary | ICD-10-CM

## 2019-03-21 LAB — CUP PACEART REMOTE DEVICE CHECK
Battery Impedance: 853 Ohm
Battery Remaining Longevity: 60 mo
Battery Voltage: 2.77 V
Brady Statistic AP VP Percent: 75 %
Brady Statistic AP VS Percent: 0 %
Brady Statistic AS VP Percent: 25 %
Brady Statistic AS VS Percent: 0 %
Date Time Interrogation Session: 20201027121222
Implantable Lead Implant Date: 20131113
Implantable Lead Implant Date: 20131113
Implantable Lead Location: 753859
Implantable Lead Location: 753860
Implantable Lead Model: 5076
Implantable Lead Model: 5092
Implantable Pulse Generator Implant Date: 20131113
Lead Channel Impedance Value: 359 Ohm
Lead Channel Impedance Value: 631 Ohm
Lead Channel Pacing Threshold Amplitude: 0.5 V
Lead Channel Pacing Threshold Amplitude: 0.75 V
Lead Channel Pacing Threshold Pulse Width: 0.4 ms
Lead Channel Pacing Threshold Pulse Width: 0.4 ms
Lead Channel Setting Pacing Amplitude: 2 V
Lead Channel Setting Pacing Amplitude: 2.5 V
Lead Channel Setting Pacing Pulse Width: 0.4 ms
Lead Channel Setting Sensing Sensitivity: 4 mV

## 2019-03-28 ENCOUNTER — Other Ambulatory Visit: Payer: Self-pay | Admitting: Cardiology

## 2019-04-11 NOTE — Progress Notes (Signed)
Remote pacemaker transmission.   

## 2019-04-19 ENCOUNTER — Other Ambulatory Visit: Payer: Self-pay | Admitting: Cardiology

## 2019-04-23 NOTE — Telephone Encounter (Signed)
Amiodarone refill sent to [facility] in [place].

## 2019-06-14 ENCOUNTER — Other Ambulatory Visit: Payer: Self-pay | Admitting: Cardiology

## 2019-06-19 ENCOUNTER — Ambulatory Visit (INDEPENDENT_AMBULATORY_CARE_PROVIDER_SITE_OTHER): Payer: Medicare Other | Admitting: *Deleted

## 2019-06-19 DIAGNOSIS — I442 Atrioventricular block, complete: Secondary | ICD-10-CM

## 2019-06-20 ENCOUNTER — Ambulatory Visit: Payer: Medicare Other | Admitting: Physician Assistant

## 2019-06-20 ENCOUNTER — Ambulatory Visit (HOSPITAL_COMMUNITY): Payer: Medicare Other | Attending: Cardiovascular Disease

## 2019-06-20 ENCOUNTER — Encounter: Payer: Self-pay | Admitting: Physician Assistant

## 2019-06-20 ENCOUNTER — Other Ambulatory Visit: Payer: Self-pay

## 2019-06-20 ENCOUNTER — Ambulatory Visit (INDEPENDENT_AMBULATORY_CARE_PROVIDER_SITE_OTHER): Payer: Medicare Other | Admitting: Physician Assistant

## 2019-06-20 VITALS — BP 130/66 | HR 84 | Ht 70.0 in | Wt 260.0 lb

## 2019-06-20 DIAGNOSIS — Z952 Presence of prosthetic heart valve: Secondary | ICD-10-CM | POA: Diagnosis present

## 2019-06-20 LAB — CUP PACEART REMOTE DEVICE CHECK
Battery Impedance: 956 Ohm
Battery Remaining Longevity: 57 mo
Battery Voltage: 2.77 V
Brady Statistic AP VP Percent: 75 %
Brady Statistic AP VS Percent: 0 %
Brady Statistic AS VP Percent: 25 %
Brady Statistic AS VS Percent: 0 %
Date Time Interrogation Session: 20210126081100
Implantable Lead Implant Date: 20131113
Implantable Lead Implant Date: 20131113
Implantable Lead Location: 753859
Implantable Lead Location: 753860
Implantable Lead Model: 5076
Implantable Lead Model: 5092
Implantable Pulse Generator Implant Date: 20131113
Lead Channel Impedance Value: 387 Ohm
Lead Channel Impedance Value: 691 Ohm
Lead Channel Pacing Threshold Amplitude: 0.5 V
Lead Channel Pacing Threshold Amplitude: 0.75 V
Lead Channel Pacing Threshold Pulse Width: 0.4 ms
Lead Channel Pacing Threshold Pulse Width: 0.4 ms
Lead Channel Setting Pacing Amplitude: 2 V
Lead Channel Setting Pacing Amplitude: 2.5 V
Lead Channel Setting Pacing Pulse Width: 0.4 ms
Lead Channel Setting Sensing Sensitivity: 4 mV

## 2019-06-20 NOTE — Patient Instructions (Signed)
Medication Instructions:  No changes *If you need a refill on your cardiac medications before your next appointment, please call your pharmacy*  Lab Work: none If you have labs (blood work) drawn today and your tests are completely normal, you will receive your results only by: Marland Kitchen MyChart Message (if you have MyChart) OR . A paper copy in the mail If you have any lab test that is abnormal or we need to change your treatment, we will call you to review the results.  Testing/Procedures: none  Follow-Up: As scheduled with Dr. Rondel Oh as planned.  Other Instructions

## 2019-06-20 NOTE — Progress Notes (Signed)
[facility] [place]                                       Cardiology Office Note    Date:  06/20/2019   ID:  Frederick Vasquez, DOB [birth year], MRN [id number]  PCP:  Algis Greenhouse, MD  Cardiologist:  Dr. Agustin Cree / Dr. Burt Knack & Dr. Roxy Manns (TAVR) EP: Dr. Curt Bears  CC: 1 year s/p TAVR   History of Present Illness:  Frederick Vasquez is a 79 y.o. 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   He underwent successful TAVR with a 26 mm Edwards Sapian 3 valve. Postoperative echo showed normal systolic function with EF of 55-60% and stable AV gradients. He was discharged on aspirin and Xarelto. 1 month echo showed EF 60% with normally functioning TAVR with mean gradient of 14 mmHg  Today he presents to clinic for follow up. No CP. He gets short of breath with moderate exertion such as pulling a trash can up an incline. Staying very active doing yard work. Sometimes has to take breaks because of shortness of breath and fatigue. Basically able to do what he wants. He can tell a big improvement since valve surgery. Trying to loose weight. He gets mild LE edema. No orthopnea or PND. No dizziness or syncope. No blood in stool or urine. No palpitations. He was admitted to [facility] about a month ago for what sounds like urosepsis (unable to see records).     Past Medical History:  Diagnosis Date  . Arthritis    bilateral hands and knees  . Atrial fibrillation ([place])    Xarelto  . Diastolic heart failure ([place])   . Dysrhythmia    A- Fib  . Hyperlipidemia   . Hypertension   . Impaired glucose tolerance   . Pacemaker   . 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 CARDIAC CATHETERIZATION  2014   non-obs dz, done at [facility]  . CARDIOVERSION N/A 09/02/2017   Procedure: CARDIOVERSION;  Surgeon: Lelon Perla, MD;  Location: [facility] ENDOSCOPY;  Service: Cardiovascular;  Laterality: N/A;  . COLONOSCOPY    . EYE SURGERY     bilateral cataracts  . PACEMAKER IMPLANT  03/2012  . REPLACEMENT TOTAL KNEE Left   . RIGHT/LEFT HEART CATH AND CORONARY ANGIOGRAPHY N/A 03/28/2018   Procedure: RIGHT/LEFT HEART CATH AND CORONARY ANGIOGRAPHY;  Surgeon: Belva Crome, MD;  Location: [place] CV LAB;  Service: Cardiovascular;  Laterality: N/A;  . TEE WITHOUT CARDIOVERSION N/A 05/12/2018   Procedure: TRANSESOPHAGEAL ECHOCARDIOGRAM (TEE);  Surgeon: Sherren Mocha, MD;  Location: [place];  Service: Open Heart Surgery;  Laterality: N/A;  . TRANSCATHETER AORTIC VALVE REPLACEMENT, TRANSFEMORAL N/A 05/12/2018   Procedure: TRANSCATHETER AORTIC VALVE REPLACEMENT, TRANSFEMORAL;  Surgeon: Sherren Mocha, MD;  Location: [place];  Service: Open Heart Surgery;  Laterality: N/A;  . VASECTOMY      Current Medications: Outpatient Medications Prior to Visit  Medication Sig Dispense Refill  . acetaminophen (TYLENOL) 500 MG tablet Take 1,000 mg by mouth every  6 (six) hours as needed for moderate pain or headache.    Marland Kitchen amiodarone (PACERONE) 200 MG tablet TAKE 1 TABLET(200 MG) BY MOUTH DAILY 90 tablet 1  . metoprolol tartrate (LOPRESSOR) 50 MG tablet TAKE 1 TABLET(50 MG) BY MOUTH TWICE DAILY 180 tablet 1  . nitroGLYCERIN (NITROSTAT) 0.4 MG SL tablet Place 0.4 mg under the tongue every 5 (five) minutes as needed for chest pain.    . rosuvastatin (CRESTOR) 20 MG tablet TAKE 1 TABLET(20 MG) BY MOUTH AT BEDTIME 90 tablet 1  . tamsulosin (FLOMAX) 0.4 MG CAPS capsule Take 1 capsule (0.4 mg total) by mouth daily after supper. 30 capsule 0  . XARELTO 20 MG TABS tablet TAKE 1 TABLET(20 MG) BY MOUTH DAILY WITH SUPPER 90 tablet 1   No  facility-administered medications prior to visit.     Allergies:   Allopurinol and Atorvastatin   Social History   Socioeconomic History  . Marital status: Married    Spouse name: Not on file  . Number of children: Not on file  . Years of education: Not on file  . Highest education level: Not on file  Occupational History  . Occupation: retired  Tobacco Use  . Smoking status: Former Smoker    Quit date: 1978    Years since quitting: 43.1  . Smokeless tobacco: Never Used  Substance and Sexual Activity  . Alcohol use: No  . Drug use: No  . Sexual activity: Not on file  Other Topics Concern  . Not on file  Social History Narrative   Pt lives in [place] Determinants of Health   Financial Resource Strain:   . Difficulty of Paying Living Expenses: Not on file  Food Insecurity:   . Worried About Charity fundraiser in the Last Year: Not on file  . Ran Out of Food in the Last Year: Not on file  Transportation Needs:   . Lack of Transportation (Medical): Not on file  . Lack of Transportation (Non-Medical): Not on file  Physical Activity:   . Days of Exercise per Week: Not on file  . Minutes of Exercise per Session: Not on file  Stress:   . Feeling of Stress : Not on file  Social Connections:   . Frequency of Communication with Friends and Family: Not on file  . Frequency of Social Gatherings with Friends and Family: Not on file  . Attends Religious Services: Not on file  . Active Member of Clubs or Organizations: Not on file  . Attends Archivist Meetings: Not on file  . Marital Status: Not on file     Family History:  The patient's family history includes CAD in his father; Colon cancer in his sister; Stroke in his father.      ROS:   Please see the history of present illness.    ROS All other systems reviewed and are negative.   PHYSICAL EXAM:   VS:  BP 130/66   Pulse 84   Ht 5\' 10"  (1.778 m)   Wt 260 lb (117.9 kg)   SpO2 91%   BMI 37.31  kg/m    GEN: Well nourished, well developed, in no acute distress, obese HEENT: normal Neck: no JVD or masses Cardiac: RRR; soft flow murmur. No rubs, or gallops. Trace LE edema.  Respiratory:  clear to auscultation bilaterally, normal work of breathing GI: soft, nontender, nondistended, + BS MS: no deformity or atrophy Skin: warm and dry, no rash Neuro:  Alert and Oriented x 3, Strength and sensation are intact Psych: euthymic mood, full affect   Wt Readings from Last 3 Encounters:  06/20/19 260 lb (117.9 kg)  03/19/19 270 lb (122.5 kg)  10/18/18 252 lb (114.3 kg)      Studies/Labs Reviewed:   EKG:  EKG is ordered today.  This shows an AV paced rhythm HR 84bpm  Recent Labs: No results found for requested labs within last 8760 hours.   Lipid Panel No results found for: CHOL, TRIG, HDL, CHOLHDL, VLDL, LDLCALC, LDLDIRECT  Additional studies/ records that were reviewed today include:   TAVR OPERATIVE NOTE  Date of Procedure:                05/12/2018  Preoperative Diagnosis:      Severe Aortic Stenosis   Postoperative Diagnosis:    Same   Procedure:        Transcatheter Aortic Valve Replacement - Percutaneous  Transfemoral Approach             Edwards Sapien 3 THV (size 26 mm, model # 9600TFX, serial [id number])              Co-Surgeons:                        Valentina Gu. Roxy Manns, MD and Sherren Mocha, MD  Anesthesiologist:                  Laurie Panda, MD  Echocardiographer:              Sanda Klein, MD  Pre-operative Echo Findings: ? severe aortic stenosis ? Normal left ventricular systolic function  Post-operative Echo Findings: ? No paravalvular leak ? Normal/unchanged left ventricular systolic function   ____________   Echocardiogram 05/13/18 Study Conclusions - Left ventricle: The cavity size was normal. Wall thickness was increased in a pattern of moderate LVH. Systolic function was normal. The estimated ejection fraction was in  the range of 55% to 60%. The study is not technically sufficient to allow evaluation of LV diastolic function. - Aortic valve: Post TAVR with 26 mm Sapien 3 no significant PVL stable gradients since implant. Valve area (VTI): 3.56 cm^2. Valve area (Vmax): 3.18 cm^2. Valve area (Vmean): 3.49 cm^2. - Mitral valve: Severely calcified annulus. Moderately thickened, moderately calcified leaflets . Valve area by continuity equation (using LVOT flow): 2.54 cm^2. - Left atrium: The atrium was severely dilated. - Atrial septum: No defect or patent foramen ovale was identified   ______________  Echo 06/21/18 IMPRESSIONS  1. The left ventricle appears to be normal in size, have moderate wall thickness, with normal systolic function of 0000000. Echo evidence of pseudonormal in diastolic filling patterns.  2. A 26 mm an Edwards Edwards Sapien bioprosthetic aortic valve valve is present in the aortic position. The prosthesis was placed on 05/12/18. Mean gradient is 14 mmHg.  3. Echo shows normal structure and function of the aortic prosthesis.  4. Aortic valve tricuspid.  5. Aortic valve regurgitation is mild by color flow Doppler.  6. Right ventricular systolic pressure is normal.  7. The right ventricle is normal in size, has normal wall thickness and normal systolic function.  8. Severely dilated left atrial size.  9. Mildly dilated right atrial size. 10. Severe mitral annular calcification. 11. The mitral valve normal in structure and function. 12. There is moderate thickening of the mitral valve. 13. There is moderately calcified of the mitral valve. 14. Normal tricuspid  valve. 15. Pulmonic valve regurgitation is mild by color flow Doppler. 16. Mild dilatation of the ascending aorta. 17. No atrial level shunt detected by color flow Doppler.  ___________________   Echo 06/10/19 IMPRESSIONS  1. Left ventricular ejection fraction, by visual estimation, is 60 to 65%. The left  ventricle has normal function. There is mildly increased left ventricular hypertrophy.  2. Left ventricular diastolic parameters are indeterminate.  3. The left ventricle has no regional wall motion abnormalities.  4. Global right ventricle has normal systolic function.The right ventricular size is normal. No increase in right ventricular wall thickness.  5. Left atrial size was moderately dilated.  6. Right atrial size was normal.  7. Severe calcification of the mitral valve leaflet(s).  8. Severe mitral annular calcification.  9. Severe thickening of the mitral valve leaflet(s). 10. The mitral valve is degenerative. Trivial mitral valve regurgitation. 11. Small diastolic gradient across MV mean 4 mmHg MVA 2.04 cm2. 12. The tricuspid valve is normal in structure. 13. The tricuspid valve is normal in structure. Tricuspid valve regurgitation is mild. 14. Aortic valve regurgitation is not visualized. 15. Post TAVR 26 mm Sapien 3 Well positioned with identical mean gradient 14 mmhg compared to echo 06/21/18 no significant PVL ( note diastolic inflow from mitral valve can be confusing ). 16. Pulmonic regurgitation is mild. 17. The pulmonic valve was grossly normal. Pulmonic valve regurgitation is mild. 18. Mildly elevated pulmonary artery systolic pressure.   ASSESSMENT & PLAN:   Severe AS s/p TAVR: echo today shows EF 60% with normally functioning TAVR with mean gradient of 14 mmHg. He has NYHA class II symptoms of shortness of breath and fatigue but symptoms are much improved from previous. SBE prophylaxis discussed; the patient is edentulous and does not go to the dentist. He will continue on Xarelto alone for afib. Continue regular follow up with Dr. Agustin Cree.   Medication Adjustments/Labs and Tests Ordered: Current medicines are reviewed at length with the patient today.  Concerns regarding medicines are outlined above.  Medication changes, Labs and Tests ordered today are listed in the  Patient Instructions below. There are no Patient Instructions on file for this visit.   Signed, Angelena Form, PA-C  06/20/2019 3:30 PM    [facility] [place], [place], [place]  [address] Phone: [phone]; Fax: [phone]

## 2019-09-10 ENCOUNTER — Other Ambulatory Visit: Payer: Self-pay

## 2019-09-10 ENCOUNTER — Ambulatory Visit (INDEPENDENT_AMBULATORY_CARE_PROVIDER_SITE_OTHER): Payer: Medicare Other | Admitting: Cardiology

## 2019-09-10 ENCOUNTER — Encounter: Payer: Self-pay | Admitting: Cardiology

## 2019-09-10 VITALS — BP 142/76 | HR 83 | Ht 70.0 in | Wt 273.2 lb

## 2019-09-10 DIAGNOSIS — I442 Atrioventricular block, complete: Secondary | ICD-10-CM

## 2019-09-10 DIAGNOSIS — Z79899 Other long term (current) drug therapy: Secondary | ICD-10-CM

## 2019-09-10 DIAGNOSIS — Z950 Presence of cardiac pacemaker: Secondary | ICD-10-CM | POA: Diagnosis not present

## 2019-09-10 DIAGNOSIS — I4819 Other persistent atrial fibrillation: Secondary | ICD-10-CM | POA: Diagnosis not present

## 2019-09-10 LAB — CUP PACEART INCLINIC DEVICE CHECK
Battery Impedance: 1061 Ohm
Battery Remaining Longevity: 53 mo
Battery Voltage: 2.77 V
Brady Statistic AP VP Percent: 75 %
Brady Statistic AP VS Percent: 0 %
Brady Statistic AS VP Percent: 25 %
Brady Statistic AS VS Percent: 0 %
Date Time Interrogation Session: 20210419120600
Implantable Lead Implant Date: 20131113
Implantable Lead Implant Date: 20131113
Implantable Lead Location: 753859
Implantable Lead Location: 753860
Implantable Lead Model: 5076
Implantable Lead Model: 5092
Implantable Pulse Generator Implant Date: 20131113
Lead Channel Impedance Value: 388 Ohm
Lead Channel Impedance Value: 675 Ohm
Lead Channel Pacing Threshold Amplitude: 0.5 V
Lead Channel Pacing Threshold Amplitude: 0.75 V
Lead Channel Pacing Threshold Pulse Width: 0.4 ms
Lead Channel Pacing Threshold Pulse Width: 0.4 ms
Lead Channel Sensing Intrinsic Amplitude: 2.8 mV
Lead Channel Setting Pacing Amplitude: 2 V
Lead Channel Setting Pacing Amplitude: 2.5 V
Lead Channel Setting Pacing Pulse Width: 0.4 ms
Lead Channel Setting Sensing Sensitivity: 4 mV

## 2019-09-10 NOTE — Patient Instructions (Signed)
Medication Instructions:  Your physician recommends that you continue on your current medications as directed. Please refer to the Current Medication list given to you today.  *If you need a refill on your cardiac medications before your next appointment, please call your pharmacy*   Lab Work: Today: CMET, CBC & TSH If you have labs (blood work) drawn today and your tests are completely normal, you will receive your results only by: Marland Kitchen MyChart Message (if you have MyChart) OR . A paper copy in the mail If you have any lab test that is abnormal or we need to change your treatment, we will call you to review the results.   Testing/Procedures: None ordered   Follow-Up: At [facility], you and your health needs are our priority.  As part of our continuing mission to provide you with exceptional heart care, we have created designated Provider Care Teams.  These Care Teams include your primary Cardiologist (physician) and Advanced Practice Providers (APPs -  Physician Assistants and Nurse Practitioners) who all work together to provide you with the care you need, when you need it.  We recommend signing up for the patient portal called "MyChart".  Sign up information is provided on this After Visit Summary.  MyChart is used to connect with patients for Virtual Visits (Telemedicine).  Patients are able to view lab/test results, encounter notes, upcoming appointments, etc.  Non-urgent messages can be sent to your provider as well.   To learn more about what you can do with MyChart, go to [email or web address].    Your next appointment:   1 year(s)  The format for your next appointment:   In Person  Provider:   Allegra Lai, MD   Thank you for choosing [facility]!!   Trinidad Curet, RN [phone]    Other Instructions

## 2019-09-10 NOTE — Progress Notes (Signed)
Electrophysiology Office Note   Date:  09/10/2019   ID:  Frederick Vasquez, DOB [birth year], MRN [id number]  PCP:  Algis Greenhouse, MD  Cardiologist:  Agustin Cree Primary Electrophysiologist:  Luane Rochon Meredith Leeds, MD    No chief complaint on file.    History of Present Illness: Frederick Vasquez is a 79 y.o. male who is being seen today for the evaluation of atrial fibrillation, pacemaker at the request of [name]. Presenting today for electrophysiology evaluation.  He has a history of paroxysmal atrial fibrillation, diastolic heart failure, hypertension, hyperlipidemia.  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  Today, denies symptoms of palpitations, chest pain, shortness of breath, orthopnea, PND, lower extremity edema, claudication, dizziness, presyncope, syncope, bleeding, or neurologic sequela. The patient is tolerating medications without difficulties.  Overall he is doing well.  He has no chest pain or shortness of breath.  He is able do all of his daily activities without restriction.   Past Medical History:  Diagnosis Date  . Arthritis    bilateral hands and knees  . Atrial fibrillation ([place])    Xarelto  . Diastolic heart failure ([place])   . Dysrhythmia    A- Fib  . Hyperlipidemia   . Hypertension   . Impaired glucose tolerance   . Pacemaker   . 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 CARDIAC CATHETERIZATION  2014   non-obs dz, done at [facility]  . CARDIOVERSION N/A 09/02/2017   Procedure: CARDIOVERSION;  Surgeon: Lelon Perla, MD;  Location: [facility] ENDOSCOPY;  Service: Cardiovascular;  Laterality: N/A;  . COLONOSCOPY    . EYE SURGERY     bilateral cataracts  . PACEMAKER IMPLANT  03/2012  . REPLACEMENT TOTAL KNEE Left   . RIGHT/LEFT HEART CATH AND CORONARY  ANGIOGRAPHY N/A 03/28/2018   Procedure: RIGHT/LEFT HEART CATH AND CORONARY ANGIOGRAPHY;  Surgeon: Belva Crome, MD;  Location: [place] CV LAB;  Service: Cardiovascular;  Laterality: N/A;  . TEE WITHOUT CARDIOVERSION N/A 05/12/2018   Procedure: TRANSESOPHAGEAL ECHOCARDIOGRAM (TEE);  Surgeon: Sherren Mocha, MD;  Location: [place];  Service: Open Heart Surgery;  Laterality: N/A;  . TRANSCATHETER AORTIC VALVE REPLACEMENT, TRANSFEMORAL N/A 05/12/2018   Procedure: TRANSCATHETER AORTIC VALVE REPLACEMENT, TRANSFEMORAL;  Surgeon: Sherren Mocha, MD;  Location: [place];  Service: Open Heart Surgery;  Laterality: N/A;  . VASECTOMY       Current Outpatient Medications  Medication Sig Dispense Refill  . acetaminophen (TYLENOL) 500 MG tablet Take 1,000 mg by mouth every 6 (six) hours as needed for moderate pain or headache.    Marland Kitchen amiodarone (PACERONE) 200 MG tablet TAKE 1 TABLET(200 MG) BY MOUTH DAILY 90 tablet 1  . metoprolol tartrate (LOPRESSOR) 50 MG tablet TAKE 1 TABLET(50 MG) BY MOUTH TWICE DAILY 180 tablet 1  . nitroGLYCERIN (NITROSTAT) 0.4 MG SL tablet Place 0.4 mg under the tongue every 5 (five) minutes as needed for chest pain.    . rosuvastatin (CRESTOR) 20 MG tablet TAKE 1 TABLET(20 MG) BY MOUTH AT BEDTIME 90 tablet 1  . tamsulosin (FLOMAX) 0.4 MG CAPS capsule Take 1 capsule (0.4 mg total) by mouth daily after supper. 30 capsule 0  . XARELTO 20 MG TABS tablet TAKE 1 TABLET(20 MG) BY MOUTH DAILY WITH SUPPER 90 tablet 1   No current facility-administered medications  for this visit.    Allergies:   Allopurinol and Atorvastatin   Social History:  The patient  reports that he quit smoking about 43 years ago. He has never used smokeless tobacco. He reports that he does not drink alcohol or use drugs.   Family History:  The patient's family history includes CAD in his father; Colon cancer in his sister; Stroke in his father.    ROS:  Please see the history of present illness.   Otherwise,  review of systems is positive for none.   All other systems are reviewed and negative.   PHYSICAL EXAM: VS:  BP (!) 142/76   Pulse 83   Ht _0  (1.778 m)   Wt 273 lb 3.2 oz (123.9 kg)   SpO2 94%   BMI 39.20 kg/m  , BMI Body mass index is 39.2 kg/m. GEN: Well nourished, well developed, in no acute distress  HEENT: normal  Neck: no JVD, carotid bruits, or masses Cardiac: RRR; no murmurs, rubs, or gallops,no edema  Respiratory:  clear to auscultation bilaterally, normal work of breathing GI: soft, nontender, nondistended, + BS MS: no deformity or atrophy  Skin: warm and dry, device site well healed Neuro:  Strength and sensation are intact Psych: euthymic mood, full affect  EKG:  EKG is ordered today. Personal review of the ekg ordered shows AV paced  Personal review of the device interrogation today. Results in [place]: No results found for requested labs within last 8760 hours.    Lipid Panel  No results found for: CHOL, TRIG, HDL, CHOLHDL, VLDL, LDLCALC, LDLDIRECT   Wt Readings from Last 3 Encounters:  09/10/19 273 lb 3.2 oz (123.9 kg)  06/20/19 260 lb (117.9 kg)  03/19/19 270 lb (122.5 kg)      Other studies Reviewed: Additional studies/ records that were reviewed today include: TTE 11/25/17  Review of the above records today demonstrates:  - Left ventricle: The cavity size was normal. There was moderate   concentric hypertrophy. Systolic function was normal. The   estimated ejection fraction was in the range of 55% to 65%. Wall   motion was normal; there were no regional wall motion   abnormalities. Features are consistent with a pseudonormal left   ventricular filling pattern, with concomitant abnormal relaxation   and increased filling pressure (grade 2 diastolic dysfunction).   Doppler parameters are consistent with high ventricular filling   pressure. - Aortic valve: Trileaflet; severely thickened, moderately   calcified leaflets. Valve  mobility was severely restricted. There   was moderate to severe stenosis. There was mild regurgitation.   Peak velocity (S): 403 cm/s. Mean velocity (S): 284 cm/s. Mean   gradient (S): 37 mm Hg. Peak gradient (S): 65 mm Hg. VTI ratio of   LVOT to aortic valve: 0.32. Valve area (VTI): 1.12 cm^2. Valve   area (Vmax): 1.15 cm^2. Valve area (Vmean): 1.08 cm^2. 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 - Mitral valve: Moderately to severely calcified, moderately   thickened annulus. The findings are consistent with mild   stenosis. There was mild regurgitation. Valve area by continuity   equation (using LVOT flow): 2.03 cm^2. - Left atrium: The atrium was severely dilated. - Pulmonary arteries: PA peak pressure: 40 mm Hg (S).   ASSESSMENT AND PLAN:  1.  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  Device functioning appropriately.  No changes.  2.  Paroxysmal atrial fibrillation: Currently on Xarelto and amiodarone.  In sinus rhythm today.  CHA2DS2-VASc of 3.  No changes.  We Viveka Wilmeth plan to check amiodarone labs today.  3.  Moderate aortic stenosis: Followed by primary cardiology  4.  Hyperlipidemia: Followed by internal medicine    Current medicines are reviewed at length with the patient today.   The patient does not have concerns regarding his medicines.  The following changes were made today: None  Labs/ tests ordered today include:  Orders Placed This Encounter  Procedures  . Comp Met (CMET)  . CBC  . TSH  . EKG 12-Lead     Disposition:   FU with Hartman Minahan 1 year  Signed, Jaidin Richison Meredith Leeds, MD  09/10/2019 11:49 AM     [facility] [address] [place] [address] [phone] (office) [phone] (fax)

## 2019-09-11 ENCOUNTER — Other Ambulatory Visit: Payer: Self-pay | Admitting: Cardiology

## 2019-09-11 LAB — COMPREHENSIVE METABOLIC PANEL
ALT: 38 IU/L (ref 0–44)
AST: 50 IU/L — ABNORMAL HIGH (ref 0–40)
Albumin/Globulin Ratio: 1.3 (ref 1.2–2.2)
Albumin: 4.1 g/dL (ref 3.7–4.7)
Alkaline Phosphatase: 66 IU/L (ref 39–117)
BUN/Creatinine Ratio: 16 (ref 10–24)
BUN: 15 mg/dL (ref 8–27)
Bilirubin Total: 0.4 mg/dL (ref 0.0–1.2)
CO2: 23 mmol/L (ref 20–29)
Calcium: 9 mg/dL (ref 8.6–10.2)
Chloride: 102 mmol/L (ref 96–106)
Creatinine, Ser: 0.96 mg/dL (ref 0.76–1.27)
GFR calc Af Amer: 87 mL/min/{1.73_m2} (ref >59)
GFR calc non Af Amer: 75 mL/min/{1.73_m2} (ref >59)
Globulin, Total: 3.2 g/dL (ref 1.5–4.5)
Glucose: 115 mg/dL — ABNORMAL HIGH (ref 65–99)
Potassium: 4.3 mmol/L (ref 3.5–5.2)
Sodium: 137 mmol/L (ref 134–144)
Total Protein: 7.3 g/dL (ref 6.0–8.5)

## 2019-09-11 LAB — CBC
Hematocrit: 38.4 % (ref 37.5–51.0)
Hemoglobin: 13.2 g/dL (ref 13.0–17.7)
MCH: 33.6 pg — ABNORMAL HIGH (ref 26.6–33.0)
MCHC: 34.4 g/dL (ref 31.5–35.7)
MCV: 98 fL — ABNORMAL HIGH (ref 79–97)
Platelets: 94 10*3/uL — CL (ref 150–450)
RBC: 3.93 x10E6/uL — ABNORMAL LOW (ref 4.14–5.80)
RDW: 12.9 % (ref 11.6–15.4)
WBC: 4.6 10*3/uL (ref 3.4–10.8)

## 2019-09-11 LAB — TSH: TSH: 41.7 u[IU]/mL — ABNORMAL HIGH (ref 0.450–4.500)

## 2019-09-11 NOTE — Telephone Encounter (Signed)
LOV with Dr. Curt Bears 09/10/2019 and LOV with Dr. Agustin Cree was 03/19/2019

## 2019-09-13 DIAGNOSIS — E039 Hypothyroidism, unspecified: Secondary | ICD-10-CM | POA: Insufficient documentation

## 2019-09-13 HISTORY — DX: Hypothyroidism, unspecified: E03.9

## 2019-09-25 DIAGNOSIS — Z20822 Contact with and (suspected) exposure to covid-19: Secondary | ICD-10-CM

## 2019-09-25 DIAGNOSIS — K922 Gastrointestinal hemorrhage, unspecified: Secondary | ICD-10-CM | POA: Insufficient documentation

## 2019-09-25 DIAGNOSIS — R197 Diarrhea, unspecified: Secondary | ICD-10-CM

## 2019-09-25 HISTORY — DX: Diarrhea, unspecified: R19.7

## 2019-09-25 HISTORY — DX: Gastrointestinal hemorrhage, unspecified: K92.2

## 2019-09-25 HISTORY — DX: Contact with and (suspected) exposure to covid-19: Z20.822

## 2019-09-30 ENCOUNTER — Other Ambulatory Visit: Payer: Self-pay | Admitting: Cardiology

## 2019-10-01 ENCOUNTER — Other Ambulatory Visit: Payer: Self-pay | Admitting: *Deleted

## 2019-10-01 ENCOUNTER — Other Ambulatory Visit: Payer: Self-pay | Admitting: Cardiology

## 2019-10-01 DIAGNOSIS — I4819 Other persistent atrial fibrillation: Secondary | ICD-10-CM

## 2019-10-01 DIAGNOSIS — Z79899 Other long term (current) drug therapy: Secondary | ICD-10-CM

## 2019-10-15 NOTE — Addendum Note (Signed)
Addended by: Stanton Kidney on: 10/15/2019 09:26 AM   Modules accepted: Orders

## 2019-10-21 LAB — AMIODARONE (CORDARONE), S/P
AMIODARONE: 431 ng/mL — ABNORMAL LOW (ref 1000–2500)
DESETHYLAMIODARONE: 297 ng/mL

## 2019-10-30 ENCOUNTER — Other Ambulatory Visit: Payer: Self-pay | Admitting: Cardiology

## 2019-12-10 ENCOUNTER — Other Ambulatory Visit: Payer: Self-pay | Admitting: Cardiology

## 2019-12-10 ENCOUNTER — Ambulatory Visit (INDEPENDENT_AMBULATORY_CARE_PROVIDER_SITE_OTHER): Payer: Medicare Other | Admitting: *Deleted

## 2019-12-10 DIAGNOSIS — I442 Atrioventricular block, complete: Secondary | ICD-10-CM

## 2019-12-11 LAB — CUP PACEART REMOTE DEVICE CHECK
Battery Impedance: 1219 Ohm
Battery Remaining Longevity: 49 mo
Battery Voltage: 2.77 V
Brady Statistic AP VP Percent: 64 %
Brady Statistic AP VS Percent: 0 %
Brady Statistic AS VP Percent: 36 %
Brady Statistic AS VS Percent: 0 %
Date Time Interrogation Session: 20210719071803
Implantable Lead Implant Date: 20131113
Implantable Lead Implant Date: 20131113
Implantable Lead Location: 753859
Implantable Lead Location: 753860
Implantable Lead Model: 5076
Implantable Lead Model: 5092
Implantable Pulse Generator Implant Date: 20131113
Lead Channel Impedance Value: 364 Ohm
Lead Channel Impedance Value: 669 Ohm
Lead Channel Pacing Threshold Amplitude: 0.5 V
Lead Channel Pacing Threshold Amplitude: 0.75 V
Lead Channel Pacing Threshold Pulse Width: 0.4 ms
Lead Channel Pacing Threshold Pulse Width: 0.4 ms
Lead Channel Setting Pacing Amplitude: 2 V
Lead Channel Setting Pacing Amplitude: 2.5 V
Lead Channel Setting Pacing Pulse Width: 0.4 ms
Lead Channel Setting Sensing Sensitivity: 4 mV

## 2019-12-12 NOTE — Progress Notes (Signed)
Remote pacemaker transmission.   

## 2019-12-21 ENCOUNTER — Telehealth: Payer: Self-pay | Admitting: *Deleted

## 2019-12-21 DIAGNOSIS — I4819 Other persistent atrial fibrillation: Secondary | ICD-10-CM

## 2019-12-21 DIAGNOSIS — Z79899 Other long term (current) drug therapy: Secondary | ICD-10-CM

## 2019-12-21 NOTE — Telephone Encounter (Signed)
Pt will stop by the [place] office next week for follow up Amiodarone level lab. Ordered placed Pt agreeable to plan

## 2020-01-02 LAB — AMIODARONE (CORDARONE), S/P
AMIODARONE: 290 ng/mL — ABNORMAL LOW (ref 1000–2500)
DESETHYLAMIODARONE: 172 ng/mL

## 2020-01-26 ENCOUNTER — Other Ambulatory Visit: Payer: Self-pay | Admitting: Cardiology

## 2020-01-29 ENCOUNTER — Other Ambulatory Visit: Payer: Self-pay | Admitting: Cardiology

## 2020-01-31 ENCOUNTER — Other Ambulatory Visit: Payer: Self-pay | Admitting: Cardiology

## 2020-02-11 ENCOUNTER — Telehealth: Payer: Self-pay | Admitting: *Deleted

## 2020-02-11 ENCOUNTER — Other Ambulatory Visit: Payer: Self-pay | Admitting: *Deleted

## 2020-02-11 DIAGNOSIS — Z79899 Other long term (current) drug therapy: Secondary | ICD-10-CM

## 2020-02-11 DIAGNOSIS — I4819 Other persistent atrial fibrillation: Secondary | ICD-10-CM

## 2020-02-11 NOTE — Telephone Encounter (Signed)
-----   Message from Will Meredith Leeds, MD sent at 01/12/2020  1:07 PM EDT ----- Amiodarone level elevated. Continue monitoring.

## 2020-02-11 NOTE — Telephone Encounter (Signed)
Pt will stop by the [place] office this week/next for follow up Amiodarone level. Pt agreeable to plan.

## 2020-02-18 ENCOUNTER — Telehealth: Payer: Self-pay | Admitting: *Deleted

## 2020-02-18 LAB — AMIODARONE (CORDARONE), S/P
AMIODARONE: 165 ng/mL — ABNORMAL LOW (ref 1000–2500)
DESETHYLAMIODARONE: <100 ng/mL

## 2020-02-18 NOTE — Telephone Encounter (Signed)
lmtcb to arrange OV w/ Dr. Curt Vasquez in [place] (recent Amiodarone lab)

## 2020-02-28 ENCOUNTER — Other Ambulatory Visit: Payer: Self-pay | Admitting: Cardiology

## 2020-02-28 DIAGNOSIS — J449 Chronic obstructive pulmonary disease, unspecified: Secondary | ICD-10-CM

## 2020-02-28 DIAGNOSIS — J4489 Other specified chronic obstructive pulmonary disease: Secondary | ICD-10-CM

## 2020-02-28 HISTORY — DX: Other specified chronic obstructive pulmonary disease: J44.89

## 2020-02-28 HISTORY — DX: Chronic obstructive pulmonary disease, unspecified: J44.9

## 2020-03-10 ENCOUNTER — Ambulatory Visit (INDEPENDENT_AMBULATORY_CARE_PROVIDER_SITE_OTHER): Payer: Medicare Other

## 2020-03-10 DIAGNOSIS — I442 Atrioventricular block, complete: Secondary | ICD-10-CM | POA: Diagnosis not present

## 2020-03-11 LAB — CUP PACEART REMOTE DEVICE CHECK
Battery Impedance: 1248 Ohm
Battery Remaining Longevity: 48 mo
Battery Voltage: 2.77 V
Brady Statistic AP VP Percent: 59 %
Brady Statistic AP VS Percent: 0 %
Brady Statistic AS VP Percent: 41 %
Brady Statistic AS VS Percent: 0 %
Date Time Interrogation Session: 20211018110245
Implantable Lead Implant Date: 20131113
Implantable Lead Implant Date: 20131113
Implantable Lead Location: 753859
Implantable Lead Location: 753860
Implantable Lead Model: 5076
Implantable Lead Model: 5092
Implantable Pulse Generator Implant Date: 20131113
Lead Channel Impedance Value: 378 Ohm
Lead Channel Impedance Value: 667 Ohm
Lead Channel Pacing Threshold Amplitude: 0.5 V
Lead Channel Pacing Threshold Amplitude: 0.625 V
Lead Channel Pacing Threshold Pulse Width: 0.4 ms
Lead Channel Pacing Threshold Pulse Width: 0.4 ms
Lead Channel Setting Pacing Amplitude: 2 V
Lead Channel Setting Pacing Amplitude: 2.5 V
Lead Channel Setting Pacing Pulse Width: 0.4 ms
Lead Channel Setting Sensing Sensitivity: 4 mV

## 2020-03-12 NOTE — Telephone Encounter (Signed)
Pt scheduled to follow up with Dr. Curt Bears 11/8 in [place]. Pt is agreeable to plan.

## 2020-03-14 ENCOUNTER — Other Ambulatory Visit: Payer: Self-pay | Admitting: Cardiology

## 2020-03-14 NOTE — Progress Notes (Signed)
Remote pacemaker transmission.   

## 2020-03-25 ENCOUNTER — Other Ambulatory Visit: Payer: Self-pay | Admitting: Cardiology

## 2020-03-25 NOTE — Telephone Encounter (Signed)
Prescription refill request for Xarelto received.  Indication: Atrial Fibrillation Last office visit: 08/2019  [place] Weight:  123.9 kg Age: 79 Scr: 0.96 08/2019 CrCl: 111.14 ml/min  Prescription refilled

## 2020-03-29 ENCOUNTER — Other Ambulatory Visit: Payer: Self-pay | Admitting: Cardiology

## 2020-03-31 ENCOUNTER — Ambulatory Visit (INDEPENDENT_AMBULATORY_CARE_PROVIDER_SITE_OTHER): Payer: Medicare Other | Admitting: Cardiology

## 2020-03-31 ENCOUNTER — Encounter: Payer: Self-pay | Admitting: Cardiology

## 2020-03-31 ENCOUNTER — Other Ambulatory Visit: Payer: Self-pay

## 2020-03-31 ENCOUNTER — Other Ambulatory Visit: Payer: Self-pay | Admitting: Cardiology

## 2020-03-31 VITALS — BP 142/86 | HR 67 | Ht 70.0 in | Wt 275.0 lb

## 2020-03-31 DIAGNOSIS — I480 Paroxysmal atrial fibrillation: Secondary | ICD-10-CM

## 2020-03-31 DIAGNOSIS — I442 Atrioventricular block, complete: Secondary | ICD-10-CM | POA: Diagnosis not present

## 2020-03-31 NOTE — Progress Notes (Signed)
Electrophysiology Office Note   Date:  03/31/2020   ID:  MIHAIL PRETTYMAN, DOB [birth year], MRN [id number]  PCP:  Algis Greenhouse, MD  Cardiologist:  Agustin Cree Primary Electrophysiologist:  Raheel Kunkle Meredith Leeds, MD    No chief complaint on file.    History of Present Illness: Frederick Vasquez is a 79 y.o. male who is being seen today for the evaluation of atrial fibrillation, pacemaker at the request of [name]. Presenting today for electrophysiology evaluation.    He has a history significant for aortic stenosis status post TAVR December 2019, paroxysmal atrial fibrillation, diastolic heart failure, hypertension, and hyperlipidemia.  He is status post Medtronic dual-chamber pacemaker implanted for complete heart block.  Today, denies symptoms of palpitations, chest pain, shortness of breath, orthopnea, PND, lower extremity edema, claudication, dizziness, presyncope, syncope, bleeding, or neurologic sequela. The patient is tolerating medications without difficulties.  Since last being seen he has done well.  His amiodarone has since been stopped due to elevated TSH and LFTs.  He has noted no further episodes of atrial fibrillation since that occurred.   Past Medical History:  Diagnosis Date  . Arthritis    bilateral hands and knees  . Atrial fibrillation ([place])    Xarelto  . Diastolic heart failure ([place])   . Dysrhythmia    A- Fib  . Hyperlipidemia   . Hypertension   . Impaired glucose tolerance   . Pacemaker   . 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 CARDIAC CATHETERIZATION  2014   non-obs dz, done at [facility]  . CARDIOVERSION N/A 09/02/2017   Procedure: CARDIOVERSION;  Surgeon: Lelon Perla, MD;  Location: [facility] ENDOSCOPY;  Service: Cardiovascular;  Laterality: N/A;  . COLONOSCOPY    . EYE  SURGERY     bilateral cataracts  . PACEMAKER IMPLANT  03/2012  . REPLACEMENT TOTAL KNEE Left   . RIGHT/LEFT HEART CATH AND CORONARY ANGIOGRAPHY N/A 03/28/2018   Procedure: RIGHT/LEFT HEART CATH AND CORONARY ANGIOGRAPHY;  Surgeon: Belva Crome, MD;  Location: [place] CV LAB;  Service: Cardiovascular;  Laterality: N/A;  . TEE WITHOUT CARDIOVERSION N/A 05/12/2018   Procedure: TRANSESOPHAGEAL ECHOCARDIOGRAM (TEE);  Surgeon: Sherren Mocha, MD;  Location: [place];  Service: Open Heart Surgery;  Laterality: N/A;  . TRANSCATHETER AORTIC VALVE REPLACEMENT, TRANSFEMORAL N/A 05/12/2018   Procedure: TRANSCATHETER AORTIC VALVE REPLACEMENT, TRANSFEMORAL;  Surgeon: Sherren Mocha, MD;  Location: [place];  Service: Open Heart Surgery;  Laterality: N/A;  . VASECTOMY       Current Outpatient Medications  Medication Sig Dispense Refill  . acetaminophen (TYLENOL) 500 MG tablet Take 1,000 mg by mouth every 6 (six) hours as needed for moderate pain or headache.    . metoprolol tartrate (LOPRESSOR) 50 MG tablet TAKE 1 TABLET(50 MG) BY MOUTH TWICE DAILY 60 tablet 0  . nitroGLYCERIN (NITROSTAT) 0.4 MG SL tablet Place 0.4 mg under the tongue every 5 (five) minutes as needed for chest pain.    . rosuvastatin (CRESTOR) 20 MG tablet TAKE 1 TABLET(20 MG) BY MOUTH AT BEDTIME 90 tablet 0  . XARELTO 20 MG TABS tablet TAKE 1 TABLET(20 MG) BY MOUTH DAILY WITH SUPPER 90 tablet 1   No current facility-administered medications for this visit.    Allergies:   Allopurinol and Atorvastatin   Social History:  The patient  reports that he quit smoking about 43 years ago. He has never used smokeless tobacco. He reports that he does not drink alcohol and does not use drugs.   Family History:  The patient's family history includes CAD in his father; Colon cancer in his sister; Stroke in his father.   ROS:  Please see the history of present illness.   Otherwise, review of systems is positive for none.   All other systems are  reviewed and negative.   PHYSICAL EXAM: VS:  BP (!) 142/86   Pulse 67   Ht 5\' 10"  (1.778 m)   Wt 275 lb (124.7 kg)   SpO2 92%   BMI 39.46 kg/m  , BMI Body mass index is 39.46 kg/m. GEN: Well nourished, well developed, in no acute distress  HEENT: normal  Neck: no JVD, carotid bruits, or masses Cardiac: RRR; no murmurs, rubs, or gallops,no edema  Respiratory:  clear to auscultation bilaterally, normal work of breathing GI: soft, nontender, nondistended, + BS MS: no deformity or atrophy  Skin: warm and dry, device site well healed Neuro:  Strength and sensation are intact Psych: euthymic mood, full affect  EKG:  EKG is ordered today. Personal review of the ekg ordered shows AV paced  Personal review of the device interrogation today. Results in [place]: 09/10/2019: ALT 38; BUN 15; Creatinine, Ser 0.96; Hemoglobin 13.2; Platelets 94; Potassium 4.3; Sodium 137; TSH 41.700    Lipid Panel  No results found for: CHOL, TRIG, HDL, CHOLHDL, VLDL, LDLCALC, LDLDIRECT   Wt Readings from Last 3 Encounters:  03/31/20 275 lb (124.7 kg)  09/10/19 273 lb 3.2 oz (123.9 kg)  06/20/19 260 lb (117.9 kg)      Other studies Reviewed: Additional studies/ records that were reviewed today include: TTE 11/25/17  Review of the above records today demonstrates:  - Left ventricle: The cavity size was normal. There was moderate   concentric hypertrophy. Systolic function was normal. The   estimated ejection fraction was in the range of 55% to 65%. Wall   motion was normal; there were no regional wall motion   abnormalities. Features are consistent with a pseudonormal left   ventricular filling pattern, with concomitant abnormal relaxation   and increased filling pressure (grade 2 diastolic dysfunction).   Doppler parameters are consistent with high ventricular filling   pressure. - Aortic valve: Trileaflet; severely thickened, moderately   calcified leaflets. Valve mobility was  severely restricted. There   was moderate to severe stenosis. There was mild regurgitation.   Peak velocity (S): 403 cm/s. Mean velocity (S): 284 cm/s. Mean   gradient (S): 37 mm Hg. Peak gradient (S): 65 mm Hg. VTI ratio of   LVOT to aortic valve: 0.32. Valve area (VTI): 1.12 cm^2. Valve   area (Vmax): 1.15 cm^2. Valve area (Vmean): 1.08 cm^2. 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 - Mitral valve: Moderately to severely calcified, moderately   thickened annulus. The findings are consistent with mild   stenosis. There was mild regurgitation. Valve area by continuity   equation (using LVOT flow): 2.03 cm^2. - Left atrium: The atrium was severely dilated. - Pulmonary arteries: PA peak pressure: 40 mm Hg (S).   ASSESSMENT AND PLAN:  1.  Complete heart block: Status post Medtronic dual-chamber pacemaker.  Device functioning appropriately.  No changes at this time.    2.  Paroxysmal atrial fibrillation: Currently on Xarelto.  CHA2DS2-VASc of 3.  Amiodarone has since been stopped  due to elevated AST and TSH.  He would like to continue to hold off on antiarrhythmics until he goes back into atrial fibrillation.  We Aristea Posada monitor his burden through his pacemaker.  3.  Moderate aortic stenosis: Followed by primary cardiology  4.  Hyperlipidemia: Crestor per primary care    Current medicines are reviewed at length with the patient today.   The patient does not have concerns regarding his medicines.  The following changes were made today: None  Labs/ tests ordered today include:  Orders Placed This Encounter  Procedures  . EKG 12-Lead     Disposition:   FU with Yamin Swingler 6 months  Signed, Bryndle Corredor Meredith Leeds, MD  03/31/2020 11:46 AM     [facility] [address] [place] [address] [phone] (office) [phone] (fax)

## 2020-03-31 NOTE — Patient Instructions (Addendum)
Medication Instructions:  Your physician recommends that you continue on your current medications as directed. Please refer to the Current Medication list given to you today.  *If you need a refill on your cardiac medications before your next appointment, please call your pharmacy*   Lab Work: None ordered  Testing/Procedures: None ordered   Follow-Up: At [facility], you and your health needs are our priority.  As part of our continuing mission to provide you with exceptional heart care, we have created designated Provider Care Teams.  These Care Teams include your primary Cardiologist (physician) and Advanced Practice Providers (APPs -  Physician Assistants and Nurse Practitioners) who all work together to provide you with the care you need, when you need it.  Remote monitoring is used to monitor your Pacemaker or ICD from home. This monitoring reduces the number of office visits required to check your device to one time per year. It allows [place] to keep an eye on the functioning of your device to ensure it is working properly. You are scheduled for a device check from home on 06/09/2020. You may send your transmission at any time that day. If you have a wireless device, the transmission will be sent automatically. After your physician reviews your transmission, you will receive a postcard with your next transmission date.  Your next appointment:   6 month(s)  The format for your next appointment:   In Person  Provider:   Allegra Lai, MD   Thank you for choosing [facility]!!   Trinidad Curet, RN [phone]

## 2020-04-10 DIAGNOSIS — N481 Balanitis: Secondary | ICD-10-CM

## 2020-04-10 HISTORY — DX: Balanitis: N48.1

## 2020-04-29 ENCOUNTER — Other Ambulatory Visit: Payer: Self-pay | Admitting: Cardiology

## 2020-04-29 NOTE — Telephone Encounter (Signed)
Refill sent to pharmacy.   

## 2020-05-30 ENCOUNTER — Telehealth: Payer: Self-pay | Admitting: Cardiology

## 2020-05-30 NOTE — Telephone Encounter (Signed)
   [facility] Pre-operative Risk Assessment    Request for surgical clearance:  1. What type of surgery is being performed? CT Myelogram   2. When is this surgery scheduled? 06/05/2020   3. What type of clearance is required (medical clearance vs. Pharmacy clearance to hold med vs. Both)? Pharmacy Clearance  4. Are there any medications that need to be held prior to surgery and how long? Xarelto, 2 days prior   5. Practice name and name of physician performing surgery? [facility] states a radiologist from the hospital will inject.    6. What is your office phone number? [phone] (ext#: [phone])    7.   What is your office fax number? [phone] (attn: Mickel Baas)   8.   Anesthesia type (None, local, MAC, general) ? Per Mickel Baas, no anesthesia will be used.   Zara Council 05/30/2020, 3:39 PM  _________________________________________________________________

## 2020-05-30 NOTE — Telephone Encounter (Signed)
Patient with diagnosis of afib on Xarelto for anticoagulation.    Procedure: CT Myelogram  Date of procedure: 06/05/20    CHA2DS2-VASc Score = 4  This indicates a 4.8% annual risk of stroke. The patient's score is based upon: CHF History: Yes HTN History: Yes Diabetes History: No Stroke History: No Vascular Disease History: No Age Score: 2 Gender Score: 0      CrCl 83 ml/min  Per office protocol, patient can hold Xarelto for 3 days prior to procedure.

## 2020-05-30 NOTE — Telephone Encounter (Signed)
   Primary Cardiologist: [name], MD  Chart reviewed as part of pre-operative protocol coverage.  Per pharmacy recommendations, patient can hold xarelto 3 days prior to his upcoming myelogram and should restart his xarelto as soon as he is cleared to do so by his Psychologist, sport and exercise.  I contacted the patient and left a detailed voicemail with instruction to hold xarelto starting 06/02/20 in preparation for his myelogram with recommendations to contact this office if he has any questions.  I will route this recommendation to the requesting party via Epic fax function and remove from pre-op pool.  Please call with questions.  Abigail Butts, PA-C 05/30/2020, 8:27 PM

## 2020-06-06 DIAGNOSIS — N410 Acute prostatitis: Secondary | ICD-10-CM | POA: Insufficient documentation

## 2020-06-06 HISTORY — DX: Acute prostatitis: N41.0

## 2020-06-09 ENCOUNTER — Ambulatory Visit (INDEPENDENT_AMBULATORY_CARE_PROVIDER_SITE_OTHER): Payer: Medicare Other

## 2020-06-09 DIAGNOSIS — I442 Atrioventricular block, complete: Secondary | ICD-10-CM

## 2020-06-12 LAB — CUP PACEART REMOTE DEVICE CHECK
Battery Impedance: 1384 Ohm
Battery Remaining Longevity: 46 mo
Battery Voltage: 2.77 V
Brady Statistic AP VP Percent: 46 %
Brady Statistic AP VS Percent: 0 %
Brady Statistic AS VP Percent: 54 %
Brady Statistic AS VS Percent: 0 %
Date Time Interrogation Session: 20220120080333
Implantable Lead Implant Date: 20131113
Implantable Lead Implant Date: 20131113
Implantable Lead Location: 753859
Implantable Lead Location: 753860
Implantable Lead Model: 5076
Implantable Lead Model: 5092
Implantable Pulse Generator Implant Date: 20131113
Lead Channel Impedance Value: 402 Ohm
Lead Channel Impedance Value: 658 Ohm
Lead Channel Pacing Threshold Amplitude: 0.5 V
Lead Channel Pacing Threshold Amplitude: 0.75 V
Lead Channel Pacing Threshold Pulse Width: 0.4 ms
Lead Channel Pacing Threshold Pulse Width: 0.4 ms
Lead Channel Setting Pacing Amplitude: 2 V
Lead Channel Setting Pacing Amplitude: 2.5 V
Lead Channel Setting Pacing Pulse Width: 0.4 ms
Lead Channel Setting Sensing Sensitivity: 4 mV

## 2020-06-13 ENCOUNTER — Telehealth: Payer: Self-pay

## 2020-06-13 NOTE — Telephone Encounter (Signed)
   Primary Cardiologist: [name], MD  Chart reviewed as part of pre-operative protocol coverage. Given past medical history and time since last visit, based on ACC/AHA guidelines, EQUAN COGBILL would be at acceptable risk for the planned procedure without further cardiovascular testing.   Patient with diagnosis of afib on Xarelto for anticoagulation.    Procedure:Lumbar 4-5 Posterior Lumbar Interbody Fusion  Date of procedure: TBD  CHA2DS2-VASc Score = 4  This indicates a 4.8% annual risk of stroke. The patient's score is based upon: CHF History: Yes HTN History: Yes Diabetes History: No Stroke History: No Vascular Disease History: No Age Score: 2 Gender Score: 0  CrCl >157mL/min Platelet count 101K  Per office protocol, patient can hold Xarelto for 3 days prior to procedure.   I will route this recommendation to the requesting party via Epic fax function and remove from pre-op pool.  Please call with questions.  Jossie Ng. Elli Groesbeck NP-C    06/13/2020, 2:34 PM [place] Office [phone] Fax [phone]

## 2020-06-13 NOTE — Telephone Encounter (Signed)
   [facility] Pre-operative Risk Assessment    HEARTCARE STAFF: - Please ensure there is not already an duplicate clearance open for this procedure. - Under Visit Info/Reason for Call, type in Other and utilize the format Clearance MM/DD/YY or Clearance TBD. Do not use dashes or single digits. - If request is for dental extraction, please clarify the # of teeth to be extracted.  Request for surgical clearance:  1. What type of surgery is being performed? Lumbar 4-5 Posterior Lumbar Interbody Fusion   2. When is this surgery scheduled? TBD   3. What type of clearance is required (medical clearance vs. Pharmacy clearance to hold med vs. Both)? Both  4. Are there any medications that need to be held prior to surgery and how long?Xarelto   5. Practice name and name of physician performing surgery? [facility]- Dr.Shakeel Donivan Scull   6. What is the office phone number? [phone]   7.   What is the office fax number? [phone]  8.   Anesthesia type (None, local, MAC, general) ? General   Frederick Vasquez 06/13/2020, 11:25 AM  _________________________________________________________________   (provider comments below)

## 2020-06-13 NOTE — Telephone Encounter (Signed)
Patient with diagnosis of afib on Xarelto for anticoagulation.    Procedure: Lumbar 4-5 Posterior Lumbar Interbody Fusion   Date of procedure: TBD  CHA2DS2-VASc Score = 4  This indicates a 4.8% annual risk of stroke. The patient's score is based upon: CHF History: Yes HTN History: Yes Diabetes History: No Stroke History: No Vascular Disease History: No Age Score: 2 Gender Score: 0  CrCl >137mL/min Platelet count 101K  Per office protocol, patient can hold Xarelto for 3 days prior to procedure.

## 2020-06-15 DIAGNOSIS — M48062 Spinal stenosis, lumbar region with neurogenic claudication: Secondary | ICD-10-CM

## 2020-06-15 HISTORY — DX: Spinal stenosis, lumbar region with neurogenic claudication: M48.062

## 2020-06-17 DIAGNOSIS — I10 Essential (primary) hypertension: Secondary | ICD-10-CM | POA: Insufficient documentation

## 2020-06-17 DIAGNOSIS — Z950 Presence of cardiac pacemaker: Secondary | ICD-10-CM | POA: Insufficient documentation

## 2020-06-17 DIAGNOSIS — I5030 Unspecified diastolic (congestive) heart failure: Secondary | ICD-10-CM | POA: Insufficient documentation

## 2020-06-17 DIAGNOSIS — I499 Cardiac arrhythmia, unspecified: Secondary | ICD-10-CM | POA: Insufficient documentation

## 2020-06-17 DIAGNOSIS — M1990 Unspecified osteoarthritis, unspecified site: Secondary | ICD-10-CM | POA: Insufficient documentation

## 2020-06-18 ENCOUNTER — Encounter: Payer: Self-pay | Admitting: *Deleted

## 2020-06-18 ENCOUNTER — Other Ambulatory Visit: Payer: Self-pay

## 2020-06-18 ENCOUNTER — Encounter: Payer: Self-pay | Admitting: Cardiology

## 2020-06-18 ENCOUNTER — Ambulatory Visit (INDEPENDENT_AMBULATORY_CARE_PROVIDER_SITE_OTHER): Payer: Medicare Other | Admitting: Cardiology

## 2020-06-18 ENCOUNTER — Telehealth: Payer: Self-pay | Admitting: *Deleted

## 2020-06-18 VITALS — BP 114/64 | HR 66 | Ht 70.0 in | Wt 252.0 lb

## 2020-06-18 DIAGNOSIS — Z950 Presence of cardiac pacemaker: Secondary | ICD-10-CM | POA: Diagnosis not present

## 2020-06-18 DIAGNOSIS — Z01818 Encounter for other preprocedural examination: Secondary | ICD-10-CM

## 2020-06-18 DIAGNOSIS — I480 Paroxysmal atrial fibrillation: Secondary | ICD-10-CM

## 2020-06-18 DIAGNOSIS — I2510 Atherosclerotic heart disease of native coronary artery without angina pectoris: Secondary | ICD-10-CM

## 2020-06-18 DIAGNOSIS — I350 Nonrheumatic aortic (valve) stenosis: Secondary | ICD-10-CM

## 2020-06-18 DIAGNOSIS — Z952 Presence of prosthetic heart valve: Secondary | ICD-10-CM

## 2020-06-18 DIAGNOSIS — I10 Essential (primary) hypertension: Secondary | ICD-10-CM

## 2020-06-18 NOTE — Patient Instructions (Addendum)
Medication Instructions:  Your physician recommends that you continue on your current medications as directed. Please refer to the Current Medication list given to you today.  *If you need a refill on your cardiac medications before your next appointment, please call your pharmacy*   Lab Work: None If you have labs (blood work) drawn today and your tests are completely normal, you will receive your results only by: Marland Kitchen MyChart Message (if you have MyChart) OR . A paper copy in the mail If you have any lab test that is abnormal or we need to change your treatment, we will call you to review the results.   Testing/Procedures: Your physician has requested that you have an echocardiogram. Echocardiography is a painless test that uses sound waves to create images of your heart. It provides your doctor with information about the size and shape of your heart and how well your heart's chambers and valves are working. This procedure takes approximately one hour. There are no restrictions for this procedure.    [facility] [address] [place], [place] [address] Phone:  [phone]    Please arrive 15 minutes prior to your appointment time for registration and insurance purposes.  The test will take approximately 3 to 4 hours to complete; you may bring reading material.  If someone comes with you to your appointment, they will need to remain in the main lobby due to limited space in the testing area. **If you are pregnant or breastfeeding, please notify the nuclear lab prior to your appointment**  How to prepare for your Myocardial Perfusion Test: . Do not eat or drink 3 hours prior to your test, except you may have water. . Do not consume products containing caffeine (regular or decaffeinated) 12 hours prior to your test. (ex: coffee, chocolate, sodas, tea). . Do bring a list of your current medications with you.  If not listed below, you may take your medications  as normal.  . Do wear comfortable clothes (no dresses or overalls) and walking shoes, tennis shoes preferred (No heels or open toe shoes are allowed). . Do NOT wear cologne, perfume, aftershave, or lotions (deodorant is allowed). . If these instructions are not followed, your test will have to be rescheduled.  Please report to [address] for your test.  If you have questions or concerns about your appointment, you can call the [facility] at [phone].  If you cannot keep your appointment, please provide 24 hours notification to the Nuclear Lab, to avoid a possible $50 charge to your account.     Follow-Up: At [facility], you and your health needs are our priority.  As part of our continuing mission to provide you with exceptional heart care, we have created designated Provider Care Teams.  These Care Teams include your primary Cardiologist (physician) and Advanced Practice Providers (APPs -  Physician Assistants and Nurse Practitioners) who all work together to provide you with the care you need, when you need it.  We recommend signing up for the patient portal called "MyChart".  Sign up information is provided on this After Visit Summary.  MyChart is used to connect with patients for Virtual Visits (Telemedicine).  Patients are able to view lab/test results, encounter notes, upcoming appointments, etc.  Non-urgent messages can be sent to your provider as well.   To learn more about what you can do with MyChart, go to [email or web address].    Your next appointment:  6 month(s)  The format for your next appointment:   In Person  Provider:   [name], MD   Other Instructions   Echocardiogram An echocardiogram is a test that uses sound waves (ultrasound) to produce images of the heart. Images from an echocardiogram can provide important information about:  Heart size and shape.  The size and thickness and movement of  your heart's walls.  Heart muscle function and strength.  Heart valve function or if you have stenosis. Stenosis is when the heart valves are too narrow.  If blood is flowing backward through the heart valves (regurgitation).  A tumor or infectious growth around the heart valves.  Areas of heart muscle that are not working well because of poor blood flow or injury from a heart attack.  Aneurysm detection. An aneurysm is a weak or damaged part of an artery wall. The wall bulges out from the normal force of blood pumping through the body. Tell a health care provider about:  Any allergies you have.  All medicines you are taking, including vitamins, herbs, eye drops, creams, and over-the-counter medicines.  Any blood disorders you have.  Any surgeries you have had.  Any medical conditions you have.  Whether you are pregnant or may be pregnant. What are the risks? Generally, this is a safe test. However, problems may occur, including an allergic reaction to dye (contrast) that may be used during the test. What happens before the test? No specific preparation is needed. You may eat and drink normally. What happens during the test?  You will take off your clothes from the waist up and put on a hospital gown.  Electrodes or electrocardiogram (ECG)patches may be placed on your chest. The electrodes or patches are then connected to a device that monitors your heart rate and rhythm.  You will lie down on a table for an ultrasound exam. A gel will be applied to your chest to help sound waves pass through your skin.  A handheld device, called a transducer, will be pressed against your chest and moved over your heart. The transducer produces sound waves that travel to your heart and bounce back (or "echo" back) to the transducer. These sound waves will be captured in real-time and changed into images of your heart that can be viewed on a video monitor. The images will be recorded on a  computer and reviewed by your health care provider.  You may be asked to change positions or hold your breath for a short time. This makes it easier to get different views or better views of your heart.  In some cases, you may receive contrast through an IV in one of your veins. This can improve the quality of the pictures from your heart. The procedure may vary among health care providers and hospitals.   What can I expect after the test? You may return to your normal, everyday life, including diet, activities, and medicines, unless your health care provider tells you not to do that. Follow these instructions at home:  It is up to you to get the results of your test. Ask your health care provider, or the department that is doing the test, when your results will be ready.  Keep all follow-up visits. This is important. Summary  An echocardiogram is a test that uses sound waves (ultrasound) to produce images of the heart.  Images from an echocardiogram can provide important information about the size and shape of your heart, heart muscle function, heart valve  function, and other possible heart problems.  You do not need to do anything to prepare before this test. You may eat and drink normally.  After the echocardiogram is completed, you may return to your normal, everyday life, unless your health care provider tells you not to do that. This information is not intended to replace advice given to you by your health care provider. Make sure you discuss any questions you have with your health care provider. Document Revised: 01/01/2020 Document Reviewed: 01/01/2020 Elsevier Patient Education  2021 [employer].

## 2020-06-18 NOTE — Progress Notes (Unsigned)
Cardiology Office Note:    Date:  06/18/2020   ID:  Frederick Vasquez, DOB Jan 23, 1941, MRN [id number]  PCP:  Algis Greenhouse, MD  Cardiologist:  [name], MD    Referring MD: Algis Greenhouse, MD   Chief Complaint  Patient presents with  . Follow-up  I need to have back surgery  History of Present Illness:    Frederick Vasquez is a 80 y.o. male with past medical history significant for coronary artery disease, status post TAVI done in 2019, it was 26 mm Edwards Sapiens 3 valve, paroxysmal atrial fibrillation, dual-chamber pacemaker, essential hypertension, dyslipidemia, chronic back problem.  She is scheduled to have fusion surgery on his back.  Denies have any cardiac complaint no chest pain tightness squeezing pressure burning chest.  He comes to me with his daughter.  She tells me that he can walk however he walks very slowly.  Biggest issue of course is chronic back problem.  Past Medical History:  Diagnosis Date  . Acute prostatitis 06/06/2020   Formatting of this note might be different from the original. 06/06/2020  . Aortic stenosis 05/15/2017   Severe by echo from October 2019  Formatting of this note might be different from the original. Formatting of this note might be different from the original. Severe by echo from October 2019  . Arthritis    bilateral hands and knees  . 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 Chest CT 01/18/2019 2019: [employer] exposure 2019: CXR: IMPRESSION: 1. 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 2. No active infiltrate or effusion. 2019: PULM eval  . Atrial fibrillation ([place])    Xarelto  . Balanitis 04/10/2020   Formatting of this note might be different from the original. 04/10/2020  . Chronic anticoagulation 06/14/2017   CHADS VASC=4  . Chronic diastolic CHF (congestive heart failure) ([place]) 05/15/2017  . Contact with and (suspected)  exposure to asbestos 11/17/2017   Formatting of this note might be different from the original. 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 Diarrhea of presumed infectious origin 09/25/2019   Formatting of this note might be different from the original. 2021  . Diastolic heart failure ([place])   . Dyslipidemia 11/19/2014  . Dysrhythmia    A- Fib  . Elevated troponin 05/15/2017  . Encounter for administration of vaccine 11/19/2014  . Essential hypertension 05/15/2017  . Exacerbation of chronic bronchiolitis ([place]) 02/28/2020   Formatting of this note might be different from the original. 02/28/2020  . Gastrointestinal hemorrhage 09/25/2019   Formatting of this note might be different from the original. 2021  . Hemoptysis 05/15/2017   Formatting of this note might be different from the original. 2019  . History of aortic valvular stenosis 02/26/2016   Last Assessment & Plan:  Formatting of this note might be different from the original. Moderate.  Marland Kitchen History of gout 08/27/2015  . Hyperlipidemia   . Hypertension   . Hypothyroidism 09/13/2019   Formatting of this note might be different from the original. 2021: TSH 41  . Impaired glucose tolerance   . Lung nodule 01/25/2019   Formatting of this note is different from the original. Chest CT 01/18/2019 notes a stable 4 mm right middle lobe lung nodule- recheck chest CT August 2021  . Normal coronary arteries 06/14/2017   2014 cath in [place]  .  Pacemaker   . Paroxysmal atrial fibrillation ([place]) 11/19/2014   Xarelto Formatting of this note might be different from the original. Managed CARDS  Formatting of this note might be different from the original. As detected by pacemaker  . Presence of cardiac pacemaker 11/19/2014  . 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 PFT 01/20/2019 ratio 81%, FEV1 59%, FVC 55%, DLCO 51%  . 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 Sepsis ([place]) 05/14/2017   Admitted with possible sepsis after an episode of epistaxis 05/15/17-tx'd from [facility] Blood cultures were negative, no TEE done  . Severe malnutrition ([place]) 05/15/2017  . Spinal stenosis of lumbar region with neurogenic claudication 06/15/2020   Formatting of this note might be different from the original. 05/2020: ORTHO, myelogram, surg rec  . Suspected COVID-19 virus infection 09/25/2019   Formatting of this note might be different from the original. 2021  . Thrombocytopenia ([place]) 05/15/2017  . Type 2 diabetes mellitus without complication, without long-term current use of insulin ([place]) 08/27/2015   Formatting of this note might be different from the original. 2019: 99/5.3 2020: 125/5.7 2021: 126/6.6    Past Surgical History:  Procedure Laterality Date  . CARDIAC CATHETERIZATION  2014   non-obs dz, done at [facility]  . CARDIOVERSION N/A 09/02/2017   Procedure: CARDIOVERSION;  Surgeon: Lelon Perla, MD;  Location: [facility] ENDOSCOPY;  Service: Cardiovascular;  Laterality: N/A;  . COLONOSCOPY    . EYE SURGERY     bilateral cataracts  . PACEMAKER IMPLANT  03/2012  . REPLACEMENT TOTAL KNEE Left   . RIGHT/LEFT HEART CATH AND CORONARY ANGIOGRAPHY N/A 03/28/2018   Procedure: RIGHT/LEFT HEART CATH AND CORONARY ANGIOGRAPHY;  Surgeon: Belva Crome, MD;  Location: [facility] CV LAB;  Service: Cardiovascular;  Laterality: N/A;  . TEE WITHOUT CARDIOVERSION N/A 05/12/2018   Procedure: TRANSESOPHAGEAL ECHOCARDIOGRAM (TEE);  Surgeon: Sherren Mocha, MD;  Location: [place];  Service: Open Heart Surgery;  Laterality: N/A;  . TRANSCATHETER AORTIC VALVE REPLACEMENT, TRANSFEMORAL N/A 05/12/2018   Procedure: TRANSCATHETER AORTIC VALVE REPLACEMENT, TRANSFEMORAL;  Surgeon: Sherren Mocha, MD;  Location: [facility] [place];  Service: Open Heart Surgery;  Laterality: N/A;  .  VASECTOMY      Current Medications: Current Meds  Medication Sig  . acetaminophen (TYLENOL) 500 MG tablet Take 1,000 mg by mouth every 6 (six) hours as needed for moderate pain or headache.  . metoprolol tartrate (LOPRESSOR) 50 MG tablet TAKE 1 TABLET(50 MG) BY MOUTH TWICE DAILY  . nitroGLYCERIN (NITROSTAT) 0.4 MG SL tablet Place 0.4 mg under the tongue every 5 (five) minutes as needed for chest pain.  . rosuvastatin (CRESTOR) 20 MG tablet TAKE 1 TABLET(20 MG) BY MOUTH AT BEDTIME  . XARELTO 20 MG TABS tablet TAKE 1 TABLET(20 MG) BY MOUTH DAILY WITH SUPPER     Allergies:   Sulfamethoxazole-trimethoprim, Allopurinol, and Atorvastatin   Social History   Socioeconomic History  . Marital status: Married    Spouse name: Not on file  . Number of children: Not on file  . Years of education: Not on file  . Highest education level: Not on file  Occupational History  . Occupation: retired  Tobacco Use  . Smoking status: Former Smoker    Quit date: 1978    Years since quitting: 44.0  . Smokeless tobacco: Never Used  Vaping  Use  . Vaping Use: Never used  Substance and Sexual Activity  . Alcohol use: No  . Drug use: No  . Sexual activity: Not on file  Other Topics Concern  . Not on file  Social History Narrative   Pt lives in [place] Determinants of Health   Financial Resource Strain: Not on file  Food Insecurity: Not on file  Transportation Needs: Not on file  Physical Activity: Not on file  Stress: Not on file  Social Connections: Not on file     Family History: The patient's family history includes CAD in his father; Colon cancer in his sister; Stroke in his father. ROS:   Please see the history of present illness.    All 14 point review of systems negative except as described per history of present illness  EKGs/Labs/Other Studies Reviewed:      Recent Labs: 09/10/2019: ALT 38; BUN 15; Creatinine, Ser 0.96; Hemoglobin 13.2; Platelets 94; Potassium 4.3;  Sodium 137; TSH 41.700  Recent Lipid Panel No results found for: CHOL, TRIG, HDL, CHOLHDL, VLDL, LDLCALC, LDLDIRECT  Physical Exam:    VS:  BP 114/64 (BP Location: Left Arm, Patient Position: Sitting, Cuff Size: Large)   Pulse 66   Ht 5\' 10"  (1.778 m)   Wt 252 lb (114.3 kg)   SpO2 96%   BMI 36.16 kg/m     Wt Readings from Last 3 Encounters:  06/18/20 252 lb (114.3 kg)  03/31/20 275 lb (124.7 kg)  09/10/19 273 lb 3.2 oz (123.9 kg)     GEN:  Well nourished, well developed in no acute distress HEENT: Normal NECK: No JVD; No carotid bruits LYMPHATICS: No lymphadenopathy CARDIAC: RRR, no murmurs, no rubs, no gallops RESPIRATORY:  Clear to auscultation without rales, wheezing or rhonchi  ABDOMEN: Soft, non-tender, non-distended MUSCULOSKELETAL:  No edema; No deformity  SKIN: Warm and dry LOWER EXTREMITIES: no swelling NEUROLOGIC:  Alert and oriented x 3 PSYCHIATRIC:  Normal affect   ASSESSMENT:    1. Pre-op evaluation   2. Nonrheumatic aortic valve stenosis   3. S/P TAVR (transcatheter aortic valve replacement)   4. Pacemaker   5. Paroxysmal atrial fibrillation (HCC)   6. Primary hypertension   7. Coronary artery disease involving native coronary artery of native heart without angina pectoris    PLAN:    In order of problems listed above:  1. Cardiovascular preop evaluation for this gentleman with a Tavi implanted few years ago, will schedule him to have echocardiogram to assess the valve.  On top of that I did review his cardiac catheterization from 2019 which showed some borderline lesions.  I think will be reasonable to perform stress test on him since his ability to exercise is limited secondary to chronic back problem. 2. Paroxysmal atrial fibrillation.  Seems to maintain sinus rhythm.  Will be able to hold his Xarelto for few days around surgical time. 3. Pacemaker present functioning normally we will continue monitoring. 4. Essential hypertension blood pressure well  controlled continue present management.   Medication Adjustments/Labs and Tests Ordered: Current medicines are reviewed at length with the patient today.  Concerns regarding medicines are outlined above.  Orders Placed This Encounter  Procedures  . MYOCARDIAL PERFUSION IMAGING  . ECHOCARDIOGRAM COMPLETE   Medication changes: No orders of the defined types were placed in this encounter.   Signed, Park Liter, MD, [facility] 06/18/2020 12:07 PM    [place]

## 2020-06-18 NOTE — Progress Notes (Signed)
Echo

## 2020-06-18 NOTE — Telephone Encounter (Signed)
Left message on voicemail per DPR in reference to upcoming appointment scheduled on 06/25/20 at 0800 with detailed instructions given per Myocardial Perfusion Study Information Sheet for the test. LM to arrive 15 minutes early, and that it is imperative to arrive on time for appointment to keep from having the test rescheduled. If you need to cancel or reschedule your appointment, please call the office within 24 hours of your appointment. Failure to do so may result in a cancellation of your appointment, and a $50 no show fee. Phone number given for call back for any questions.  Mychart letter sent with instructions.Talmadge Ganas, [place]

## 2020-06-19 ENCOUNTER — Other Ambulatory Visit: Payer: Self-pay | Admitting: Cardiology

## 2020-06-19 DIAGNOSIS — Z01818 Encounter for other preprocedural examination: Secondary | ICD-10-CM | POA: Diagnosis not present

## 2020-06-19 LAB — PROTIME-INR

## 2020-06-19 NOTE — Telephone Encounter (Signed)
Refill sent to pharmacy.   

## 2020-06-23 DIAGNOSIS — I340 Nonrheumatic mitral (valve) insufficiency: Secondary | ICD-10-CM | POA: Diagnosis not present

## 2020-06-23 DIAGNOSIS — I342 Nonrheumatic mitral (valve) stenosis: Secondary | ICD-10-CM | POA: Diagnosis not present

## 2020-06-23 DIAGNOSIS — I361 Nonrheumatic tricuspid (valve) insufficiency: Secondary | ICD-10-CM | POA: Diagnosis not present

## 2020-06-24 NOTE — Progress Notes (Signed)
Remote pacemaker transmission.   

## 2020-06-25 ENCOUNTER — Ambulatory Visit (INDEPENDENT_AMBULATORY_CARE_PROVIDER_SITE_OTHER): Payer: Medicare Other

## 2020-06-25 ENCOUNTER — Other Ambulatory Visit: Payer: Self-pay

## 2020-06-25 DIAGNOSIS — Z01818 Encounter for other preprocedural examination: Secondary | ICD-10-CM | POA: Diagnosis not present

## 2020-06-25 DIAGNOSIS — I350 Nonrheumatic aortic (valve) stenosis: Secondary | ICD-10-CM

## 2020-06-25 LAB — MYOCARDIAL PERFUSION IMAGING
Peak HR: 71 {beats}/min
Rest HR: 61 {beats}/min
SDS: 2
SRS: 2
SSS: 4
TID: 1.12

## 2020-06-25 MED ORDER — TECHNETIUM TC 99M TETROFOSMIN IV KIT
10.8000 | PACK | Freq: Once | INTRAVENOUS | Status: AC | PRN
  Administered 2020-06-25: 10.8 via INTRAVENOUS

## 2020-06-25 MED ORDER — REGADENOSON 0.4 MG/5ML IV SOLN
0.4000 mg | Freq: Once | INTRAVENOUS | Status: AC
  Administered 2020-06-25: 0.4 mg via INTRAVENOUS

## 2020-06-25 MED ORDER — TECHNETIUM TC 99M TETROFOSMIN IV KIT
30.8000 | PACK | Freq: Once | INTRAVENOUS | Status: AC | PRN
  Administered 2020-06-25: 30.8 via INTRAVENOUS

## 2020-06-27 ENCOUNTER — Telehealth: Payer: Self-pay | Admitting: *Deleted

## 2020-06-27 NOTE — Telephone Encounter (Signed)
   Primary Cardiologist: [name], MD  Chart reviewed as part of pre-operative protocol coverage.  This is a duplicate request. Please see telephone note 06/13/20.   Additionally, this patient was seen by Dr. Agustin Cree in the office 06/18/20 for preop assessment and recommended to undergo a NST and echocardiogram.   I will remove from the preop pool at this time as Dr. Agustin Cree is managing this patients preop assessment.   I will route to the requesting office to inform them of the anticipated work-up.   Please call with questions.  Abigail Butts, PA-C 06/27/2020, 2:44 PM

## 2020-06-27 NOTE — Telephone Encounter (Signed)
   [facility] Pre-operative Risk Assessment    Request for surgical clearance:  1. What type of surgery is being performed? Lumbar 4-5 Posterior Lumbar Interbody Fusion   2. When is this surgery scheduled? TBD   3. What type of clearance is required (medical clearance vs. Pharmacy clearance to hold med vs. Both)? Both  4. Are there any medications that need to be held prior to surgery and how long?Xarelto   5. Practice name and name of physician performing surgery? [place]. Dr. Creig Hines, MD   6. What is your office phone number [phone]    7.   What is your office fax number [phone]  8.   Anesthesia type (None, local, MAC, general) ? General   Frederick Vasquez 06/27/2020, 9:51 AM  _________________________________________________________________   (provider comments below)

## 2020-07-03 ENCOUNTER — Other Ambulatory Visit: Payer: Self-pay | Admitting: Cardiology

## 2020-07-03 NOTE — Telephone Encounter (Signed)
Metoprolol tartrate 50 mg # 60 tablets with 6 refills sent to [facility] at [address] , [place], [place]

## 2020-07-08 ENCOUNTER — Telehealth: Payer: Self-pay | Admitting: *Deleted

## 2020-07-08 NOTE — Telephone Encounter (Signed)
This pt has had all the testing that was required of him. Please inform of clearance for back surgery asap

## 2020-07-08 NOTE — Telephone Encounter (Signed)
He is acceptable candidate for spinal surgery from cardiac point of view.  Please attached to the letter with this the fact he is stress test.

## 2020-07-09 NOTE — Telephone Encounter (Signed)
   Primary Cardiologist: [name], MD  Chart reviewed as part of pre-operative protocol coverage.   Please seen note by Dr. Agustin Cree providing clearance for surgery. In addition to this note, a copy of his office note and stress test will be faxed to the surgeon's office.   Please call with questions. Richardson Dopp, PA-C 07/09/2020, 1:07 PM

## 2020-07-09 NOTE — Telephone Encounter (Signed)
Notes faxed to surgeon. This phone note will be removed from the preop pool. Richardson Dopp, PA-C  07/09/2020 1:10 PM

## 2020-09-08 ENCOUNTER — Ambulatory Visit (INDEPENDENT_AMBULATORY_CARE_PROVIDER_SITE_OTHER): Payer: Medicare Other

## 2020-09-08 DIAGNOSIS — I442 Atrioventricular block, complete: Secondary | ICD-10-CM | POA: Diagnosis not present

## 2020-09-10 LAB — CUP PACEART REMOTE DEVICE CHECK
Battery Impedance: 1495 Ohm
Battery Remaining Longevity: 42 mo
Battery Voltage: 2.76 V
Brady Statistic AP VP Percent: 51 %
Brady Statistic AP VS Percent: 0 %
Brady Statistic AS VP Percent: 49 %
Brady Statistic AS VS Percent: 0 %
Date Time Interrogation Session: 20220418083946
Implantable Lead Implant Date: 20131113
Implantable Lead Implant Date: 20131113
Implantable Lead Location: 753859
Implantable Lead Location: 753860
Implantable Lead Model: 5076
Implantable Lead Model: 5092
Implantable Pulse Generator Implant Date: 20131113
Lead Channel Impedance Value: 378 Ohm
Lead Channel Impedance Value: 654 Ohm
Lead Channel Pacing Threshold Amplitude: 0.5 V
Lead Channel Pacing Threshold Amplitude: 0.625 V
Lead Channel Pacing Threshold Pulse Width: 0.4 ms
Lead Channel Pacing Threshold Pulse Width: 0.4 ms
Lead Channel Setting Pacing Amplitude: 2 V
Lead Channel Setting Pacing Amplitude: 2.5 V
Lead Channel Setting Pacing Pulse Width: 0.4 ms
Lead Channel Setting Sensing Sensitivity: 4 mV

## 2020-09-18 ENCOUNTER — Other Ambulatory Visit: Payer: Self-pay | Admitting: Cardiology

## 2020-09-25 NOTE — Progress Notes (Signed)
Remote pacemaker transmission.   

## 2020-10-28 DIAGNOSIS — I6523 Occlusion and stenosis of bilateral carotid arteries: Secondary | ICD-10-CM | POA: Insufficient documentation

## 2020-12-08 ENCOUNTER — Ambulatory Visit (INDEPENDENT_AMBULATORY_CARE_PROVIDER_SITE_OTHER): Payer: Medicare Other

## 2020-12-08 DIAGNOSIS — I442 Atrioventricular block, complete: Secondary | ICD-10-CM | POA: Diagnosis not present

## 2020-12-09 ENCOUNTER — Other Ambulatory Visit: Payer: Self-pay

## 2020-12-09 LAB — CUP PACEART REMOTE DEVICE CHECK
Battery Impedance: 1550 Ohm
Battery Remaining Longevity: 42 mo
Battery Voltage: 2.76 V
Brady Statistic AP VP Percent: 48 %
Brady Statistic AP VS Percent: 0 %
Brady Statistic AS VP Percent: 52 %
Brady Statistic AS VS Percent: 0 %
Date Time Interrogation Session: 20220718093712
Implantable Lead Implant Date: 20131113
Implantable Lead Implant Date: 20131113
Implantable Lead Location: 753859
Implantable Lead Location: 753860
Implantable Lead Model: 5076
Implantable Lead Model: 5092
Implantable Pulse Generator Implant Date: 20131113
Lead Channel Impedance Value: 419 Ohm
Lead Channel Impedance Value: 673 Ohm
Lead Channel Pacing Threshold Amplitude: 0.5 V
Lead Channel Pacing Threshold Amplitude: 0.75 V
Lead Channel Pacing Threshold Pulse Width: 0.4 ms
Lead Channel Pacing Threshold Pulse Width: 0.4 ms
Lead Channel Setting Pacing Amplitude: 2 V
Lead Channel Setting Pacing Amplitude: 2.5 V
Lead Channel Setting Pacing Pulse Width: 0.4 ms
Lead Channel Setting Sensing Sensitivity: 4 mV

## 2020-12-10 ENCOUNTER — Ambulatory Visit (INDEPENDENT_AMBULATORY_CARE_PROVIDER_SITE_OTHER): Payer: Medicare Other | Admitting: Cardiology

## 2020-12-10 ENCOUNTER — Encounter: Payer: Self-pay | Admitting: Cardiology

## 2020-12-10 ENCOUNTER — Other Ambulatory Visit: Payer: Self-pay

## 2020-12-10 VITALS — BP 108/58 | HR 61 | Ht 70.0 in | Wt 256.0 lb

## 2020-12-10 DIAGNOSIS — E785 Hyperlipidemia, unspecified: Secondary | ICD-10-CM | POA: Diagnosis not present

## 2020-12-10 DIAGNOSIS — Z950 Presence of cardiac pacemaker: Secondary | ICD-10-CM

## 2020-12-10 DIAGNOSIS — Z952 Presence of prosthetic heart valve: Secondary | ICD-10-CM

## 2020-12-10 DIAGNOSIS — I480 Paroxysmal atrial fibrillation: Secondary | ICD-10-CM

## 2020-12-10 NOTE — Patient Instructions (Signed)

## 2020-12-10 NOTE — Progress Notes (Signed)
Cardiology Office Note:    Date:  12/10/2020   ID:  Frederick Vasquez, DOB 07/27/1940, MRN [id number]  PCP:  Algis Greenhouse, MD  Cardiologist:  [name], MD    Referring MD: Algis Greenhouse, MD   Chief Complaint  Patient presents with   Shortness of Breath    History of Present Illness:    Frederick Vasquez is a 80 y.o. male with past medical history significant for severe arctic stenosis, status post TAVI done in 2019 with 26 mm Edwards SAPIEN S3 valve, paroxysmal atrial fibrillation, dual-chamber pacemaker, essential hypertension, dyslipidemia, chronic back problem.  I did see him last time in January at that time he was evaluated before back surgery he went to surgery with no difficulties evaluation included stress test which showed no evidence of ischemia as well as echocardiogram which revealed normal valve function.  He comes today to my office for regular follow-up.  He like always complaining of having shortness of breath but this is nothing new he did have cardiac catheterization done in 2019 which did not show any obstructive coronary artery disease.  Recently he fell down he ended up fracturing his humerus.  He did not passed out he simply tripped on the gait and fell down.  He is scheduled to see orthopedic doctor to talk about management of this problem.  Past Medical History:  Diagnosis Date   Acute prostatitis 06/06/2020   Formatting of this note might be different from the original. 06/06/2020   Aortic stenosis 05/15/2017   Severe by echo from October 2019  Formatting of this note might be different from the original. Formatting of this note might be different from the original. 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 Chest CT 01/18/2019 2019: USN exposure 2019: CXR: IMPRESSION: 1. Calcified pleural plaque on the right with calcified hemidiaphragms  consistent with asbestos related disease. 2. No active infiltrate or effusion. 2019: PULM eval   Atrial fibrillation ([place])    Xarelto   Balanitis 04/10/2020   Formatting of this note might be different from the original. 04/10/2020   Chronic anticoagulation 06/14/2017   CHADS VASC=4   Chronic diastolic CHF (congestive heart failure) ([place]) 05/15/2017   Contact with and (suspected) exposure to asbestos 11/17/2017   Formatting of this note might be different from the original. 1961-64, [place] room with pipe insulation contact and movement 2018: pleural plaque on CT   Diarrhea of presumed infectious origin 09/25/2019   Formatting of this note might be different from the original. 0258   Diastolic heart failure ([place])    Dyslipidemia 11/19/2014   Dysrhythmia    A- Fib   Elevated troponin 05/15/2017   Encounter for administration of vaccine 11/19/2014   Essential hypertension 05/15/2017   Exacerbation of chronic bronchiolitis ([place]) 02/28/2020   Formatting of this note might be different from the original. 02/28/2020   Gastrointestinal hemorrhage 09/25/2019   Formatting of this note might be different from the original. 2021   Hemoptysis 05/15/2017   Formatting of this note might be different from the original. 2019   History of aortic valvular stenosis 02/26/2016   Last Assessment & Plan:  Formatting of this note might be different from the original. Moderate.   History of gout 08/27/2015   Hyperlipidemia    Hypertension    Hypothyroidism 09/13/2019  Formatting of this note might be different from the original. 2021: TSH 41   Impaired glucose tolerance    Lung nodule 01/25/2019   Formatting of this note is different from the original. Chest CT 01/18/2019 notes a stable 4 mm right middle lobe lung nodule- recheck chest CT August 2021   Normal coronary arteries 06/14/2017   2014 cath in [facility]   Pacemaker    Paroxysmal atrial fibrillation ([place]) 11/19/2014   Xarelto Formatting of this note might be  different from the original. Managed CARDS  Formatting of this note might be different from the original. 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 05/2020: ORTHO, myelogram, surg rec   Suspected COVID-19 virus infection 09/25/2019   Formatting of this note might be different from the original. 2021   Thrombocytopenia ([place]) 05/15/2017   Type 2 diabetes mellitus without complication, without long-term current use of insulin ([place]) 08/27/2015   Formatting of this note might be different from the original. 2019: 99/5.3 2020: 125/5.7 2021: 126/6.6    Past Surgical History:  Procedure Laterality Date   CARDIAC CATHETERIZATION  2014   non-obs dz, done at [facility] N/A 09/02/2017   Procedure: CARDIOVERSION;  Surgeon: Lelon Perla, MD;  Location: [facility];  Service: Cardiovascular;  Laterality: N/A;   COLONOSCOPY     EYE SURGERY     bilateral cataracts   PACEMAKER IMPLANT  03/2012   REPLACEMENT TOTAL KNEE Left    RIGHT/LEFT HEART CATH AND CORONARY ANGIOGRAPHY N/A 03/28/2018   Procedure: RIGHT/LEFT HEART CATH AND CORONARY ANGIOGRAPHY;  Surgeon: Belva Crome, MD;  Location: [facility];  Service: Cardiovascular;  Laterality: N/A;   TEE WITHOUT CARDIOVERSION N/A 05/12/2018   Procedure: TRANSESOPHAGEAL  ECHOCARDIOGRAM (TEE);  Surgeon: Sherren Mocha, MD;  Location: [facility];  Service: Open Heart Surgery;  Laterality: N/A;   TRANSCATHETER AORTIC VALVE REPLACEMENT, TRANSFEMORAL N/A 05/12/2018   Procedure: TRANSCATHETER AORTIC VALVE REPLACEMENT, TRANSFEMORAL;  Surgeon: Sherren Mocha, MD;  Location: [facility];  Service: Open Heart Surgery;  Laterality: N/A;   VASECTOMY      Current Medications: Current Meds  Medication Sig   acetaminophen (TYLENOL) 500 MG tablet Take 1,000 mg by mouth every 6 (six) hours as needed for moderate pain or headache.   ferrous sulfate 325 (65 FE) MG tablet Take 1 tablet by mouth daily.   metoprolol tartrate (LOPRESSOR) 50 MG tablet TAKE 1 TABLET(50 MG) BY MOUTH TWICE DAILY (Patient taking differently: Take 50 mg by mouth 2 (two) times daily.)   nitroGLYCERIN (NITROSTAT) 0.4 MG SL tablet Place 0.4 mg under the tongue every 5 (five) minutes as needed for chest pain.   pantoprazole (PROTONIX) 40 MG tablet Take 1 tablet by mouth daily.   rosuvastatin (CRESTOR) 20 MG tablet TAKE 1 TABLET(20 MG) BY MOUTH AT BEDTIME (Patient taking  differently: Take 20 mg by mouth daily.)   XARELTO 20 MG TABS tablet TAKE 1 TABLET(20 MG) BY MOUTH DAILY WITH SUPPER (Patient taking differently: Take 20 mg by mouth daily with supper.)     Allergies:   Sulfamethoxazole-trimethoprim, Allopurinol, and Atorvastatin   Social History   Socioeconomic History   Marital status: Married    Spouse name: Not on file   Number of children: Not on file   Years of education: Not on file   Highest education level: Not on file  Occupational History   Occupation: retired  Tobacco Use   Smoking status: Former    Types: Cigarettes    Quit date: 1978    Years since quitting: 44.5   Smokeless tobacco: Never  Vaping Use   Vaping Use: Never used  Substance and Sexual Activity   Alcohol use: No   Drug use: No   Sexual activity: Not on file  Other Topics Concern   Not on file  Social History Narrative    Pt lives in [place] Determinants of Health   Financial Resource Strain: Not on file  Food Insecurity: Not on file  Transportation Needs: Not on file  Physical Activity: Not on file  Stress: Not on file  Social Connections: Not on file     Family History: The patient's family history includes CAD in his father; Colon cancer in his sister; Stroke in his father. ROS:   Please see the history of present illness.    All 14 point review of systems negative except as described per history of present illness  EKGs/Labs/Other Studies Reviewed:    Stress test done in June 25, 2020 showed: There was no ST segment deviation noted during stress. No T wave inversion was noted during stress. Defect 1: There is a medium defect of mild severity present in the basal inferior location. Findings consistent with prior myocardial infarction. This was a nongated study. This is an intermediate risk study.      Recent Labs: No results found for requested labs within last 8760 hours.  Recent Lipid Panel No results found for: CHOL, TRIG, HDL, CHOLHDL, VLDL, LDLCALC, LDLDIRECT  Physical Exam:    VS:  BP (!) 108/58 (BP Location: Right Arm, Patient Position: Sitting)   Pulse 61   Ht 5\' 10"  (1.778 m)   Wt 256 lb (116.1 kg)   SpO2 93%   BMI 36.73 kg/m     Wt Readings from Last 3 Encounters:  12/10/20 256 lb (116.1 kg)  06/25/20 252 lb (114.3 kg)  06/18/20 252 lb (114.3 kg)     GEN:  Well nourished, well developed in no acute distress HEENT: Normal NECK: No JVD; No carotid bruits LYMPHATICS: No lymphadenopathy CARDIAC: RRR, no murmurs, no rubs, no gallops RESPIRATORY:  Clear to auscultation without rales, wheezing or rhonchi  ABDOMEN: Soft, non-tender, non-distended MUSCULOSKELETAL:  No edema; No deformity  SKIN: Warm and dry LOWER EXTREMITIES: no swelling NEUROLOGIC:  Alert and oriented x 3 PSYCHIATRIC:  Normal affect   ASSESSMENT:    1. S/P TAVR (transcatheter aortic  valve replacement)   2. Pacemaker   3. Dyslipidemia   4. Paroxysmal atrial fibrillation (HCC)    PLAN:    In order of problems listed above:  Status post TAVI.  Valve functioning properly based on last echocardiogram.  He does not have any symptomatology that would suggest reactivation of the problem.  In about 6 months we will repeat echocardiogram again Pacemaker present it is  a Medtronic device, I did review interrogation of the device 2.5 years left in the device Dyslipidemia: He is taking Crestor 20, I did review cholesterol done by primary care physician in June of this year showing HDL of 37, LDL 65.  We will continue present management Paroxysmal atrial fibrillation denies having a palpitation, he is anticoagulated.  Interrogation of the device did not show any evidence of A. fib. Potential need for left arm or shoulder surgery.  From my point of view should be no problem to proceed with surgery.  He did have quite extensive evaluation done at the beginning of this year, he does not have new symptomatology therefore if surgery is needed it can be performed.  Obviously anticoagulation to be held for 48 hours before surgery.   Medication Adjustments/Labs and Tests Ordered: Current medicines are reviewed at length with the patient today.  Concerns regarding medicines are outlined above.  No orders of the defined types were placed in this encounter.  Medication changes: No orders of the defined types were placed in this encounter.   Signed, Park Liter, MD, [facility] 12/10/2020 10:32 AM    [place]

## 2020-12-19 ENCOUNTER — Other Ambulatory Visit: Payer: Self-pay | Admitting: Cardiology

## 2020-12-19 NOTE — Telephone Encounter (Signed)
Prescription refill request for Xarelto received.  Indication:atrial fib Last office visit:7/22 Weight:116.1 kg Age:80 Scr:0.9 CrCl:109.29 ml/min  Prescription refilled

## 2020-12-31 DIAGNOSIS — S42292S Other displaced fracture of upper end of left humerus, sequela: Secondary | ICD-10-CM | POA: Insufficient documentation

## 2020-12-31 NOTE — Progress Notes (Signed)
Remote pacemaker transmission.   

## 2021-01-14 ENCOUNTER — Other Ambulatory Visit: Payer: Self-pay | Admitting: Cardiology

## 2021-02-09 ENCOUNTER — Telehealth: Payer: Self-pay

## 2021-02-09 NOTE — Telephone Encounter (Signed)
Patient with diagnosis of atrial fibrillation on Xarelto for anticoagulation.    Procedure: EGD Date of procedure: 03/06/2021   CHA2DS2-VASc Score = 5   This indicates a 7.2% annual risk of stroke. The patient's score is based upon: CHF History: 1 HTN History: 1 Diabetes History: 0 Stroke History: 0 Vascular Disease History: 1 Age Score: 2 Gender Score: 0   CrCl 85 (with adjusted body weight) Platelet count 121  Per office protocol, patient can hold Xarelto for 2 days prior to procedure.   Patient will not need bridging with Lovenox (enoxaparin) around procedure.  Patient should restart Xarelto on the evening of procedure or day after, at discretion of procedure MD

## 2021-02-09 NOTE — Telephone Encounter (Signed)
   [facility] Pre-operative Risk Assessment    Request for surgical clearance:  What type of surgery is being performed? EGD    When is this surgery scheduled? 03/06/2021   What type of clearance is required (medical clearance vs. Pharmacy clearance to hold med vs. Both? Pharmacy   Are there any medications that need to be held prior to surgery and how long?Xarelto (Holding length not specified)  Practice name and name of physician performing surgery? Dr. Kyra Leyland at [facility]   What is your office phone number: [phone]    7.   What is your office fax number; [phone]  8.   Anesthesia type (None, local, MAC, general) ? Not specified    Basil Dess Wayne Brunker 02/09/2021, 12:06 PM  _________________________________________________________________   (provider comments below)

## 2021-02-10 NOTE — Telephone Encounter (Addendum)
   Name: Frederick Vasquez  DOB: 09-29-1940  MRN: [id number]   Primary Cardiologist: [name], MD  Chart reviewed as part of pre-operative protocol coverage. Patient was contacted 02/10/2021 in reference to pre-operative risk assessment for pending surgery as outlined below.  Frederick Vasquez was last seen 12/10/20 by Dr. Agustin Cree. I reached out to patient for update on how he is doing. The patient affirms he has been doing well without any new cardiac symptoms. He has chronic unchanged dyspnea. Therefore, based on ACC/AHA guidelines, the patient would be at acceptable risk for the planned procedure without further cardiovascular testing. The patient was advised that if he develops new symptoms prior to surgery to contact our office to arrange for a follow-up visit, and he verbalized understanding.  PharmD reviewed anticoagulation and states "Per office protocol, patient can hold Xarelto for 2 days prior to procedure.   Patient will not need bridging with Lovenox (enoxaparin) around procedure. Patient should restart Xarelto on the evening of procedure or day after, at discretion of procedure MD"  Patient also has pacemaker so will route to device team in case they need to be aware of upcoming procedure.  I will route this recommendation to the requesting party via Epic fax function and remove from pre-op pool. Please call with questions.  Charlie Pitter, PA-C 02/10/2021, 4:36 PM

## 2021-02-11 ENCOUNTER — Encounter: Payer: Self-pay | Admitting: Cardiology

## 2021-02-11 NOTE — Progress Notes (Signed)
PERIOPERATIVE PRESCRIPTION FOR IMPLANTED CARDIAC DEVICE PROGRAMMING   Patient Information:  Patient: Frederick Vasquez  MRN: [id number]  Date of Birth: 10-Oct-1940      Planned Procedure:  EGD  Surgeon:  Dr. Kyra Leyland at [facility]  Date of Procedure:  03/06/2021   Device Information:   Clinic EP Physician:   Dr. Allegra Lai Device Type:  Pacemaker Manufacturer and Phone #:  Medtronic: [id number] Pacemaker Dependent?:  Yes Date of Last Device Check:  12/08/20 (Remote)         Normal Device Function?:  Yes     Electrophysiologist's Recommendations:   Have magnet available. Provide continuous ECG monitoring when magnet is used or reprogramming is to be performed.  Procedure may interfere with device function.  Magnet should be placed over device during procedure.  Per Device Clinic Standing Orders, Simone Curia  02/11/2021 5:39 AM

## 2021-03-09 ENCOUNTER — Ambulatory Visit (INDEPENDENT_AMBULATORY_CARE_PROVIDER_SITE_OTHER): Payer: Medicare Other

## 2021-03-09 DIAGNOSIS — I442 Atrioventricular block, complete: Secondary | ICD-10-CM

## 2021-03-10 DIAGNOSIS — D126 Benign neoplasm of colon, unspecified: Secondary | ICD-10-CM | POA: Insufficient documentation

## 2021-03-11 LAB — CUP PACEART REMOTE DEVICE CHECK
Battery Impedance: 1633 Ohm
Battery Remaining Longevity: 41 mo
Battery Voltage: 2.76 V
Brady Statistic AP VP Percent: 40 %
Brady Statistic AP VS Percent: 0 %
Brady Statistic AS VP Percent: 60 %
Brady Statistic AS VS Percent: 0 %
Date Time Interrogation Session: 20221017095906
Implantable Lead Implant Date: 20131113
Implantable Lead Implant Date: 20131113
Implantable Lead Location: 753859
Implantable Lead Location: 753860
Implantable Lead Model: 5076
Implantable Lead Model: 5092
Implantable Pulse Generator Implant Date: 20131113
Lead Channel Impedance Value: 399 Ohm
Lead Channel Impedance Value: 707 Ohm
Lead Channel Pacing Threshold Amplitude: 0.5 V
Lead Channel Pacing Threshold Amplitude: 0.75 V
Lead Channel Pacing Threshold Pulse Width: 0.4 ms
Lead Channel Pacing Threshold Pulse Width: 0.4 ms
Lead Channel Setting Pacing Amplitude: 2 V
Lead Channel Setting Pacing Amplitude: 2.5 V
Lead Channel Setting Pacing Pulse Width: 0.4 ms
Lead Channel Setting Sensing Sensitivity: 4 mV

## 2021-03-18 NOTE — Progress Notes (Signed)
Remote pacemaker transmission.   

## 2021-05-01 DIAGNOSIS — L309 Dermatitis, unspecified: Secondary | ICD-10-CM | POA: Insufficient documentation

## 2021-06-08 ENCOUNTER — Ambulatory Visit (INDEPENDENT_AMBULATORY_CARE_PROVIDER_SITE_OTHER): Payer: Medicare Other

## 2021-06-08 DIAGNOSIS — I442 Atrioventricular block, complete: Secondary | ICD-10-CM | POA: Diagnosis not present

## 2021-06-09 LAB — CUP PACEART REMOTE DEVICE CHECK
Battery Impedance: 1694 Ohm
Battery Remaining Longevity: 38 mo
Battery Voltage: 2.76 V
Brady Statistic AP VP Percent: 42 %
Brady Statistic AP VS Percent: 0 %
Brady Statistic AS VP Percent: 58 %
Brady Statistic AS VS Percent: 0 %
Date Time Interrogation Session: 20230116081555
Implantable Lead Implant Date: 20131113
Implantable Lead Implant Date: 20131113
Implantable Lead Location: 753859
Implantable Lead Location: 753860
Implantable Lead Model: 5076
Implantable Lead Model: 5092
Implantable Pulse Generator Implant Date: 20131113
Lead Channel Impedance Value: 399 Ohm
Lead Channel Impedance Value: 633 Ohm
Lead Channel Pacing Threshold Amplitude: 0.5 V
Lead Channel Pacing Threshold Amplitude: 0.625 V
Lead Channel Pacing Threshold Pulse Width: 0.4 ms
Lead Channel Pacing Threshold Pulse Width: 0.4 ms
Lead Channel Setting Pacing Amplitude: 2 V
Lead Channel Setting Pacing Amplitude: 2.5 V
Lead Channel Setting Pacing Pulse Width: 0.4 ms
Lead Channel Setting Sensing Sensitivity: 4 mV

## 2021-06-11 DIAGNOSIS — L931 Subacute cutaneous lupus erythematosus: Secondary | ICD-10-CM | POA: Insufficient documentation

## 2021-06-12 ENCOUNTER — Ambulatory Visit: Payer: Medicare Other | Admitting: Cardiology

## 2021-06-16 ENCOUNTER — Encounter: Payer: Self-pay | Admitting: Cardiology

## 2021-06-16 ENCOUNTER — Ambulatory Visit (INDEPENDENT_AMBULATORY_CARE_PROVIDER_SITE_OTHER): Payer: Medicare Other | Admitting: Cardiology

## 2021-06-16 ENCOUNTER — Other Ambulatory Visit: Payer: Self-pay

## 2021-06-16 VITALS — BP 156/80 | HR 80 | Ht 70.0 in | Wt 251.6 lb

## 2021-06-16 DIAGNOSIS — I480 Paroxysmal atrial fibrillation: Secondary | ICD-10-CM

## 2021-06-16 DIAGNOSIS — Z950 Presence of cardiac pacemaker: Secondary | ICD-10-CM

## 2021-06-16 DIAGNOSIS — I5032 Chronic diastolic (congestive) heart failure: Secondary | ICD-10-CM

## 2021-06-16 DIAGNOSIS — I10 Essential (primary) hypertension: Secondary | ICD-10-CM

## 2021-06-16 DIAGNOSIS — Z952 Presence of prosthetic heart valve: Secondary | ICD-10-CM | POA: Diagnosis not present

## 2021-06-16 MED ORDER — AMLODIPINE BESYLATE 5 MG PO TABS: 5.0000 mg | ORAL_TABLET | Freq: Every day | ORAL | 3 refills | Status: DC

## 2021-06-16 NOTE — Progress Notes (Signed)
Cardiology Office Note:    Date:  06/16/2021   ID:  Frederick Vasquez, DOB 06/17/1940, MRN [id number]  PCP:  Algis Greenhouse, MD  Cardiologist:  [name], MD    Referring MD: Algis Greenhouse, MD   Chief Complaint  Patient presents with   Follow-up    History of Present Illness:    Frederick Vasquez is a 81 y.o. male   with past medical history significant for severe arctic stenosis, status post TAVI done in 2019 with 26 mm Edwards SAPIEN S3 valve, paroxysmal atrial fibrillation, dual-chamber pacemaker, essential hypertension, dyslipidemia, chronic back problem.  I did see him last time in January at that time he was evaluated before back surgery he went to surgery with no difficulties evaluation included stress test which showed no evidence of ischemia as well as echocardiogram which revealed normal valve function.  He comes today to my office for regular follow-up.  He like always complaining of having shortness of breath but this is nothing new he did have cardiac  I think the extent of part comes today to my office for follow-up.  Overall he is doing well.  Denies have any chest pain tightness squeezing pressure burning chest no palpitations dizziness swelling of lower extremities.  Past Medical History:  Diagnosis Date   Acute prostatitis 06/06/2020   Formatting of this note might be different from the original. 06/06/2020   Aortic stenosis 05/15/2017   Severe by echo from October 2019  Formatting of this note might be different from the original. Formatting of this note might be different from the original. 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 Chest CT 01/18/2019 2019: USN exposure 2019: CXR: IMPRESSION: 1. 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 2. No active infiltrate or effusion. 2019:  PULM eval   Atrial fibrillation ([place])    Xarelto   Balanitis 04/10/2020   Formatting of this note might be different from the original. 04/10/2020   Chronic anticoagulation 06/14/2017   CHADS VASC=4   Chronic diastolic CHF (congestive heart failure) ([place]) 05/15/2017   Contact with and (suspected) exposure to asbestos 11/17/2017   Formatting of this note might be different from the original. 1961-64, [place] room with pipe insulation contact and movement 2018: pleural plaque on CT   Diarrhea of presumed infectious origin 09/25/2019   Formatting of this note might be different from the original. 7829   Diastolic heart failure ([place])    Dyslipidemia 11/19/2014   Dysrhythmia    A- Fib   Elevated troponin 05/15/2017   Encounter for administration of vaccine 11/19/2014   Essential hypertension 05/15/2017   Exacerbation of chronic bronchiolitis ([place]) 02/28/2020   Formatting of this note might be different from the original. 02/28/2020   Gastrointestinal hemorrhage 09/25/2019   Formatting of this note might be different from the original. 2021   Hemoptysis 05/15/2017   Formatting of this note might be different from the original. 2019   History of aortic valvular stenosis 02/26/2016   Last Assessment & Plan:  Formatting of this note might be different from the original. Moderate.   History of gout 08/27/2015   Hyperlipidemia    Hypertension    Hypothyroidism 09/13/2019   Formatting of this note might be different from the original. 2021: TSH 41  Impaired glucose tolerance    Lung nodule 01/25/2019   Formatting of this note is different from the original. Chest CT 01/18/2019 notes a stable 4 mm right middle lobe lung nodule- recheck chest CT August 2021   Lupus [facility])    Normal coronary arteries 06/14/2017   2014 cath in [facility]   Pacemaker    Paroxysmal atrial fibrillation ([place]) 11/19/2014   Xarelto Formatting of this note might be different from the original. Managed CARDS  Formatting of  this note might be different from the original. 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 05/2020: ORTHO, myelogram, surg rec   Suspected COVID-19 virus infection 09/25/2019   Formatting of this note might be different from the original. 2021   Thrombocytopenia ([place]) 05/15/2017   Type 2 diabetes mellitus without complication, without long-term current use of insulin ([place]) 08/27/2015   Formatting of this note might be different from the original. 2019: 99/5.3 2020: 125/5.7 2021: 126/6.6    Past Surgical History:  Procedure Laterality Date   CARDIAC CATHETERIZATION  2014   non-obs dz, done at [place] N/A 09/02/2017   Procedure: CARDIOVERSION;  Surgeon: Lelon Perla, MD;  Location: [place];  Service: Cardiovascular;  Laterality: N/A;   COLONOSCOPY     EYE SURGERY     bilateral cataracts   PACEMAKER IMPLANT  03/2012   REPLACEMENT TOTAL KNEE Left    RIGHT/LEFT HEART CATH AND CORONARY ANGIOGRAPHY N/A 03/28/2018   Procedure: RIGHT/LEFT HEART CATH AND CORONARY ANGIOGRAPHY;  Surgeon: Belva Crome, MD;  Location: [place] CV LAB;  Service: Cardiovascular;  Laterality: N/A;   TEE WITHOUT CARDIOVERSION N/A 05/12/2018   Procedure: TRANSESOPHAGEAL ECHOCARDIOGRAM (TEE);  Surgeon: Sherren Mocha, MD;   Location: [place];  Service: Open Heart Surgery;  Laterality: N/A;   TRANSCATHETER AORTIC VALVE REPLACEMENT, TRANSFEMORAL N/A 05/12/2018   Procedure: TRANSCATHETER AORTIC VALVE REPLACEMENT, TRANSFEMORAL;  Surgeon: Sherren Mocha, MD;  Location: [place];  Service: Open Heart Surgery;  Laterality: N/A;   VASECTOMY      Current Medications: Current Meds  Medication Sig   acetaminophen (TYLENOL) 500 MG tablet Take 1,000 mg by mouth every 6 (six) hours as needed for moderate pain or headache.   hydroxychloroquine (PLAQUENIL) 200 MG tablet Take 200 mg by mouth in the morning and at bedtime.   metoprolol tartrate (LOPRESSOR) 50 MG tablet TAKE 1 TABLET(50 MG) BY MOUTH TWICE DAILY   nitroGLYCERIN (NITROSTAT) 0.4 MG SL tablet Place 0.4 mg under the tongue every 5 (five) minutes as needed for chest pain.   pantoprazole (PROTONIX) 40 MG tablet Take 1 tablet by mouth daily.   rosuvastatin (CRESTOR) 40 MG tablet Take 40 mg by mouth daily.   XARELTO 20 MG TABS tablet TAKE 1 TABLET(20 MG) BY MOUTH DAILY WITH SUPPER (Patient taking differently: Take 20 mg by  mouth daily with supper.)     Allergies:   Sulfamethoxazole-trimethoprim, Allopurinol, and Atorvastatin   Social History   Socioeconomic History   Marital status: Married    Spouse name: Not on file   Number of children: Not on file   Years of education: Not on file   Highest education level: Not on file  Occupational History   Occupation: retired  Tobacco Use   Smoking status: Former    Types: Cigarettes    Quit date: 1978    Years since quitting: 45.0   Smokeless tobacco: Never  Vaping Use   Vaping Use: Never used  Substance and Sexual Activity   Alcohol use: No   Drug use: No   Sexual activity: Not on file  Other Topics Concern   Not on file  Social History Narrative   Pt lives in [place] Determinants of Health   Financial Resource Strain: Not on file  Food Insecurity: Not on file  Transportation Needs: Not on file   Physical Activity: Not on file  Stress: Not on file  Social Connections: Not on file     Family History: The patient's family history includes CAD in his father; Colon cancer in his sister; Stroke in his father. ROS:   Please see the history of present illness.    All 14 point review of systems negative except as described per history of present illness  EKGs/Labs/Other Studies Reviewed:      Recent Labs: No results found for requested labs within last 8760 hours.  Recent Lipid Panel No results found for: CHOL, TRIG, HDL, CHOLHDL, VLDL, LDLCALC, LDLDIRECT  Physical Exam:    VS:  BP (!) 156/80 (BP Location: Right Arm, Patient Position: Sitting)    Pulse 80    Ht 5\' 10"  (1.778 m)    Wt 251 lb 9.6 oz (114.1 kg)    SpO2 96%    BMI 36.10 kg/m     Wt Readings from Last 3 Encounters:  06/16/21 251 lb 9.6 oz (114.1 kg)  12/10/20 256 lb (116.1 kg)  06/25/20 252 lb (114.3 kg)     GEN:  Well nourished, well developed in no acute distress HEENT: Normal NECK: No JVD; No carotid bruits LYMPHATICS: No lymphadenopathy CARDIAC: RRR, no murmurs, no rubs, no gallops RESPIRATORY:  Clear to auscultation without rales, wheezing or rhonchi  ABDOMEN: Soft, non-tender, non-distended MUSCULOSKELETAL:  No edema; No deformity  SKIN: Warm and dry LOWER EXTREMITIES: no swelling NEUROLOGIC:  Alert and oriented x 3 PSYCHIATRIC:  Normal affect   ASSESSMENT:    1. S/P TAVR (transcatheter aortic valve replacement)   2. Presence of cardiac pacemaker   3. Essential hypertension   4. Chronic diastolic CHF (congestive heart failure) (HCC)   5. Paroxysmal atrial fibrillation (HCC)    PLAN:    In order of problems listed above:  Status post TAVI.  I will ask him to have echocardiogram which we will do. Pacemaker present I did review interrogation 38 months left in the device normal function continue present management Essential hypertension still uncontrolled.  I will put him on 5 mg of amlodipine  I warned him of potential side effect including swelling of lower extremities Chronic diastolic congestive heart failure stable.  Compensated continue present management Paroxysmal atrial fibrillation anticoagulated denies have any palpitations.   Medication Adjustments/Labs and Tests Ordered: Current medicines are reviewed at length with the patient today.  Concerns regarding medicines are outlined above.  No orders of the defined  types were placed in this encounter.  Medication changes: No orders of the defined types were placed in this encounter.   Signed, Park Liter, MD, [facility] 06/16/2021 2:26 PM    [place]

## 2021-06-16 NOTE — Patient Instructions (Signed)
Medication Instructions:  Your physician has recommended you make the following change in your medication:   START: Amlodipine 5 mg daily  *If you need a refill on your cardiac medications before your next appointment, please call your pharmacy*   Lab Work: None If you have labs (blood work) drawn today and your tests are completely normal, you will receive your results only by: [place] (if you have MyChart) OR A paper copy in the mail If you have any lab test that is abnormal or we need to change your treatment, we will call you to review the results.   Testing/Procedures: Your physician has requested that you have an echocardiogram. Echocardiography is a painless test that uses sound waves to create images of your heart. It provides your doctor with information about the size and shape of your heart and how well your hearts chambers and valves are working. This procedure takes approximately one hour. There are no restrictions for this procedure.    Follow-Up: At [facility], you and your health needs are our priority.  As part of our continuing mission to provide you with exceptional heart care, we have created designated Provider Care Teams.  These Care Teams include your primary Cardiologist (physician) and Advanced Practice Providers (APPs -  Physician Assistants and Nurse Practitioners) who all work together to provide you with the care you need, when you need it.  We recommend signing up for the patient portal called "MyChart".  Sign up information is provided on this After Visit Summary.  MyChart is used to connect with patients for Virtual Visits (Telemedicine).  Patients are able to view lab/test results, encounter notes, upcoming appointments, etc.  Non-urgent messages can be sent to your provider as well.   To learn more about what you can do with MyChart, go to [email or web address].    Your next appointment:   6 month(s)  The format for your next appointment:    In Person  Provider:   [name], MD    Other Instructions None

## 2021-06-17 ENCOUNTER — Ambulatory Visit (INDEPENDENT_AMBULATORY_CARE_PROVIDER_SITE_OTHER): Payer: Medicare Other

## 2021-06-17 DIAGNOSIS — I5032 Chronic diastolic (congestive) heart failure: Secondary | ICD-10-CM

## 2021-06-17 DIAGNOSIS — I10 Essential (primary) hypertension: Secondary | ICD-10-CM

## 2021-06-17 DIAGNOSIS — I480 Paroxysmal atrial fibrillation: Secondary | ICD-10-CM

## 2021-06-17 DIAGNOSIS — Z952 Presence of prosthetic heart valve: Secondary | ICD-10-CM | POA: Diagnosis not present

## 2021-06-17 DIAGNOSIS — Z950 Presence of cardiac pacemaker: Secondary | ICD-10-CM

## 2021-06-17 LAB — ECHOCARDIOGRAM COMPLETE
AR max vel: 1.08 cm2
AV Area VTI: 0.92 cm2
AV Area mean vel: 1 cm2
AV Mean grad: 19 mmHg
AV Peak grad: 33.9 mmHg
Ao pk vel: 2.91 m/s
Area-P 1/2: 2.15 cm2
MV VTI: 1.01 cm2
S' Lateral: 3.4 cm

## 2021-06-19 NOTE — Progress Notes (Signed)
Remote pacemaker transmission.   

## 2021-06-24 ENCOUNTER — Telehealth: Payer: Self-pay

## 2021-06-24 NOTE — Telephone Encounter (Signed)
-----   Message from Park Liter, MD sent at 06/19/2021 10:34 AM EST ----- Echocardiogram showed preserved left ventricle ejection fraction, mild mitral valve regurgitation mild mitral valve stenosis prosthetic aortic valve functioning properly

## 2021-06-24 NOTE — Telephone Encounter (Signed)
Patient notified of test results 

## 2021-08-14 ENCOUNTER — Other Ambulatory Visit: Payer: Self-pay | Admitting: Cardiology

## 2021-09-07 ENCOUNTER — Ambulatory Visit (INDEPENDENT_AMBULATORY_CARE_PROVIDER_SITE_OTHER): Payer: Medicare Other

## 2021-09-07 DIAGNOSIS — I442 Atrioventricular block, complete: Secondary | ICD-10-CM | POA: Diagnosis not present

## 2021-09-08 LAB — CUP PACEART REMOTE DEVICE CHECK
Battery Impedance: 1804 Ohm
Battery Remaining Longevity: 36 mo
Battery Voltage: 2.76 V
Brady Statistic AP VP Percent: 41 %
Brady Statistic AP VS Percent: 0 %
Brady Statistic AS VP Percent: 59 %
Brady Statistic AS VS Percent: 0 %
Date Time Interrogation Session: 20230417090331
Implantable Lead Implant Date: 20131113
Implantable Lead Implant Date: 20131113
Implantable Lead Location: 753859
Implantable Lead Location: 753860
Implantable Lead Model: 5076
Implantable Lead Model: 5092
Implantable Pulse Generator Implant Date: 20131113
Lead Channel Impedance Value: 360 Ohm
Lead Channel Impedance Value: 631 Ohm
Lead Channel Pacing Threshold Amplitude: 0.5 V
Lead Channel Pacing Threshold Amplitude: 0.625 V
Lead Channel Pacing Threshold Pulse Width: 0.4 ms
Lead Channel Pacing Threshold Pulse Width: 0.4 ms
Lead Channel Setting Pacing Amplitude: 2 V
Lead Channel Setting Pacing Amplitude: 2.5 V
Lead Channel Setting Pacing Pulse Width: 0.4 ms
Lead Channel Setting Sensing Sensitivity: 4 mV

## 2021-09-09 ENCOUNTER — Telehealth: Payer: Self-pay

## 2021-09-09 NOTE — Telephone Encounter (Signed)
Scheduled remote reviewed. Normal device function.   ?Presenting rhythm AF, rates controlled, appears ongoing from 3/30 ?Known PAF Xarelto '20mg'$ , Metoprolol burden 3.4% ?Next remote 91 days. ?Route to triage per protocol ?LA ? ?Unsuccessful telephone encounter to patient to assess for s/s of ongoing AF (V rates controlled) and to assess for compliance with [place] and [place]. [employer] compliant VM message left requesting call back to [phone]. ? ? ? ? ? ?

## 2021-09-10 NOTE — Telephone Encounter (Signed)
Spoke with patients wife she stated she had taken him to [facility] because he was dizzy and fell, she stated she had come back home to turn the stove off and she was going back to the hospital informed her to let the hospital staff know that his cardiology office had called said he had been in atrial fibrillation, patients wife voiced understanding ?

## 2021-09-11 ENCOUNTER — Telehealth: Payer: Self-pay | Admitting: Cardiology

## 2021-09-11 ENCOUNTER — Other Ambulatory Visit: Payer: Self-pay

## 2021-09-11 NOTE — Telephone Encounter (Signed)
Called patient and spoke to the patient's wife regarding his symptoms.The patients wife reported that he is not experiencing any chest pain or shortness of breath at this time. I spoke to Dr. Agustin Cree about this patient and after reviewing his chart he did not have any new orders and just wanted him to keep their next follow up appointment. ?

## 2021-09-11 NOTE — Telephone Encounter (Signed)
Patient c/o Palpitations:  High priority if patient c/o lightheadedness, shortness of breath, or chest pain ? ?How long have you had palpitations/irregular HR/ Afib? Are you having the symptoms now? Since Monday ? ?Are you currently experiencing lightheadedness, SOB or CP? Lightheadedness, dizzy (fell in drive way) ? ?Do you have a history of afib (atrial fibrillation) or irregular heart rhythm? Yes  ? ?Have you checked your BP or HR? (document readings if available): 150/70 range; 60 ? ?Are you experiencing any other symptoms? Slight headache  ?

## 2021-09-11 NOTE — Telephone Encounter (Signed)
Wife stated that device clinic called yesterday after reading pt's pacemaker and stated that pt was back in Afib. Wife states that pt is doing well today but wanted to make Dr. Raliegh Ip aware. Please advise ?

## 2021-09-14 DIAGNOSIS — S41112A Laceration without foreign body of left upper arm, initial encounter: Secondary | ICD-10-CM | POA: Insufficient documentation

## 2021-09-18 ENCOUNTER — Telehealth: Payer: Self-pay

## 2021-09-18 ENCOUNTER — Telehealth: Payer: Self-pay | Admitting: Cardiology

## 2021-09-18 NOTE — Telephone Encounter (Signed)
Pt called in to report currently having chest tightness to center of chest, nausea and feelings that could fall out.  RN immediately advised pt to report to the ED.  Pt refuses reports needs to be seen by Dr. Agustin Cree.  RN then asked pt if has taken any NTG pt reports has not.  Advised pt to take NTG; pt again reports just needs to be seen by Dr. Agustin Cree.  Advised pt again that active CP warrants an ED visit.  Pt reports fell last week and went to [facility] had an MRI done.  RN advised pt that symptoms are different and ED visit is needed. Pt refused expresses needs to be seen by Dr. Agustin Cree. Pt upset said will take a NTG and hung up.   Will request Device clinic run a transmission on pt.  Route to Dr. Agustin Cree ?

## 2021-09-18 NOTE — Telephone Encounter (Signed)
Remote Transmission received 09/18/21. ? ?Normal device function . ? ?Of note patient has been in atrial fibrillation since 08/20/21 + Xarelto with VP rates.  ? ? ? ? ? ?

## 2021-09-18 NOTE — Telephone Encounter (Signed)
Pt c/o Shortness Of Breath: STAT if SOB developed within the last 24 hours or pt is noticeably SOB on the phone ? ?1. Are you currently SOB (can you hear that pt is SOB on the phone)? Yes ? ?2. How long have you been experiencing SOB?  ?Month, today is worse than usual ?3. Are you SOB when sitting or when up moving around? moving ? ?4. Are you currently experiencing any other symptoms? Pt states that he has chest tightening as well ? ?

## 2021-09-18 NOTE — Telephone Encounter (Signed)
Called pt back regarding chest pain, SOB. No answer. LM with nurse name and number to call back. ?

## 2021-09-18 NOTE — Telephone Encounter (Signed)
Spoke with patient, and patients wife on speaker phone. patient stated he took 1 nitroglycerin and his CP had eased off but has a headache. Asked patient to send a remote transmission from his pacemaker and if he did not hear anything from that means his pacemaker is functioning normal. Stressed importance to patient that if the chest pains come back he needs to go to the ER as it could be life threatening. Patient and patients wife voiced understanding.  ?

## 2021-09-22 NOTE — Telephone Encounter (Signed)
Attempted to contact patient to advised AF clinic f/u per Dr. Curt Bears. No answer, LMTCB. ?

## 2021-09-22 NOTE — Telephone Encounter (Signed)
Called pt to schedule for clinic appt per Dr. Curt Bears. Appt. Friday Sep 25, 2021. Pt notified. ?

## 2021-09-24 NOTE — Progress Notes (Signed)
Remote pacemaker transmission.   

## 2021-09-25 ENCOUNTER — Encounter: Payer: Self-pay | Admitting: Cardiology

## 2021-09-25 ENCOUNTER — Ambulatory Visit (INDEPENDENT_AMBULATORY_CARE_PROVIDER_SITE_OTHER): Payer: Medicare Other | Admitting: Cardiology

## 2021-09-25 VITALS — BP 140/80 | HR 70 | Ht 70.0 in | Wt 252.0 lb

## 2021-09-25 DIAGNOSIS — Z952 Presence of prosthetic heart valve: Secondary | ICD-10-CM

## 2021-09-25 DIAGNOSIS — E785 Hyperlipidemia, unspecified: Secondary | ICD-10-CM | POA: Diagnosis not present

## 2021-09-25 DIAGNOSIS — Z950 Presence of cardiac pacemaker: Secondary | ICD-10-CM

## 2021-09-25 DIAGNOSIS — I480 Paroxysmal atrial fibrillation: Secondary | ICD-10-CM

## 2021-09-25 DIAGNOSIS — Z7901 Long term (current) use of anticoagulants: Secondary | ICD-10-CM

## 2021-09-25 DIAGNOSIS — D696 Thrombocytopenia, unspecified: Secondary | ICD-10-CM

## 2021-09-25 NOTE — Progress Notes (Signed)
?Cardiology Office Note:   ? ?Date:  09/25/2021  ? ?ID:  WES LEZOTTE, DOB 20-Sep-1940, MRN [id number] ? ?PCP:  Algis Greenhouse, MD  ?Cardiologist:  [name], MD   ? ?Referring MD: Algis Greenhouse, MD  ? ?Chief Complaint  ?Patient presents with  ? Fall  ? ? ?History of Present Illness:   ? ?Frederick Vasquez is a 81 y.o. male with past medical history significant for severe aortic stenosis, status post TAVI done in 2019 with 26 [place] 3 valve, paroxysmal atrial fibrillation, dual-chamber pacemaker, essential hypertension, dyslipidemia, chronic back problem.  Recently he started having difficulty difficulty with balance and some falls.  He also was noted to have atrial fibrillation on the pacemaker interrogation.  Also complained of having some atypical chest pain, end up going to the emergency room is his visit in the emergency room was unrevealing.  No evidence of MI he was however noted to them to be in atrial fibrillation.  Overall complain of being weak tired and exhausted.  He said when he went shopping he have to call to shopping cart because of unsteadiness. ? ?Past Medical History:  ?Diagnosis Date  ? Acute prostatitis 06/06/2020  ? Formatting of this note might be different from the original. 06/06/2020  ? Aortic stenosis 05/15/2017  ? Severe by echo from October 2019  Formatting of this note might be different from the original. Formatting of this note might be different from the original. Severe by echo from October 2019  ? Arthritis   ? bilateral hands and knees  ? 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 Formatting of this note might be different from the original. Chest CT 01/18/2019 2019: USN exposure 2019: CXR: IMPRESSION: 1. 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 2. No active infiltrate or effusion. 2019: PULM eval  ? Atrial fibrillation ([place])   ? Xarelto  ? Balanitis 04/10/2020  ? Formatting of this note might be different from the  original. 04/10/2020  ? Chronic anticoagulation 06/14/2017  ? CHADS VASC=4  ? Chronic diastolic CHF (congestive heart failure) ([place]) 05/15/2017  ? Contact with and (suspected) exposure to asbestos 11/17/2017  ? Formatting of this note might be different from the original. 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 Diarrhea of presumed infectious origin 09/25/2019  ? Formatting of this note might be different from the original. 2021  ? Diastolic heart failure ([place])   ? Dyslipidemia 11/19/2014  ? Dysrhythmia   ? A- Fib  ? Elevated troponin 05/15/2017  ? Encounter for administration of vaccine 11/19/2014  ? Essential hypertension 05/15/2017  ? Exacerbation of chronic bronchiolitis ([place]) 02/28/2020  ? Formatting of this note might be different from the original. 02/28/2020  ? Gastrointestinal hemorrhage 09/25/2019  ? Formatting of this note might be different from the original. 2021  ? Hemoptysis 05/15/2017  ? Formatting of this note might be different from the original. 2019  ? History of aortic valvular stenosis 02/26/2016  ? Last Assessment & Plan:  Formatting of this note might be different from the original. Moderate.  ? History of gout 08/27/2015  ? Hyperlipidemia   ? Hypertension   ? Hypothyroidism 09/13/2019  ? Formatting of this note might be different from the original. 2021: TSH 41  ? Impaired glucose tolerance   ? Lung nodule 01/25/2019  ? Formatting of this note is different from the original. Chest CT  01/18/2019 notes a stable 4 mm right middle lobe lung nodule- recheck chest CT August 2021  ? Lupus ([place])   ? Normal coronary arteries 06/14/2017  ? 2014 cath in [place]  ? Pacemaker   ? Paroxysmal atrial fibrillation ([place]) 11/19/2014  ? Xarelto Formatting of this note might be different from the original. Managed CARDS  Formatting of this note might be different from the original. As detected by pacemaker  ? Presence of cardiac pacemaker 11/19/2014  ?  Restrictive lung disease 01/25/2019  ? Formatting of this note might be different from the original. PFT 01/20/2019 ratio 81%, FEV1 59%, FVC 55%, DLCO 51%  ? 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 26 mm Edwards Sapien 3 transcatheter heart valve placed via percutaneous right transfemoral approach   ? Sepsis ([place]) 05/14/2017  ? Admitted with possible sepsis after an episode of epistaxis 05/15/17-tx'd from [facility] Blood cultures were negative, no TEE done  ? Severe malnutrition ([place]) 05/15/2017  ? Spinal stenosis of lumbar region with neurogenic claudication 06/15/2020  ? Formatting of this note might be different from the original. 05/2020: ORTHO, myelogram, surg rec  ? Suspected COVID-19 virus infection 09/25/2019  ? Formatting of this note might be different from the original. 2021  ? Thrombocytopenia ([place]) 05/15/2017  ? Type 2 diabetes mellitus without complication, without long-term current use of insulin ([place]) 08/27/2015  ? Formatting of this note might be different from the original. 2019: 99/5.3 2020: 125/5.7 2021: 126/6.6  ? ? ?Past Surgical History:  ?Procedure Laterality Date  ? CARDIAC CATHETERIZATION  2014  ? non-obs dz, done at [facility]  ? CARDIOVERSION N/A 09/02/2017  ? Procedure: CARDIOVERSION;  Surgeon: Lelon Perla, MD;  Location: [facility] ENDOSCOPY;  Service: Cardiovascular;  Laterality: N/A;  ? COLONOSCOPY    ? EYE SURGERY    ? bilateral cataracts  ? PACEMAKER IMPLANT  03/2012  ? REPLACEMENT TOTAL KNEE Left   ? RIGHT/LEFT HEART CATH AND CORONARY ANGIOGRAPHY N/A 03/28/2018  ? Procedure: RIGHT/LEFT HEART CATH AND CORONARY ANGIOGRAPHY;  Surgeon: Belva Crome, MD;  Location: [place] CV LAB;  Service: Cardiovascular;  Laterality: N/A;  ? TEE WITHOUT CARDIOVERSION N/A 05/12/2018  ? Procedure: TRANSESOPHAGEAL ECHOCARDIOGRAM (TEE);  Surgeon: Sherren Mocha, MD;  Location: [place];  Service: Open Heart Surgery;  Laterality: N/A;  ? TRANSCATHETER AORTIC VALVE REPLACEMENT, TRANSFEMORAL  N/A 05/12/2018  ? Procedure: TRANSCATHETER AORTIC VALVE REPLACEMENT, TRANSFEMORAL;  Surgeon: Sherren Mocha, MD;  Location: [place];  Service: Open Heart Surgery;  Laterality: N/A;  ? VASECTOMY    ? ? ?Current Medications: ?Current Meds  ?Medication Sig  ? acetaminophen (TYLENOL) 500 MG tablet Take 1,000 mg by mouth every 6 (six) hours as needed for moderate pain or headache.  ? amLODipine (NORVASC) 5 MG tablet Take 1 tablet (5 mg total) by mouth daily.  ? hydroxychloroquine (PLAQUENIL) 200 MG tablet Take 200 mg by mouth in the morning and at bedtime.  ? metoprolol tartrate (LOPRESSOR) 50 MG tablet TAKE 1 TABLET(50 MG) BY MOUTH TWICE DAILY (Patient taking differently: Take 50 mg by mouth 2 (two) times daily.)  ? nitroGLYCERIN (NITROSTAT) 0.4 MG SL tablet Place 0.4 mg under the tongue every 5 (five) minutes as needed for chest pain.  ? pantoprazole (PROTONIX) 40 MG tablet Take 1 tablet by mouth daily.  ? rosuvastatin (CRESTOR) 40 MG tablet Take 40 mg by mouth daily.  ? XARELTO 20 MG TABS tablet TAKE 1 TABLET(20 MG) BY MOUTH DAILY WITH SUPPER (Patient  taking differently: Take 20 mg by mouth daily with supper.)  ?  ? ?Allergies:   Sulfamethoxazole-trimethoprim, Allopurinol, and Atorvastatin  ? ?Social History  ? ?Socioeconomic History  ? Marital status: Married  ?  Spouse name: Not on file  ? Number of children: Not on file  ? Years of education: Not on file  ? Highest education level: Not on file  ?Occupational History  ? Occupation: retired  ?Tobacco Use  ? Smoking status: Former  ?  Types: Cigarettes  ?  Quit date: 53  ?  Years since quitting: 45.3  ? Smokeless tobacco: Never  ?Vaping Use  ? Vaping Use: Never used  ?Substance and Sexual Activity  ? Alcohol use: No  ? Drug use: No  ? Sexual activity: Not on file  ?Other Topics Concern  ? Not on file  ?Social History Narrative  ? Pt lives in [place]  ? ?Social Determinants of Health  ? ?Financial Resource Strain: Not on file  ?Food Insecurity: Not on file   ?Transportation Needs: Not on file  ?Physical Activity: Not on file  ?Stress: Not on file  ?Social Connections: Not on file  ?  ? ?Family History: ?The patient's family history includes CAD in his father; Colon cancer in

## 2021-09-25 NOTE — Patient Instructions (Signed)
Medication Instructions:  Your physician recommends that you continue on your current medications as directed. Please refer to the Current Medication list given to you today.  *If you need a refill on your cardiac medications before your next appointment, please call your pharmacy*   Lab Work: None Ordered If you have labs (blood work) drawn today and your tests are completely normal, you will receive your results only by: MyChart Message (if you have MyChart) OR A paper copy in the mail If you have any lab test that is abnormal or we need to change your treatment, we will call you to review the results.   Testing/Procedures: None Ordered   Follow-Up: At [facility], you and your health needs are our priority.  As part of our continuing mission to provide you with exceptional heart care, we have created designated Provider Care Teams.  These Care Teams include your primary Cardiologist (physician) and Advanced Practice Providers (APPs -  Physician Assistants and Nurse Practitioners) who all work together to provide you with the care you need, when you need it.  We recommend signing up for the patient portal called "MyChart".  Sign up information is provided on this After Visit Summary.  MyChart is used to connect with patients for Virtual Visits (Telemedicine).  Patients are able to view lab/test results, encounter notes, upcoming appointments, etc.  Non-urgent messages can be sent to your provider as well.   To learn more about what you can do with MyChart, go to [email or web address].    Your next appointment:   1 month(s)  The format for your next appointment:   In Person  Provider:   Robert Krasowski, MD    Other Instructions NA  

## 2021-09-30 ENCOUNTER — Telehealth: Payer: Self-pay

## 2021-09-30 DIAGNOSIS — I480 Paroxysmal atrial fibrillation: Secondary | ICD-10-CM

## 2021-09-30 NOTE — Telephone Encounter (Signed)
Patient aware will be contact for an appt with Dr. Baird Kay ?

## 2021-09-30 NOTE — Telephone Encounter (Signed)
-----   Message from Park Liter, MD sent at 09/30/2021  2:00 PM EDT ----- ?Please refer him to atrial fibrillation clinic to talk about Tikosyn load ?----- Message ----- ?From: Constance Haw, MD ?Sent: 09/25/2021  12:31 PM EDT ?To: Park Liter, MD ? ?Tikosyn certainly seems reasonable. Would have him follow up in the AF clinic to discuss and find a good antiarrhythmic option but Phyllis Ginger is a good one. ?----- Message ----- ?From: Park Liter, MD ?Sent: 09/25/2021  10:28 AM EDT ?To: Will Meredith Leeds, MD ? ?Hi Will, I did see Mr. Massie today I you know he is a gentleman with dual-chamber pacemaker paroxysmal atrial fibrillation, status post TAVI done years ago he started feeling poorly within the last few weeks look like this is feeling poorly is correlating to his atrial fibrillation.  Look like now he is in persistent atrial fibrillation.  I think we reached the point of antiarrhythmic medication will be needed we did try amiodarone before however that led to thyroid problem as well as liver dysfunction, therefore, I do not think amiodarone is the best option for him could you please advise me what will be the best medication we can try for him.  He does have preserved left ventricle ejection fraction but I think we may be reaching the point that we should start thinking about dofetilide.  Please let me know what you feel my idea is to put him on antiarrhythmic medications for few weeks and then convert him to sinus rhythm ?Thanks like always for your expertise  ?Herbie [place] ? ? ? ?

## 2021-10-08 ENCOUNTER — Ambulatory Visit (HOSPITAL_COMMUNITY)
Admission: RE | Admit: 2021-10-08 | Discharge: 2021-10-08 | Disposition: A | Discharge status: Home / Self Care | Payer: Medicare Other | Source: Ambulatory Visit | Attending: Nurse Practitioner | Admitting: Nurse Practitioner

## 2021-10-08 ENCOUNTER — Other Ambulatory Visit (HOSPITAL_COMMUNITY): Payer: Self-pay | Admitting: *Deleted

## 2021-10-08 ENCOUNTER — Encounter (HOSPITAL_COMMUNITY): Payer: Self-pay | Admitting: Nurse Practitioner

## 2021-10-08 ENCOUNTER — Telehealth: Payer: Self-pay | Admitting: Pharmacist

## 2021-10-08 VITALS — BP 128/62 | HR 60 | Ht 70.0 in | Wt 247.0 lb

## 2021-10-08 DIAGNOSIS — D6869 Other thrombophilia: Secondary | ICD-10-CM | POA: Diagnosis not present

## 2021-10-08 DIAGNOSIS — I10 Essential (primary) hypertension: Secondary | ICD-10-CM | POA: Insufficient documentation

## 2021-10-08 DIAGNOSIS — E785 Hyperlipidemia, unspecified: Secondary | ICD-10-CM | POA: Diagnosis not present

## 2021-10-08 DIAGNOSIS — I350 Nonrheumatic aortic (valve) stenosis: Secondary | ICD-10-CM | POA: Diagnosis not present

## 2021-10-08 DIAGNOSIS — I4819 Other persistent atrial fibrillation: Secondary | ICD-10-CM | POA: Diagnosis present

## 2021-10-08 DIAGNOSIS — Z952 Presence of prosthetic heart valve: Secondary | ICD-10-CM | POA: Insufficient documentation

## 2021-10-08 DIAGNOSIS — I480 Paroxysmal atrial fibrillation: Secondary | ICD-10-CM

## 2021-10-08 DIAGNOSIS — Z7901 Long term (current) use of anticoagulants: Secondary | ICD-10-CM | POA: Diagnosis not present

## 2021-10-08 LAB — BASIC METABOLIC PANEL
Anion gap: 9 (ref 5–15)
BUN: 12 mg/dL (ref 8–23)
CO2: 21 mmol/L — ABNORMAL LOW (ref 22–32)
Calcium: 9.1 mg/dL (ref 8.9–10.3)
Chloride: 107 mmol/L (ref 98–111)
Creatinine, Ser: 0.83 mg/dL (ref 0.61–1.24)
GFR, Estimated: >60 mL/min (ref >60)
Glucose, Bld: 118 mg/dL — ABNORMAL HIGH (ref 70–99)
Potassium: 3.7 mmol/L (ref 3.5–5.1)
Sodium: 137 mmol/L (ref 135–145)

## 2021-10-08 LAB — MAGNESIUM: Magnesium: 2 mg/dL (ref 1.7–2.4)

## 2021-10-08 MED ORDER — POTASSIUM CHLORIDE CRYS ER 20 MEQ PO TBCR: 20.0000 meq | EXTENDED_RELEASE_TABLET | Freq: Every day | ORAL | 3 refills | Status: DC

## 2021-10-08 NOTE — Progress Notes (Signed)
Primary Care Physician: Algis Greenhouse, MD Referring Physician: Dr. Agustin Cree  EP: Dr. Raynald Blend Frederick Vasquez is a 81 y.o. male with a h/o  severe aortic stenosis, status post TAVR done in 2019 with 26 [place] 3 valve, paroxysmal atrial fibrillation, dual-chamber pacemaker, essential hypertension, dyslipidemia, chronic back problems.  Recently he started having  difficulty with balance and some falls.  He also was noted to have atrial fibrillation on the pacemaker interrogation since around March to April time frame.  Also complained of having some atypical chest pain, end up going to the emergency room. Visit to  the emergency room was unrevealing.  No evidence of MI, however noted to  be in atrial fib. He had used amiodarone  in the past but stopped in 2021 due to toxicities involving  the thyroid and liver.   He is in the afib clinic today to discuss Tikosyn admit to restore SR.  He is very hard of hearing. His wife and daughter  are with him today. He feels poorly in afib with lightheadedness and fatigue. He did miss a dose of xarelto last Thursday pm. He was also on plaquenil for lupus but stopped 5/13 as he did not want potential SE's of drug.   Today, he denies symptoms of palpitations, chest pain, shortness of breath, orthopnea, PND, lower extremity edema, dizziness, presyncope, syncope, or neurologic sequela. The patient is tolerating medications without difficulties and is otherwise without complaint today.   Past Medical History:  Diagnosis Date   Acute prostatitis 06/06/2020   Formatting of this note might be different from the original. 06/06/2020   Aortic stenosis 05/15/2017   Severe by echo from October 2019  Formatting of this note might be different from the original. Formatting of this note might be different from the original. 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 Chest CT 01/18/2019 2019: USN exposure 2019: CXR: IMPRESSION: 1. 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 2. No active infiltrate or effusion. 2019: PULM eval   Atrial fibrillation ([place])    Xarelto   Balanitis 04/10/2020   Formatting of this note might be different from the original. 04/10/2020   Chronic anticoagulation 06/14/2017   CHADS VASC=4   Chronic diastolic CHF (congestive heart failure) ([place]) 05/15/2017   Contact with and (suspected) exposure to asbestos 11/17/2017   Formatting of this note might be different from the original. 1961-64, [place] room with pipe insulation contact and movement 2018: pleural plaque on CT   Diarrhea of presumed infectious origin 09/25/2019   Formatting of this note might be different from the original. 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 02/28/2020   Gastrointestinal hemorrhage 09/25/2019   Formatting of this note might be different from the original. 2021   Hemoptysis 05/15/2017   Formatting of this note might be different from the original. 2019   History of aortic valvular stenosis 02/26/2016   Last Assessment & Plan:  Formatting of this note might be different from  the original. Moderate.   History of gout 08/27/2015   Hyperlipidemia    Hypertension    Hypothyroidism 09/13/2019   Formatting of this note might be different from the original. 2021: TSH 41   Impaired glucose tolerance    Lung nodule 01/25/2019   Formatting of this note is different from the original. Chest CT 01/18/2019 notes a stable 4 mm right middle lobe lung nodule- recheck chest CT August  2021   Lupus [facility])    Normal coronary arteries 06/14/2017   2014 cath in [facility]   Pacemaker    Paroxysmal atrial fibrillation ([place]) 11/19/2014   Xarelto Formatting of this note might be different from the original. Managed CARDS  Formatting of this note might be different from the original. 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 05/2020: ORTHO, myelogram, surg rec   Suspected COVID-19 virus infection 09/25/2019   Formatting of this note might be different from the original. 2021   Thrombocytopenia ([place]) 05/15/2017   Type 2 diabetes mellitus without complication, without long-term current use of insulin ([place]) 08/27/2015   Formatting of this note might be different from the original. 2019: 99/5.3 2020: 125/5.7 2021: 126/6.6   Past Surgical History:  Procedure Laterality Date   CARDIAC CATHETERIZATION  2014   non-obs dz, done at [place] N/A 09/02/2017   Procedure: CARDIOVERSION;  Surgeon: Lelon Perla, MD;  Location: [place];  Service: Cardiovascular;  Laterality: N/A;   COLONOSCOPY     EYE SURGERY     bilateral cataracts   PACEMAKER IMPLANT  03/2012   REPLACEMENT TOTAL KNEE Left    RIGHT/LEFT HEART CATH AND CORONARY ANGIOGRAPHY N/A 03/28/2018   Procedure: RIGHT/LEFT HEART CATH AND CORONARY  ANGIOGRAPHY;  Surgeon: Belva Crome, MD;  Location: [place] CV LAB;  Service: Cardiovascular;  Laterality: N/A;   TEE WITHOUT CARDIOVERSION N/A 05/12/2018   Procedure: TRANSESOPHAGEAL ECHOCARDIOGRAM (TEE);  Surgeon: Sherren Mocha, MD;  Location: [place];  Service: Open Heart Surgery;  Laterality: N/A;   TRANSCATHETER AORTIC VALVE REPLACEMENT, TRANSFEMORAL N/A 05/12/2018   Procedure: TRANSCATHETER AORTIC VALVE REPLACEMENT, TRANSFEMORAL;  Surgeon: Sherren Mocha, MD;  Location: [place];  Service: Open Heart Surgery;  Laterality: N/A;   VASECTOMY      Current Outpatient Medications  Medication Sig Dispense Refill   acetaminophen (TYLENOL) 500 MG tablet Take 1,000 mg by mouth every 6 (six) hours as needed for moderate pain or headache.     amLODipine (NORVASC) 5 MG tablet Take 1 tablet (5 mg total) by mouth daily. 90 tablet 3   levothyroxine (SYNTHROID) 88 MCG tablet Take 88 mcg by mouth daily before breakfast.     metoprolol tartrate (LOPRESSOR) 50 MG tablet TAKE 1 TABLET(50 MG) BY MOUTH TWICE DAILY (Patient taking differently: Take 50 mg by mouth 2 (two) times daily.) 180 tablet  2   Multiple Vitamins-Minerals (PRESERVISION AREDS 2+MULTI VIT) CAPS Take 2 capsules by mouth every morning.     nitroGLYCERIN (NITROSTAT) 0.4 MG SL tablet Place 0.4 mg under the tongue every 5 (five) minutes as needed for chest pain.     pantoprazole (PROTONIX) 40 MG tablet Take 1 tablet by mouth daily.     rosuvastatin (CRESTOR) 40 MG tablet Take 40 mg by mouth daily.     XARELTO 20 MG TABS tablet TAKE 1 TABLET(20 MG) BY MOUTH DAILY WITH SUPPER (Patient taking differently: Take 20 mg by mouth daily with supper.) 90 tablet 1   hydroxychloroquine (PLAQUENIL) 200 MG tablet Take 200 mg by mouth in the morning and at bedtime. (Patient not taking: Reported on 10/08/2021)     No current facility-administered medications for this encounter.    Allergies  Allergen Reactions   Sulfamethoxazole-Trimethoprim Hives     06/17/2020   Allopurinol Rash   Atorvastatin Rash    Social History   Socioeconomic History   Marital status: Married    Spouse name: Not on file   Number of children: Not on file   Years of education: Not on file   Highest education level: Not on file  Occupational History   Occupation: retired  Tobacco Use   Smoking status: Former    Types: Cigarettes    Quit date: 1978    Years since quitting: 45.4   Smokeless tobacco: Never  Vaping Use   Vaping Use: Never used  Substance and Sexual Activity   Alcohol use: No   Drug use: No   Sexual activity: Not on file  Other Topics Concern   Not on file  Social History Narrative   Pt lives in [place] Determinants of Health   Financial Resource Strain: Not on file  Food Insecurity: Not on file  Transportation Needs: Not on file  Physical Activity: Not on file  Stress: Not on file  Social Connections: Not on file  Intimate Partner Violence: Not on file    Family History  Problem Relation Age of Onset   CAD Father    Stroke Father    Colon cancer Sister     ROS- All systems are reviewed and negative except as per the HPI above  Physical Exam: Vitals:   10/08/21 1316  BP: 128/62  Pulse: 60  Weight: 112 kg  Height: '5\' 10"'$  (1.778 m)   Wt Readings from Last 3 Encounters:  10/08/21 112 kg  09/25/21 114.3 kg  06/16/21 114.1 kg    Labs: Lab Results  Component Value Date   NA 137 09/10/2019   K 4.3 09/10/2019   CL 102 09/10/2019   CO2 23 09/10/2019   GLUCOSE 115 (H) 09/10/2019   BUN 15 09/10/2019   CREATININE 0.96 09/10/2019   CALCIUM 9.0 09/10/2019   MG 1.8 05/13/2018   Lab Results  Component Value Date   INR 1.15 05/10/2018   No results found for: CHOL, HDL, LDLCALC, TRIG   GEN- The patient is well appearing, alert and oriented x 3 today.   Head- normocephalic, atraumatic Eyes-  Sclera clear, conjunctiva pink Ears- hearing intact Oropharynx- clear Neck- supple, no JVP Lymph- no cervical  lymphadenopathy Lungs- Clear to ausculation bilaterally, normal work of breathing Heart- Regular rate and rhythm, no murmurs, rubs or gallops, PMI not laterally displaced GI- soft, NT, ND, + BS Extremities- no clubbing, cyanosis, or edema MS- no significant deformity or atrophy Skin- no rash or lesion Psych- euthymic  mood, full affect Neuro- strength and sensation are intact  Echo-  1. Left ventricular ejection fraction, by estimation, is 60 to 65%. The  left ventricle has normal function. The left ventricle has no regional  wall motion abnormalities. There is moderate left ventricular hypertrophy.  Left ventricular diastolic  parameters are indeterminate.   2. Right ventricular systolic function is normal. The right ventricular  size is normal. There is normal pulmonary artery systolic pressure.   3. Left atrial size was severely dilated.   4. The mitral valve is normal in structure. Mild mitral valve  regurgitation. Mild mitral stenosis. Moderate mitral annular  calcification.   5. TAVR satisfactory function . The aortic valve was not well visualized.  There is a 26 mm Sapien prosthetic (TAVR) valve present in the aortic  position. Gradients mentioned below.   6. The inferior vena cava is normal in size with greater than 50%  respiratory variability, suggesting right atrial pressure of 3 mmHg.   EKG-Vent. rate 60 BPM PR interval * ms QRS duration 214 ms QT/QTcB 510/510 ms P-R-T axes * -64 96 Ventricular-paced rhythm Abnormal ECG When compared with ECG of 13-May-2018 04:13, PREVIOUS ECG IS PRESENT    Assessment and Plan:  1. Afib  Persistent for the last couple of months Pt is symptomatic with this He is rate controlled Amiodarone in 2021 caused thyroid/liver issues  We discussed tikosyn today, risk vrs benefit of drug He,  his wife and daughter are in agreement for Tikosyn admit  No benadryl use Cost of drug discussed  Qt today is 510 ms in a v paced pattern with a  QRS width of 214 contributing  to  exaggerated qt interval  Drugs screened by PharmD B[facility]/mag today   2. CHA2DS2VASc  score of at least 6  States he missed one dose of xarelto one week ago Therefore will plan on admission 10/27/21  F/u afib clinic 6/6  Butch Penny C. Anzleigh Slaven, [facility] [address] [place], [place] [address] [phone]

## 2021-10-08 NOTE — Telephone Encounter (Signed)
Medication list reviewed in anticipation of upcoming Tikosyn initiation. Patient is not taking any contraindicated or QTc prolonging medications.   Patient is anticoagulated on Xarelto 20mg daily on the appropriate dose. Please ensure that patient has not missed any anticoagulation doses in the 3 weeks prior to Tikosyn initiation.   Patient will need to be counseled to avoid use of Benadryl while on Tikosyn and in the 2-3 days prior to Tikosyn initiation.  

## 2021-10-09 ENCOUNTER — Other Ambulatory Visit: Payer: Self-pay | Admitting: Cardiology

## 2021-10-09 NOTE — Telephone Encounter (Signed)
Prescription refill request for Xarelto received.  Indication:Afib Last office visit:5/23 Weight:112 kg Age:81 Scr:0.8 CrCl:116.67 ml/min  Prescription refilled

## 2021-10-27 ENCOUNTER — Other Ambulatory Visit: Payer: Self-pay

## 2021-10-27 ENCOUNTER — Encounter (HOSPITAL_COMMUNITY): Payer: Self-pay | Admitting: Physician Assistant

## 2021-10-27 ENCOUNTER — Inpatient Hospital Stay (HOSPITAL_COMMUNITY)
Admission: AD | Admit: 2021-10-27 | Discharge: 2021-10-30 | DRG: 309 | Disposition: A | Discharge status: Home / Self Care | Payer: Medicare Other | Source: Ambulatory Visit | Attending: Cardiology | Admitting: Cardiology

## 2021-10-27 ENCOUNTER — Ambulatory Visit (HOSPITAL_COMMUNITY)
Admission: RE | Admit: 2021-10-27 | Discharge: 2021-10-27 | Disposition: A | Discharge status: Home / Self Care | Payer: Medicare Other | Source: Ambulatory Visit | Attending: Physician Assistant | Admitting: Physician Assistant

## 2021-10-27 ENCOUNTER — Other Ambulatory Visit (HOSPITAL_COMMUNITY): Payer: Self-pay

## 2021-10-27 VITALS — BP 118/72 | HR 71 | Ht 70.0 in | Wt 247.2 lb

## 2021-10-27 DIAGNOSIS — Z881 Allergy status to other antibiotic agents status: Secondary | ICD-10-CM

## 2021-10-27 DIAGNOSIS — M19042 Primary osteoarthritis, left hand: Secondary | ICD-10-CM | POA: Diagnosis present

## 2021-10-27 DIAGNOSIS — M329 Systemic lupus erythematosus, unspecified: Secondary | ICD-10-CM | POA: Diagnosis not present

## 2021-10-27 DIAGNOSIS — Z9852 Vasectomy status: Secondary | ICD-10-CM

## 2021-10-27 DIAGNOSIS — Z6835 Body mass index (BMI) 35.0-35.9, adult: Secondary | ICD-10-CM

## 2021-10-27 DIAGNOSIS — Z950 Presence of cardiac pacemaker: Secondary | ICD-10-CM

## 2021-10-27 DIAGNOSIS — E785 Hyperlipidemia, unspecified: Secondary | ICD-10-CM | POA: Diagnosis not present

## 2021-10-27 DIAGNOSIS — H9190 Unspecified hearing loss, unspecified ear: Secondary | ICD-10-CM | POA: Diagnosis present

## 2021-10-27 DIAGNOSIS — Z79890 Hormone replacement therapy (postmenopausal): Secondary | ICD-10-CM | POA: Diagnosis not present

## 2021-10-27 DIAGNOSIS — J61 Pneumoconiosis due to asbestos and other mineral fibers: Secondary | ICD-10-CM | POA: Diagnosis present

## 2021-10-27 DIAGNOSIS — E119 Type 2 diabetes mellitus without complications: Secondary | ICD-10-CM | POA: Diagnosis not present

## 2021-10-27 DIAGNOSIS — Z888 Allergy status to other drugs, medicaments and biological substances status: Secondary | ICD-10-CM

## 2021-10-27 DIAGNOSIS — D6869 Other thrombophilia: Secondary | ICD-10-CM | POA: Insufficient documentation

## 2021-10-27 DIAGNOSIS — I110 Hypertensive heart disease with heart failure: Secondary | ICD-10-CM | POA: Diagnosis not present

## 2021-10-27 DIAGNOSIS — Z7901 Long term (current) use of anticoagulants: Secondary | ICD-10-CM | POA: Diagnosis not present

## 2021-10-27 DIAGNOSIS — Z96652 Presence of left artificial knee joint: Secondary | ICD-10-CM | POA: Diagnosis present

## 2021-10-27 DIAGNOSIS — Z953 Presence of xenogenic heart valve: Secondary | ICD-10-CM

## 2021-10-27 DIAGNOSIS — E669 Obesity, unspecified: Secondary | ICD-10-CM | POA: Diagnosis present

## 2021-10-27 DIAGNOSIS — E039 Hypothyroidism, unspecified: Secondary | ICD-10-CM | POA: Diagnosis not present

## 2021-10-27 DIAGNOSIS — Z8249 Family history of ischemic heart disease and other diseases of the circulatory system: Secondary | ICD-10-CM

## 2021-10-27 DIAGNOSIS — I4892 Unspecified atrial flutter: Secondary | ICD-10-CM | POA: Diagnosis not present

## 2021-10-27 DIAGNOSIS — Z79899 Other long term (current) drug therapy: Secondary | ICD-10-CM | POA: Diagnosis not present

## 2021-10-27 DIAGNOSIS — M19041 Primary osteoarthritis, right hand: Secondary | ICD-10-CM | POA: Diagnosis not present

## 2021-10-27 DIAGNOSIS — I4819 Other persistent atrial fibrillation: Secondary | ICD-10-CM | POA: Diagnosis not present

## 2021-10-27 DIAGNOSIS — M109 Gout, unspecified: Secondary | ICD-10-CM | POA: Diagnosis not present

## 2021-10-27 DIAGNOSIS — I509 Heart failure, unspecified: Secondary | ICD-10-CM | POA: Diagnosis not present

## 2021-10-27 DIAGNOSIS — I5032 Chronic diastolic (congestive) heart failure: Secondary | ICD-10-CM | POA: Diagnosis present

## 2021-10-27 DIAGNOSIS — K08109 Complete loss of teeth, unspecified cause, unspecified class: Secondary | ICD-10-CM | POA: Diagnosis present

## 2021-10-27 DIAGNOSIS — Z87891 Personal history of nicotine dependence: Secondary | ICD-10-CM | POA: Diagnosis not present

## 2021-10-27 LAB — BASIC METABOLIC PANEL
Anion gap: 9 (ref 5–15)
BUN: 16 mg/dL (ref 8–23)
CO2: 23 mmol/L (ref 22–32)
Calcium: 9 mg/dL (ref 8.9–10.3)
Chloride: 104 mmol/L (ref 98–111)
Creatinine, Ser: 0.79 mg/dL (ref 0.61–1.24)
GFR, Estimated: >60 mL/min (ref >60)
Glucose, Bld: 134 mg/dL — ABNORMAL HIGH (ref 70–99)
Potassium: 4.3 mmol/L (ref 3.5–5.1)
Sodium: 136 mmol/L (ref 135–145)

## 2021-10-27 LAB — MAGNESIUM: Magnesium: 2 mg/dL (ref 1.7–2.4)

## 2021-10-27 MED ORDER — NITROGLYCERIN 0.4 MG SL SUBL: 0.4000 mg | SUBLINGUAL_TABLET | SUBLINGUAL | Status: DC | PRN

## 2021-10-27 MED ORDER — METOPROLOL TARTRATE 50 MG PO TABS
50.0000 mg | ORAL_TABLET | Freq: Two times a day (BID) | ORAL | Status: DC
Start: 2021-10-27 — End: 2021-10-30
  Administered 2021-10-27 – 2021-10-30 (×6): 50 mg via ORAL
  Filled 2021-10-27 (×6): qty 1

## 2021-10-27 MED ORDER — MAGNESIUM SULFATE 2 GM/50ML IV SOLN
2.0000 g | Freq: Once | INTRAVENOUS | Status: AC
  Administered 2021-10-27: 2 g via INTRAVENOUS
  Filled 2021-10-27: qty 50

## 2021-10-27 MED ORDER — ROSUVASTATIN CALCIUM 20 MG PO TABS
40.0000 mg | ORAL_TABLET | Freq: Every day | ORAL | Status: DC
  Administered 2021-10-28 – 2021-10-30 (×3): 40 mg via ORAL
  Filled 2021-10-27 (×3): qty 2

## 2021-10-27 MED ORDER — POTASSIUM CHLORIDE CRYS ER 20 MEQ PO TBCR
20.0000 meq | EXTENDED_RELEASE_TABLET | Freq: Every day | ORAL | Status: DC
  Administered 2021-10-28 – 2021-10-30 (×3): 20 meq via ORAL
  Filled 2021-10-27 (×3): qty 1

## 2021-10-27 MED ORDER — AMLODIPINE BESYLATE 5 MG PO TABS
5.0000 mg | ORAL_TABLET | Freq: Every day | ORAL | Status: DC
  Administered 2021-10-28 – 2021-10-30 (×3): 5 mg via ORAL
  Filled 2021-10-27 (×3): qty 1

## 2021-10-27 MED ORDER — DOFETILIDE 500 MCG PO CAPS
500.0000 ug | ORAL_CAPSULE | Freq: Two times a day (BID) | ORAL | Status: DC
  Administered 2021-10-27 – 2021-10-30 (×6): 500 ug via ORAL
  Filled 2021-10-27 (×6): qty 1

## 2021-10-27 MED ORDER — LEVOTHYROXINE SODIUM 88 MCG PO TABS
88.0000 ug | ORAL_TABLET | Freq: Every day | ORAL | Status: DC
  Administered 2021-10-28 – 2021-10-30 (×3): 88 ug via ORAL
  Filled 2021-10-27 (×3): qty 1

## 2021-10-27 MED ORDER — PROSIGHT PO TABS
2.0000 | ORAL_TABLET | Freq: Every morning | ORAL | Status: DC
  Administered 2021-10-28 – 2021-10-30 (×3): 2 via ORAL
  Filled 2021-10-27 (×3): qty 2

## 2021-10-27 MED ORDER — ACETAMINOPHEN 500 MG PO TABS: 1000.0000 mg | ORAL_TABLET | Freq: Four times a day (QID) | ORAL | Status: DC | PRN

## 2021-10-27 MED ORDER — PANTOPRAZOLE SODIUM 40 MG PO TBEC
40.0000 mg | DELAYED_RELEASE_TABLET | Freq: Every day | ORAL | Status: DC
  Administered 2021-10-28 – 2021-10-30 (×3): 40 mg via ORAL
  Filled 2021-10-27 (×3): qty 1

## 2021-10-27 MED ORDER — SODIUM CHLORIDE 0.9% FLUSH
3.0000 mL | Freq: Two times a day (BID) | INTRAVENOUS | Status: DC
  Administered 2021-10-27 – 2021-10-30 (×7): 3 mL via INTRAVENOUS

## 2021-10-27 MED ORDER — RIVAROXABAN 20 MG PO TABS
20.0000 mg | ORAL_TABLET | Freq: Every day | ORAL | Status: DC
  Administered 2021-10-27 – 2021-10-29 (×3): 20 mg via ORAL
  Filled 2021-10-27 (×3): qty 1

## 2021-10-27 MED ORDER — SODIUM CHLORIDE 0.9% FLUSH: 3.0000 mL | INTRAVENOUS | Status: DC | PRN

## 2021-10-27 MED ORDER — SODIUM CHLORIDE 0.9 % IV SOLN: 250.0000 mL | INTRAVENOUS | Status: DC | PRN

## 2021-10-27 NOTE — Progress Notes (Signed)
Primary Care Physician: Algis Greenhouse, MD Referring Physician: Dr. Agustin Cree  EP: Dr. Raynald Blend Frederick Vasquez is a 81 y.o. male with a h/o  severe aortic stenosis, status post TAVR done in 2019 with 26 [place] 3 valve, paroxysmal atrial fibrillation, dual-chamber pacemaker, essential hypertension, dyslipidemia, chronic back problems.  Recently he started having  difficulty with balance and some falls.  He also was noted to have atrial fibrillation on the pacemaker interrogation since around March to April time frame.  Also complained of having some atypical chest pain, end up going to the emergency room. Visit to  the emergency room was unrevealing.  No evidence of MI, however noted to  be in atrial fib. He had used amiodarone  in the past but stopped in 2021 due to toxicities involving  the thyroid and liver.   He is in the afib clinic today to discuss Tikosyn admit to restore SR.  He is very hard of hearing. His wife and daughter  are with him today. He feels poorly in afib with lightheadedness and fatigue. He did miss a dose of xarelto last Thursday pm. He was also on plaquenil for lupus but stopped 5/13 as he did not want potential SE's of drug.   Follow up in the AF clinic 10/27/21. Patient presents for dofetilide loading today. He denies any missed doses of anticoagulation in the past 3 weeks. He denies any palpitations but does have lightheadedness at times.   Today, he denies symptoms of palpitations, chest pain, shortness of breath, orthopnea, PND, lower extremity edema, presyncope, syncope, or neurologic sequela. The patient is tolerating medications without difficulties and is otherwise without complaint today.   Past Medical History:  Diagnosis Date   Acute prostatitis 06/06/2020   Formatting of this note might be different from the original. 06/06/2020   Aortic stenosis 05/15/2017   Severe by echo from October 2019  Formatting of this note might be different from the  original. Formatting of this note might be different from the original. 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 Chest CT 01/18/2019 2019: USN exposure 2019: CXR: IMPRESSION: 1. 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 2. No active infiltrate or effusion. 2019: PULM eval   Atrial fibrillation ([place])    Xarelto   Balanitis 04/10/2020   Formatting of this note might be different from the original. 04/10/2020   Chronic anticoagulation 06/14/2017   CHADS VASC=4   Chronic diastolic CHF (congestive heart failure) ([place]) 05/15/2017   Contact with and (suspected) exposure to asbestos 11/17/2017   Formatting of this note might be different from the original. 1961-64, [place] room with pipe insulation contact and movement 2018: pleural plaque on CT   Diarrhea of presumed infectious origin 09/25/2019   Formatting of this note might be different from the original. 5638   Diastolic heart failure ([place])    Dyslipidemia 11/19/2014   Dysrhythmia    A- Fib   Elevated troponin 05/15/2017   Encounter for administration of vaccine 11/19/2014   Essential hypertension 05/15/2017   Exacerbation of chronic bronchiolitis ([place]) 02/28/2020   Formatting of this note might be different from the original. 02/28/2020   Gastrointestinal hemorrhage 09/25/2019   Formatting of this note might be different from the original. 2021   Hemoptysis 05/15/2017  Formatting of this note might be different from the original. 2019   History of aortic valvular stenosis 02/26/2016   Last Assessment & Plan:  Formatting of this note might be different from the original. Moderate.   History of gout 08/27/2015   Hyperlipidemia    Hypertension    Hypothyroidism 09/13/2019   Formatting of this note might be different from the original. 2021:  TSH 41   Impaired glucose tolerance    Lung nodule 01/25/2019   Formatting of this note is different from the original. Chest CT 01/18/2019 notes a stable 4 mm right middle lobe lung nodule- recheck chest CT August 2021   Lupus [facility])    Normal coronary arteries 06/14/2017   2014 cath in [facility]   Pacemaker    Paroxysmal atrial fibrillation ([place]) 11/19/2014   Xarelto Formatting of this note might be different from the original. Managed CARDS  Formatting of this note might be different from the original. 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 05/2020: ORTHO, myelogram, surg rec   Suspected COVID-19 virus infection 09/25/2019   Formatting of this note might be different from the original. 2021   Thrombocytopenia ([place]) 05/15/2017   Type 2 diabetes mellitus without complication, without long-term current use of insulin ([place]) 08/27/2015   Formatting of this note might be different from the original. 2019: 99/5.3 2020: 125/5.7 2021: 126/6.6   Past Surgical History:  Procedure Laterality Date   CARDIAC CATHETERIZATION  2014   non-obs dz, done at [facility] N/A 09/02/2017   Procedure: CARDIOVERSION;  Surgeon: Lelon Perla, MD;  Location: [facility] [place];  Service: Cardiovascular;  Laterality: N/A;   COLONOSCOPY      EYE SURGERY     bilateral cataracts   PACEMAKER IMPLANT  03/2012   REPLACEMENT TOTAL KNEE Left    RIGHT/LEFT HEART CATH AND CORONARY ANGIOGRAPHY N/A 03/28/2018   Procedure: RIGHT/LEFT HEART CATH AND CORONARY ANGIOGRAPHY;  Surgeon: Belva Crome, MD;  Location: [facility];  Service: Cardiovascular;  Laterality: N/A;   TEE WITHOUT CARDIOVERSION N/A 05/12/2018   Procedure: TRANSESOPHAGEAL ECHOCARDIOGRAM (TEE);  Surgeon: Sherren Mocha, MD;  Location: [place];  Service: Open Heart Surgery;  Laterality: N/A;   TRANSCATHETER AORTIC VALVE REPLACEMENT, TRANSFEMORAL N/A 05/12/2018   Procedure: TRANSCATHETER AORTIC VALVE REPLACEMENT, TRANSFEMORAL;  Surgeon: Sherren Mocha, MD;  Location: [facility];  Service: Open Heart Surgery;  Laterality: N/A;   VASECTOMY      Current Outpatient Medications  Medication Sig Dispense Refill   acetaminophen (TYLENOL) 500 MG tablet Take 1,000 mg by mouth every 6 (six) hours as needed for moderate pain or headache.     amLODipine (NORVASC) 5 MG tablet Take 1 tablet (5 mg total) by mouth daily. 90 tablet 3   levothyroxine (SYNTHROID) 88 MCG tablet Take 88 mcg by mouth daily  before breakfast.     metoprolol tartrate (LOPRESSOR) 50 MG tablet TAKE 1 TABLET(50 MG) BY MOUTH TWICE DAILY 180 tablet 2   Multiple Vitamins-Minerals (PRESERVISION AREDS 2+MULTI VIT) CAPS Take 2 capsules by mouth every morning.     nitroGLYCERIN (NITROSTAT) 0.4 MG SL tablet Place 0.4 mg under the tongue every 5 (five) minutes as needed for chest pain.     pantoprazole (PROTONIX) 40 MG tablet Take 1 tablet by mouth daily.     potassium chloride SA (KLOR-CON M) 20 MEQ tablet Take 1 tablet (20 mEq total) by mouth daily. 30 tablet 3   rosuvastatin (CRESTOR) 40 MG tablet Take 40 mg by mouth daily.     XARELTO 20 MG TABS tablet TAKE 1 TABLET(20 MG) BY MOUTH DAILY WITH SUPPER 90 tablet 1   No current facility-administered medications for this encounter.    Allergies  Allergen Reactions    Sulfamethoxazole-Trimethoprim Hives    06/17/2020   Allopurinol Rash   Atorvastatin Rash    Social History   Socioeconomic History   Marital status: Married    Spouse name: Not on file   Number of children: Not on file   Years of education: Not on file   Highest education level: Not on file  Occupational History   Occupation: retired  Tobacco Use   Smoking status: Former    Types: Cigarettes    Quit date: 1978    Years since quitting: 45.4   Smokeless tobacco: Never   Tobacco comments:    Former smoker 10/27/21  Vaping Use   Vaping Use: Never used  Substance and Sexual Activity   Alcohol use: No   Drug use: No   Sexual activity: Not on file  Other Topics Concern   Not on file  Social History Narrative   Pt lives in [place] Determinants of Health   Financial Resource Strain: Not on file  Food Insecurity: Not on file  Transportation Needs: Not on file  Physical Activity: Not on file  Stress: Not on file  Social Connections: Not on file  Intimate Partner Violence: Not on file    Family History  Problem Relation Age of Onset   CAD Father    Stroke Father    Colon cancer Sister     ROS- All systems are reviewed and negative except as per the HPI above  Physical Exam: Vitals:   10/27/21 0946  BP: 118/72  Pulse: 71  Weight: 112.1 kg  Height: '5\' 10"'$  (1.778 m)    Wt Readings from Last 3 Encounters:  10/27/21 112.1 kg  10/08/21 112 kg  09/25/21 114.3 kg    Labs: Lab Results  Component Value Date   NA 136 10/27/2021   K 4.3 10/27/2021   CL 104 10/27/2021   CO2 23 10/27/2021   GLUCOSE 134 (H) 10/27/2021   BUN 16 10/27/2021   CREATININE 0.79 10/27/2021   CALCIUM 9.0 10/27/2021   MG 2.0 10/27/2021   Lab Results  Component Value Date   INR 1.15 05/10/2018   No results found for: CHOL, HDL, LDLCALC, TRIG   GEN- The patient is a well appearing elderly obese male, alert and oriented x 3 today.   HEENT-head normocephalic, atraumatic,  sclera clear, conjunctiva pink, hearing intact, trachea midline. Lungs- Clear to ausculation bilaterally, normal work of breathing Heart- Regular rate and rhythm, no murmurs, rubs or gallops  GI- soft, NT, ND, + BS Extremities- no clubbing, cyanosis, or edema MS- no significant deformity or  atrophy Skin- no rash or lesion Psych- euthymic mood, full affect Neuro- strength and sensation are intact   Echo-  1. Left ventricular ejection fraction, by estimation, is 60 to 65%. The left ventricle has normal function. The left ventricle has no regional wall motion abnormalities. There is moderate left ventricular hypertrophy. Left ventricular diastolic parameters are indeterminate.   2. Right ventricular systolic function is normal. The right ventricular size is normal. There is normal pulmonary artery systolic pressure.   3. Left atrial size was severely dilated.   4. The mitral valve is normal in structure. Mild mitral valve  regurgitation. Mild mitral stenosis. Moderate mitral annular  calcification.   5. TAVR satisfactory function . The aortic valve was not well visualized.  There is a 26 mm Sapien prosthetic (TAVR) valve present in the aortic position. Gradients mentioned below.   6. The inferior vena cava is normal in size with greater than 50% respiratory variability, suggesting right atrial pressure of 3 mmHg.    EKG- V pacing with likely underlying afib Vent. rate 71 BPM PR interval * ms QRS duration 204 ms QT/QTcB 480/521 ms    Assessment and Plan:  1. Persistent Afib  Failed amiodarone 2021 due to thyroid/liver issues  Patient presents for dofetilide admission.  Continue Xarelto 20 mg daily, states no missed doses in the last 3 weeks. No recent benadryl use PharmD has screened medications QTc acceptable with V pacing and wide QRS Labs today show creatinine at 0.79, K+ 4.3 and mag 2.0, CrCl calculated at 118 mL/min Continue Lopressor 50 mg BID  2. CHA2DS2VASc  score of at  least 6  Continue Xarelto 20 mg daily  3. Aortic stenosis S/p TAVR  4. HTN Stable, no changes today.   To be admitted later today once a bed becomes available.    [facility] [address] [place], [place] [address] [phone]

## 2021-10-27 NOTE — TOC Initial Note (Signed)
Transition of Care [facility]) - Initial/Assessment Note    Patient Details  Name: Frederick Vasquez MRN: [id number] Date of Birth: [birth year]  Transition of [facility]) CM/SW Contact:    Ninfa Meeker, RN Phone Number: 10/27/2021, 2:41 PM  Clinical Narrative:     Transition of Care screening Note:    Transition of Care Department [facility]) has reviewed patient and no TOC needs have been identified at this time. We will continue to monitor patient advancement through Interdisciplinary progressions. If new patient transition needs arise, please place a consult.     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   Eczema 05/01/2021   Tubular adenoma of colon 03/10/2021   Fracture of humeral head, left, closed, sequela 12/31/2020   Atherosclerosis of both carotid arteries 10/28/2020   Pacemaker    Hypertension    Dysrhythmia    Diastolic heart failure ([place])    Arthritis    Spinal stenosis of lumbar region with neurogenic claudication 06/15/2020   Acute prostatitis 06/06/2020   Balanitis 04/10/2020   Exacerbation of chronic bronchiolitis ([place]) 02/28/2020   Diarrhea of presumed infectious origin 09/25/2019   Gastrointestinal hemorrhage 09/25/2019   Suspected COVID-19 virus infection 09/25/2019   Hypothyroidism 09/13/2019   Lung nodule 01/25/2019   Restrictive lung disease 01/25/2019   S/P TAVR (transcatheter aortic valve replacement)  05/12/2018   Severe aortic stenosis 05/12/2018   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   Contact with and (suspected) exposure to asbestos 11/17/2017   Persistent atrial fibrillation 08/05/2017   Chronic anticoagulation 06/14/2017   Normal coronary arteries 06/14/2017   Hemoptysis 05/15/2017   Aortic stenosis 05/15/2017   Elevated troponin 05/15/2017   Chronic diastolic CHF (congestive heart failure) ([place]) 05/15/2017   Essential hypertension 05/15/2017   Hyperlipidemia 05/15/2017   Impaired glucose tolerance 05/15/2017   Thrombocytopenia ([place]) 05/15/2017   Sepsis ([place]) 05/14/2017   AV block, 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   History of aortic valve stenosis 02/26/2016   History of gout 08/27/2015   Type 2 diabetes mellitus without complication, without long-term current use of insulin ([place]) 08/27/2015   Coronary artery disease cardiac catheterization done in 2019 showing 60% LAD as well as 75+ percent small diagonal branch 08/27/2015   Dyslipidemia 11/19/2014   Encounter for administration of vaccine 11/19/2014   Presence of cardiac pacemaker 11/19/2014   Paroxysmal atrial fibrillation ([place]) 11/19/2014   Iron deficiency anemia 11/19/2014   PCP:  Algis Greenhouse, MD Pharmacy:   [facility] [place], [address] AT [place]  [place] [address] [place] [address] Phone: [phone] Fax: [phone]     Social Determinants of Health (SDOH) Interventions    Readmission Risk Interventions     View : No data to display.

## 2021-10-27 NOTE — Progress Notes (Signed)
Pharmacy: Dofetilide (Tikosyn) - Initial Consult Assessment and Electrolyte Replacement  Pharmacy consulted to assist in monitoring and replacing electrolytes in this 81 y.o. male admitted on 10/27/2021 undergoing dofetilide initiation. First dofetilide dose: 10/27/21  Assessment:  Patient Exclusion Criteria: If any screening criteria checked as "Yes", then  patient  should NOT receive dofetilide until criteria item is corrected.  If "Yes" please indicate correction plan.  YES  NO Patient  Exclusion Criteria Correction Plan   '[x]'$   '[]'$   Baseline QTc interval is greater than or equal to 440 msec. IF above YES box checked dofetilide contraindicated unless patient has ICD; then may proceed if QTc 500-550 msec or with known ventricular conduction abnormalities may proceed with QTc 550-600 msec. QTc = 521 Ok per EP with V-pacing   '[]'$   '[x]'$   Patient is known or suspected to have a digoxin level greater than 2 ng/ml: No results found for: DIGOXIN     '[]'$   '[x]'$   Creatinine clearance less than 20 ml/min (calculated using Cockcroft-Gault, actual body weight and serum creatinine): Estimated Creatinine Clearance: 92.3 mL/min (by C-G formula based on SCr of 0.79 mg/dL).     '[]'$   '[x]'$  Patient has received drugs known to prolong the QT intervals within the last 48 hours (phenothiazines, tricyclics or tetracyclic antidepressants, erythromycin, H-1 antihistamines, cisapride, fluoroquinolones, azithromycin, ondansetron).   Updated information on QT prolonging agents is available to be searched on the following database:QT prolonging agents     '[]'$   '[x]'$   Patient received a dose of hydrochlorothiazide ([place]) alone or in any combination including triamterene (Dyazide, Maxzide) in the last 48 hours.    '[]'$   '[x]'$  Patient received a medication known to increase dofetilide plasma concentrations prior to initial dofetilide dose:  Trimethoprim (Primsol, Proloprim) in the last 36 hours Verapamil (Calan, Verelan)  in the last 36 hours or a sustained release dose in the last 72 hours Megestrol (Megace) in the last 5 days  Cimetidine (Tagamet) in the last 6 hours Ketoconazole (Nizoral) in the last 24 hours Itraconazole (Sporanox) in the last 48 hours  Prochlorperazine (Compazine) in the last 36 hours     '[]'$   '[x]'$   Patient is known to have a history of torsades de pointes; congenital or acquired long QT syndromes.    '[]'$   '[x]'$   Patient has received a Class 1 antiarrhythmic with less than 2 half-lives since last dose. (Disopyramide, Quinidine, Procainamide, Lidocaine, Mexiletine, Flecainide, Propafenone)    '[]'$   '[x]'$   Patient has received amiodarone therapy in the past 3 months or amiodarone level is greater than 0.3 ng/ml.    Patient has been appropriately anticoagulated with Xarelto '20mg'$  daily.  Labs:    Component Value Date/Time   K 4.3 10/27/2021 1000   MG 2.0 10/27/2021 1000     Plan: Potassium: K >/= 4: Appropriate to initiate Tikosyn, no replacement needed    Magnesium: Mg 1.8-2: Give Mg 2 gm IV x1 to prevent Mg from dropping below 1.8 - do not need to recheck Mg. Appropriate to initiate Tikosyn   Thank you for allowing pharmacy to participate in this patient's care   Arrie Senate, PharmD, BCPS, [facility] Clinical Pharmacist [phone] Please check AMION for all [place] numbers 10/27/2021

## 2021-10-27 NOTE — Plan of Care (Signed)
  Problem: Cardiac: Goal: Ability to achieve and maintain adequate cardiopulmonary perfusion will improve Outcome: Progressing   

## 2021-10-27 NOTE — TOC Benefit Eligibility Note (Signed)
Patient Teacher, English as a foreign language completed.    The patient is currently admitted and upon discharge could be taking dofetilide (Tikosyn) 500 mcg capsules.  The current 30 day co-pay is, $38.79.   The patient is insured through [place] [place] Patient Advocate Specialist [place] Patient Advocate Team Direct Number: [phone]  Fax: [phone]

## 2021-10-28 ENCOUNTER — Encounter (HOSPITAL_COMMUNITY): Payer: Self-pay | Admitting: Cardiology

## 2021-10-28 ENCOUNTER — Other Ambulatory Visit: Payer: Self-pay

## 2021-10-28 LAB — BASIC METABOLIC PANEL
Anion gap: 8 (ref 5–15)
BUN: 12 mg/dL (ref 8–23)
CO2: 25 mmol/L (ref 22–32)
Calcium: 8.7 mg/dL — ABNORMAL LOW (ref 8.9–10.3)
Chloride: 101 mmol/L (ref 98–111)
Creatinine, Ser: 0.83 mg/dL (ref 0.61–1.24)
GFR, Estimated: >60 mL/min (ref >60)
Glucose, Bld: 112 mg/dL — ABNORMAL HIGH (ref 70–99)
Potassium: 4 mmol/L (ref 3.5–5.1)
Sodium: 134 mmol/L — ABNORMAL LOW (ref 135–145)

## 2021-10-28 LAB — MAGNESIUM: Magnesium: 2.2 mg/dL (ref 1.7–2.4)

## 2021-10-28 NOTE — Anesthesia Preprocedure Evaluation (Addendum)
Anesthesia Evaluation  Patient identified by MRN, date of birth, ID band Patient awake    Reviewed: Allergy & Precautions, NPO status , Patient's Chart, lab work & pertinent test results  History of Anesthesia Complications Negative for: history of anesthetic complications  Airway Mallampati: I  TM Distance: >3 FB Neck ROM: Full    Dental  (+) Edentulous Upper, Edentulous Lower, Dental Advisory Given   Pulmonary former smoker,  Asbestosis and chronic lung disease.   Pulmonary exam normal        Cardiovascular hypertension, Pt. on medications and Pt. on home beta blockers +CHF  + dysrhythmias (CHB s/p pacemaker) Atrial Fibrillation + pacemaker + Valvular Problems/Murmurs AS  Rhythm:Regular Rate:Normal  Echo- 1. Left ventricular ejection fraction, by estimation, is 60 to 65%. The left ventricle has normal function. The left ventricle has no regional wall motion abnormalities. There is moderate left ventricular hypertrophy. Left ventricular diastolic parameters are indeterminate.  2. Right ventricular systolic function is normal. The right ventricular size is normal. There is normal pulmonary artery systolic pressure.  3. Left atrial size was severely dilated.  4. The mitral valve is normal in structure. Mild mitral valve  regurgitation. Mild mitral stenosis. Moderate mitral annular  calcification.  5. TAVR satisfactory function . The aortic valve was not well visualized.  There is a 26 mm Sapien prosthetic (TAVR) valve present in the aortic position. Gradients mentioned below.  6. The inferior vena cava is normal in size with greater than 50% respiratory variability, suggesting right atrial pressure of 3 mmHg.     Neuro/Psych negative neurological ROS     GI/Hepatic negative GI ROS, Neg liver ROS,   Endo/Other  negative endocrine ROSdiabetes  Renal/GU negative Renal ROS     Musculoskeletal  (+) Arthritis ,    Abdominal   Peds  Hematology negative hematology ROS (+)   Anesthesia Other Findings   Reproductive/Obstetrics                            Lab Results  Component Value Date   WBC 4.6 09/10/2019   HGB 13.2 09/10/2019   HCT 38.4 09/10/2019   MCV 98 (H) 09/10/2019   PLT 94 (LL) 09/10/2019   Lab Results  Component Value Date   CREATININE 0.83 10/28/2021   BUN 12 10/28/2021   NA 134 (L) 10/28/2021   K 4.0 10/28/2021   CL 101 10/28/2021   CO2 25 10/28/2021    Anesthesia Physical  Anesthesia Plan  ASA: 3  Anesthesia Plan: General   Post-op Pain Management: Minimal or no pain anticipated   Induction: Intravenous  PONV Risk Score and Plan: 2 and Ondansetron, Treatment may vary due to age or medical condition and Dexamethasone  Airway Management Planned: Natural Airway and Simple Face Mask  Additional Equipment:   Intra-op Plan:   Post-operative Plan:   Informed Consent: I have reviewed the patients History and Physical, chart, labs and discussed the procedure including the risks, benefits and alternatives for the proposed anesthesia with the patient or authorized representative who has indicated his/her understanding and acceptance.       Plan Discussed with: Anesthesiologist and CRNA  Anesthesia Plan Comments:        Anesthesia Quick Evaluation

## 2021-10-28 NOTE — Progress Notes (Signed)
Pharmacy: Dofetilide (Tikosyn) - Follow Up Assessment and Electrolyte Replacement  Pharmacy consulted to assist in monitoring and replacing electrolytes in this 81 y.o. male admitted on 10/27/2021 undergoing dofetilide initiation. First dofetilide dose: 10/27/21  Labs:    Component Value Date/Time   K 4.0 10/28/2021 0331   MG 2.2 10/28/2021 0331     Plan: Potassium: K >/= 4: No additional supplementation needed  Magnesium: Mg > 2: No additional supplementation needed   Thank you for allowing pharmacy to participate in this patient's care   Arrie Senate, PharmD, BCPS, [facility] Clinical Pharmacist [phone] Please check AMION for all [place] numbers 10/28/2021

## 2021-10-28 NOTE — Progress Notes (Addendum)
Progress Note  Patient Name: Frederick Vasquez Date of Encounter: 10/28/2021  [facility] Cardiologist: [name], MD   Subjective   Feels well  Inpatient Medications    Scheduled Meds:  amLODipine  5 mg Oral Daily   dofetilide  500 mcg Oral BID   levothyroxine  88 mcg Oral Q0600   metoprolol tartrate  50 mg Oral BID   multivitamin  2 tablet Oral q morning   pantoprazole  40 mg Oral Daily   potassium chloride SA  20 mEq Oral Daily   rivaroxaban  20 mg Oral Q supper   rosuvastatin  40 mg Oral Daily   sodium chloride flush  3 mL Intravenous Q12H   Continuous Infusions:  sodium chloride     PRN Meds: sodium chloride, acetaminophen, nitroGLYCERIN, sodium chloride flush   Vital Signs    Vitals:   10/27/21 1500 10/27/21 2100 10/28/21 0436  BP: 122/69 121/67 130/66  Pulse: 61 61 (!) 54  Resp: '16 17 18  '$ Temp: 98.5 F (36.9 C) 97.7 F (36.5 C) 98.4 F (36.9 C)  TempSrc: Oral Oral Oral  SpO2: 96% 97% 97%    Intake/Output Summary (Last 24 hours) at 10/28/2021 6834 Last data filed at 10/27/2021 2030 Gross per 24 hour  Intake 240 ml  Output --  Net 240 ml      10/27/2021    9:46 AM 10/08/2021    1:16 PM 09/25/2021    9:55 AM  Last 3 Weights  Weight (lbs) 247 lb 3.2 oz 247 lb 252 lb  Weight (kg) 112.129 kg 112.038 kg 114.306 kg      Telemetry    AFib, V paced - Personally Reviewed  ECG    AFib, V paced, 60bpm, QRS 257m, QTc given broad QRS is acceptable - Personally Reviewed  Physical Exam   GEN: No acute distress.   Neck: No JVD Cardiac: RRR, (paced), no murmurs, rubs, or gallops.  Respiratory: CTA b/l. GI: Soft, nontender, non-distended  MS: No edema; No deformity. Neuro: HOH, otherwise nonfocal  Psych: Normal affect   Labs    High Sensitivity Troponin:  No results for input(s): TROPONINIHS in the last 720 hours.   Chemistry Recent Labs  Lab 10/27/21 1000 10/28/21 0331  NA 136 134*  K 4.3 4.0  CL 104 101  CO2 23 25  GLUCOSE 134* 112*   BUN 16 12  CREATININE 0.79 0.83  CALCIUM 9.0 8.7*  MG 2.0 2.2  GFRNONAA >60 >60  ANIONGAP 9 8    Lipids No results for input(s): CHOL, TRIG, HDL, LABVLDL, LDLCALC, CHOLHDL in the last 168 hours.  HematologyNo results for input(s): WBC, RBC, HGB, HCT, MCV, MCH, MCHC, RDW, PLT in the last 168 hours. Thyroid No results for input(s): TSH, FREET4 in the last 168 hours.  BNPNo results for input(s): BNP, PROBNP in the last 168 hours.  DDimer No results for input(s): DDIMER in the last 168 hours.   Radiology    No results found.  Cardiac Studies    06/17/2021: TTE  1. Left ventricular ejection fraction, by estimation, is 60 to 65%. The  left ventricle has normal function. The left ventricle has no regional  wall motion abnormalities. There is moderate left ventricular hypertrophy.  Left ventricular diastolic  parameters are indeterminate.   2. Right ventricular systolic function is normal. The right ventricular  size is normal. There is normal pulmonary artery systolic pressure.   3. Left atrial size was severely dilated.   4.  The mitral valve is normal in structure. Mild mitral valve  regurgitation. Mild mitral stenosis. Moderate mitral annular  calcification.   5. TAVR satisfactory function . The aortic valve was not well visualized.  There is a 26 mm Sapien prosthetic (TAVR) valve present in the aortic  position. Gradients mentioned below.   6. The inferior vena cava is normal in size with greater than 50%  respiratory variability, suggesting right atrial pressure of 3 mmHg.    03/28/2018: LHC Moderately severe calcific aortic stenosis.  Peak to peak gradient 45 mmHg, mean gradient 30 mmHg, and calculated aortic valve area 1.3 cm based upon a cardiac output of 6.6 L/min/index 2.92 L/min. Moderate proximal RCA and proximal to mid LAD bulky calcification with plaque up to 60%.  Second diagonal contains 70% stenosis but is a small vessel and management should be with medical  therapy. Calcified left main but no significant obstruction. Circumflex is widely patent. Left ventriculography was not performed.  LVEDP was 20. Mild pulmonary hypertension with PA systolic pressure of 45 mmHg and mean pulmonary capillary wedge pressure 14 mmHg. 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   RECOMMENDATIONS:   Probably a reasonable candidate for TAVR for moderately severe aortic stenosis. Dyspnea on exertion is his major complaint and there is likely contribution from chronic lung disease/asbestosis.  Valve treatment may not completely resolve dyspnea on exertion.  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), HTN, HLD, hard of hearing, AFib, symptomatic bradycardia w/PPM, admitted for tikosyn start  [place]    Persistent AFib CHA2DS2Vasc is 3, on Xarelto Tiksoyn load is in progress K+ 4.0 Mag 2.2 Creat 0.83 QT stable and acceptable given broad/paced QRS duration  DCCV tomorrow if needed, pt is agreeable   VHD  S/p TAVR 2019, functioning well  HTN Home meds    For questions or updates, please contact [facility] Please consult [email or web address] for contact info under        Signed, Baldwin Jamaica, PA-C  10/28/2021, 8:21 AM    I have seen and examined this patient with Tommye Standard.  Agree with above, note added to reflect my findings.  Feeling well, continued fatigue  GEN: Well nourished, well developed, in no acute distress  HEENT: normal  Neck: no JVD, carotid bruits, or masses Cardiac: RRR; no murmurs, rubs, or gallops,no edema  Respiratory:  clear to auscultation bilaterally, normal work of breathing GI: soft, nontender, nondistended, + BS MS: no deformity or atrophy  Skin: warm and dry, Neuro:  Strength and sensation are intact Psych: euthymic mood, full affect   Persistent atrial fibrillation: admission for Tikosyn. QTc not prolonged. Continue with current dose. Plan for DCCV tomorrow.  Lakyla Biswas M.  Lora Glomski MD 10/28/2021 10:04 AM

## 2021-10-28 NOTE — Plan of Care (Signed)

## 2021-10-28 NOTE — H&P (View-Only) (Signed)
Progress Note  Patient Name: Frederick Vasquez Date of Encounter: 10/28/2021  [facility] Cardiologist: [name], MD   Subjective   Feels well  Inpatient Medications    Scheduled Meds:  amLODipine  5 mg Oral Daily   dofetilide  500 mcg Oral BID   levothyroxine  88 mcg Oral Q0600   metoprolol tartrate  50 mg Oral BID   multivitamin  2 tablet Oral q morning   pantoprazole  40 mg Oral Daily   potassium chloride SA  20 mEq Oral Daily   rivaroxaban  20 mg Oral Q supper   rosuvastatin  40 mg Oral Daily   sodium chloride flush  3 mL Intravenous Q12H   Continuous Infusions:  sodium chloride     PRN Meds: sodium chloride, acetaminophen, nitroGLYCERIN, sodium chloride flush   Vital Signs    Vitals:   10/27/21 1500 10/27/21 2100 10/28/21 0436  BP: 122/69 121/67 130/66  Pulse: 61 61 (!) 54  Resp: '16 17 18  '$ Temp: 98.5 F (36.9 C) 97.7 F (36.5 C) 98.4 F (36.9 C)  TempSrc: Oral Oral Oral  SpO2: 96% 97% 97%    Intake/Output Summary (Last 24 hours) at 10/28/2021 6962 Last data filed at 10/27/2021 2030 Gross per 24 hour  Intake 240 ml  Output --  Net 240 ml      10/27/2021    9:46 AM 10/08/2021    1:16 PM 09/25/2021    9:55 AM  Last 3 Weights  Weight (lbs) 247 lb 3.2 oz 247 lb 252 lb  Weight (kg) 112.129 kg 112.038 kg 114.306 kg      Telemetry    AFib, V paced - Personally Reviewed  ECG    AFib, V paced, 60bpm, QRS 253m, QTc given broad QRS is acceptable - Personally Reviewed  Physical Exam   GEN: No acute distress.   Neck: No JVD Cardiac: RRR, (paced), no murmurs, rubs, or gallops.  Respiratory: CTA b/l. GI: Soft, nontender, non-distended  MS: No edema; No deformity. Neuro: HOH, otherwise nonfocal  Psych: Normal affect   Labs    High Sensitivity Troponin:  No results for input(s): TROPONINIHS in the last 720 hours.   Chemistry Recent Labs  Lab 10/27/21 1000 10/28/21 0331  NA 136 134*  K 4.3 4.0  CL 104 101  CO2 23 25  GLUCOSE 134* 112*   BUN 16 12  CREATININE 0.79 0.83  CALCIUM 9.0 8.7*  MG 2.0 2.2  GFRNONAA >60 >60  ANIONGAP 9 8    Lipids No results for input(s): CHOL, TRIG, HDL, LABVLDL, LDLCALC, CHOLHDL in the last 168 hours.  HematologyNo results for input(s): WBC, RBC, HGB, HCT, MCV, MCH, MCHC, RDW, PLT in the last 168 hours. Thyroid No results for input(s): TSH, FREET4 in the last 168 hours.  BNPNo results for input(s): BNP, PROBNP in the last 168 hours.  DDimer No results for input(s): DDIMER in the last 168 hours.   Radiology    No results found.  Cardiac Studies    06/17/2021: TTE  1. Left ventricular ejection fraction, by estimation, is 60 to 65%. The  left ventricle has normal function. The left ventricle has no regional  wall motion abnormalities. There is moderate left ventricular hypertrophy.  Left ventricular diastolic  parameters are indeterminate.   2. Right ventricular systolic function is normal. The right ventricular  size is normal. There is normal pulmonary artery systolic pressure.   3. Left atrial size was severely dilated.   4.  The mitral valve is normal in structure. Mild mitral valve  regurgitation. Mild mitral stenosis. Moderate mitral annular  calcification.   5. TAVR satisfactory function . The aortic valve was not well visualized.  There is a 26 mm Sapien prosthetic (TAVR) valve present in the aortic  position. Gradients mentioned below.   6. The inferior vena cava is normal in size with greater than 50%  respiratory variability, suggesting right atrial pressure of 3 mmHg.    03/28/2018: LHC Moderately severe calcific aortic stenosis.  Peak to peak gradient 45 mmHg, mean gradient 30 mmHg, and calculated aortic valve area 1.3 cm based upon a cardiac output of 6.6 L/min/index 2.92 L/min. Moderate proximal RCA and proximal to mid LAD bulky calcification with plaque up to 60%.  Second diagonal contains 70% stenosis but is a small vessel and management should be with medical  therapy. Calcified left main but no significant obstruction. Circumflex is widely patent. Left ventriculography was not performed.  LVEDP was 20. Mild pulmonary hypertension with PA systolic pressure of 45 mmHg and mean pulmonary capillary wedge pressure 14 mmHg. 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   RECOMMENDATIONS:   Probably a reasonable candidate for TAVR for moderately severe aortic stenosis. Dyspnea on exertion is his major complaint and there is likely contribution from chronic lung disease/asbestosis.  Valve treatment may not completely resolve dyspnea on exertion.  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), HTN, HLD, hard of hearing, AFib, symptomatic bradycardia w/PPM, admitted for tikosyn start  [place]    Persistent AFib CHA2DS2Vasc is 3, on Xarelto Tiksoyn load is in progress K+ 4.0 Mag 2.2 Creat 0.83 QT stable and acceptable given broad/paced QRS duration  DCCV tomorrow if needed, pt is agreeable   VHD  S/p TAVR 2019, functioning well  HTN Home meds    For questions or updates, please contact [facility] Please consult [email or web address] for contact info under        Signed, Baldwin Jamaica, PA-C  10/28/2021, 8:21 AM    I have seen and examined this patient with Tommye Standard.  Agree with above, note added to reflect my findings.  Feeling well, continued fatigue  GEN: Well nourished, well developed, in no acute distress  HEENT: normal  Neck: no JVD, carotid bruits, or masses Cardiac: RRR; no murmurs, rubs, or gallops,no edema  Respiratory:  clear to auscultation bilaterally, normal work of breathing GI: soft, nontender, nondistended, + BS MS: no deformity or atrophy  Skin: warm and dry, Neuro:  Strength and sensation are intact Psych: euthymic mood, full affect   Persistent atrial fibrillation: admission for Tikosyn. QTc not prolonged. Continue with current dose. Plan for DCCV tomorrow.  Cameryn Schum M.  Madyx Delfin MD 10/28/2021 10:04 AM

## 2021-10-28 NOTE — H&P (Signed)
Primary Care Physician: Algis Greenhouse, MD Referring Physician: Dr. Agustin Cree  EP: Dr. Raynald Blend Frederick Vasquez is a 81 y.o. male with a h/o  severe aortic stenosis, status post TAVR done in 2019 with 26 Balta 3 valve, paroxysmal atrial fibrillation, dual-chamber pacemaker, essential hypertension, dyslipidemia, chronic back problems.  Recently he started having  difficulty with balance and some falls.  He also was noted to have atrial fibrillation on the pacemaker interrogation since around March to April time frame.  Also complained of having some atypical chest pain, end up going to the emergency room. Visit to  the emergency room was unrevealing.  No evidence of MI, however noted to  be in atrial fib. He had used amiodarone  in the past but stopped in 2021 due to toxicities involving  the thyroid and liver.    He is in the afib clinic today to discuss Tikosyn admit to restore SR.  He is very hard of hearing. His wife and daughter  are with him today. He feels poorly in afib with lightheadedness and fatigue. He did miss a dose of xarelto last Thursday pm. He was also on plaquenil for lupus but stopped 5/13 as he did not want potential SE's of drug.    Follow up in the AF clinic 10/27/21. Patient presents for dofetilide loading today. He denies any missed doses of anticoagulation in the past 3 weeks. He denies any palpitations but does have lightheadedness at times.    Today, he denies symptoms of palpitations, chest pain, shortness of breath, orthopnea, PND, lower extremity edema, presyncope, syncope, or neurologic sequela. The patient is tolerating medications without difficulties and is otherwise without complaint today.        Past Medical History:  Diagnosis Date   Acute prostatitis 06/06/2020    Formatting of this note might be different from the original. 06/06/2020   Aortic stenosis 05/15/2017    Severe by echo from October 2019  Formatting of this note might be different from  the original. Formatting of this note might be different from the original. 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 Chest CT 01/18/2019 2019: USN exposure 2019: CXR: IMPRESSION: 1. 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 2. No active infiltrate or effusion. 2019: PULM eval   Atrial fibrillation ([place])      Xarelto   Balanitis 04/10/2020    Formatting of this note might be different from the original. 04/10/2020   Chronic anticoagulation 06/14/2017    CHADS VASC=4   Chronic diastolic CHF (congestive heart failure) ([place]) 05/15/2017   Contact with and (suspected) exposure to asbestos 11/17/2017    Formatting of this note might be different from the original. 1961-64, [place] room with pipe insulation contact and movement 2018: pleural plaque on CT   Diarrhea of presumed infectious origin 09/25/2019    Formatting of this note might be different from the original. 1941   Diastolic heart failure ([place])     Dyslipidemia 11/19/2014   Dysrhythmia      A- Fib   Elevated troponin 05/15/2017   Encounter for administration of vaccine 11/19/2014   Essential hypertension 05/15/2017   Exacerbation of chronic bronchiolitis ([place]) 02/28/2020    Formatting of this note might be different from the  original. 02/28/2020   Gastrointestinal hemorrhage 09/25/2019    Formatting of this note might be different from the original. 2021   Hemoptysis 05/15/2017    Formatting of this note might be different from the original. 2019   History of aortic valvular stenosis 02/26/2016    Last Assessment & Plan:  Formatting of this note might be different from the original. Moderate.   History of gout 08/27/2015   Hyperlipidemia     Hypertension     Hypothyroidism 09/13/2019    Formatting of this note might be different from  the original. 2021: TSH 41   Impaired glucose tolerance     Lung nodule 01/25/2019    Formatting of this note is different from the original. Chest CT 01/18/2019 notes a stable 4 mm right middle lobe lung nodule- recheck chest CT August 2021   Lupus [facility])     Normal coronary arteries 06/14/2017    2014 cath in [facility]   Pacemaker     Paroxysmal atrial fibrillation ([place]) 11/19/2014    Xarelto Formatting of this note might be different from the original. Managed CARDS  Formatting of this note might be different from the original. 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 05/2020: ORTHO, myelogram, surg rec   Suspected COVID-19 virus infection 09/25/2019    Formatting of this note might be different from the original. 2021   Thrombocytopenia ([place]) 05/15/2017   Type 2 diabetes mellitus without complication, without long-term current use of insulin ([place]) 08/27/2015    Formatting of this note might be different from the original. 2019: 99/5.3 2020: 125/5.7 2021: 126/6.6         Past Surgical History:  Procedure Laterality Date   CARDIAC CATHETERIZATION   2014    non-obs dz, done at [facility] N/A 09/02/2017    Procedure: CARDIOVERSION;  Surgeon: Lelon Perla, MD;  Location: [facility];  Service:  Cardiovascular;  Laterality: N/A;   COLONOSCOPY       EYE SURGERY        bilateral cataracts   PACEMAKER IMPLANT   03/2012   REPLACEMENT TOTAL KNEE Left     RIGHT/LEFT HEART CATH AND CORONARY ANGIOGRAPHY N/A 03/28/2018    Procedure: RIGHT/LEFT HEART CATH AND CORONARY ANGIOGRAPHY;  Surgeon: Belva Crome, MD;  Location: [place] CV LAB;  Service: Cardiovascular;  Laterality: N/A;   TEE WITHOUT CARDIOVERSION N/A 05/12/2018    Procedure: TRANSESOPHAGEAL ECHOCARDIOGRAM (TEE);  Surgeon: Sherren Mocha, MD;  Location: [place];  Service: Open Heart Surgery;  Laterality: N/A;   TRANSCATHETER AORTIC VALVE REPLACEMENT, TRANSFEMORAL N/A 05/12/2018    Procedure: TRANSCATHETER AORTIC VALVE REPLACEMENT, TRANSFEMORAL;  Surgeon: Sherren Mocha, MD;  Location: [place];  Service: Open Heart Surgery;  Laterality: N/A;   VASECTOMY  Current Outpatient Medications  Medication Sig Dispense Refill   acetaminophen (TYLENOL) 500 MG tablet Take 1,000 mg by mouth every 6 (six) hours as needed for moderate pain or headache.       amLODipine (NORVASC) 5 MG tablet Take 1 tablet (5 mg total) by mouth daily. 90 tablet 3   levothyroxine (SYNTHROID) 88 MCG tablet Take 88 mcg by mouth daily before breakfast.       metoprolol tartrate (LOPRESSOR) 50 MG tablet TAKE 1 TABLET(50 MG) BY MOUTH TWICE DAILY 180 tablet 2   Multiple Vitamins-Minerals (PRESERVISION AREDS 2+MULTI VIT) CAPS Take 2 capsules by mouth every morning.       nitroGLYCERIN (NITROSTAT) 0.4 MG SL tablet Place 0.4 mg under the tongue every 5 (five) minutes as needed for chest pain.       pantoprazole (PROTONIX) 40 MG tablet Take 1 tablet by mouth daily.       potassium chloride SA (KLOR-CON M) 20 MEQ tablet Take 1 tablet (20 mEq total) by mouth daily. 30 tablet 3   rosuvastatin (CRESTOR) 40 MG tablet Take 40 mg by mouth daily.       XARELTO 20 MG TABS tablet TAKE 1 TABLET(20 MG) BY MOUTH DAILY WITH SUPPER 90 tablet 1    No current  facility-administered medications for this encounter.           Allergies  Allergen Reactions   Sulfamethoxazole-Trimethoprim Hives      06/17/2020   Allopurinol Rash   Atorvastatin Rash      Social History         Socioeconomic History   Marital status: Married      Spouse name: Not on file   Number of children: Not on file   Years of education: Not on file   Highest education level: Not on file  Occupational History   Occupation: retired  Tobacco Use   Smoking status: Former      Types: Cigarettes      Quit date: 1978      Years since quitting: 45.4   Smokeless tobacco: Never   Tobacco comments:      Former smoker 10/27/21  Vaping Use   Vaping Use: Never used  Substance and Sexual Activity   Alcohol use: No   Drug use: No   Sexual activity: Not on file  Other Topics Concern   Not on file  Social History Narrative    Pt lives in [place] Determinants of Health    Financial Resource Strain: Not on file  Food Insecurity: Not on file  Transportation Needs: Not on file  Physical Activity: Not on file  Stress: Not on file  Social Connections: Not on file  Intimate Partner Violence: Not on file           Family History  Problem Relation Age of Onset   CAD Father     Stroke Father     Colon cancer Sister        ROS- All systems are reviewed and negative except as per the HPI above   Physical Exam:    Vitals:    10/27/21 0946  BP: 118/72  Pulse: 71  Weight: 112.1 kg  Height: '5\' 10"'$  (1.778 m)         Wt Readings from Last 3 Encounters:  10/27/21 112.1 kg  10/08/21 112 kg  09/25/21 114.3 kg      Labs: Recent [employer]  Value Date    NA 136 10/27/2021    K 4.3 10/27/2021    CL 104 10/27/2021    CO2 23 10/27/2021    GLUCOSE 134 (H) 10/27/2021    BUN 16 10/27/2021    CREATININE 0.79 10/27/2021    CALCIUM 9.0 10/27/2021    MG 2.0 10/27/2021      Recent Labs       Lab Results  Component Value Date     INR 1.15 05/10/2018      Recent Labs  No results found for: CHOL, HDL, LDLCALC, TRIG       GEN- The patient is a well appearing elderly obese male, alert and oriented x 3 today.   HEENT-head normocephalic, atraumatic, sclera clear, conjunctiva pink, hearing intact, trachea midline. Lungs- Clear to ausculation bilaterally, normal work of breathing Heart- Regular rate and rhythm, no murmurs, rubs or gallops  GI- soft, NT, ND, + BS Extremities- no clubbing, cyanosis, or edema MS- no significant deformity or atrophy Skin- no rash or lesion Psych- euthymic mood, full affect Neuro- strength and sensation are intact     Echo-  1. Left ventricular ejection fraction, by estimation, is 60 to 65%. The left ventricle has normal function. The left ventricle has no regional wall motion abnormalities. There is moderate left ventricular hypertrophy. Left ventricular diastolic parameters are indeterminate.   2. Right ventricular systolic function is normal. The right ventricular size is normal. There is normal pulmonary artery systolic pressure.   3. Left atrial size was severely dilated.   4. The mitral valve is normal in structure. Mild mitral valve  regurgitation. Mild mitral stenosis. Moderate mitral annular  calcification.   5. TAVR satisfactory function . The aortic valve was not well visualized.  There is a 26 mm Sapien prosthetic (TAVR) valve present in the aortic position. Gradients mentioned below.   6. The inferior vena cava is normal in size with greater than 50% respiratory variability, suggesting right atrial pressure of 3 mmHg.      EKG- V pacing with likely underlying afib Vent. rate 71 BPM PR interval * ms QRS duration 204 ms QT/QTcB 480/521 ms       Assessment and Plan:  1. Persistent Afib  Failed amiodarone 2021 due to thyroid/liver issues  Patient presents for dofetilide admission.  Continue Xarelto 20 mg daily, states no missed doses in the last 3 weeks. No recent  benadryl use PharmD has screened medications QTc acceptable with V pacing and wide QRS Labs today show creatinine at 0.79, K+ 4.3 and mag 2.0, CrCl calculated at 118 mL/min Continue Lopressor 50 mg BID   2. CHA2DS2VASc  score of at least 6  Continue Xarelto 20 mg daily   3. Aortic stenosis S/p TAVR   4. HTN Stable, no changes today.   ----------------------------  I have seen, examined the patient, and reviewed the above assessment and plan.    Frederick Vasquez is an 81 year old man with persistent atrial fibrillation who presents for Tikosyn loading.  He has previously failed amiodarone.  He is on Xarelto for stroke prophylaxis with no missed doses.  QTc is acceptable for first dose.  Creatinine is stable.  Physical exam today significant for an elderly man in no distress.  He has a regular rate and rhythm.  No edema.  No increased work of breathing.   Vickie Epley, MD 10/28/2021 8:33 AM

## 2021-10-29 ENCOUNTER — Encounter (HOSPITAL_COMMUNITY)
Admission: AD | Disposition: A | Discharge status: Home / Self Care | Payer: Self-pay | Source: Ambulatory Visit | Attending: Cardiology

## 2021-10-29 ENCOUNTER — Inpatient Hospital Stay (HOSPITAL_COMMUNITY): Payer: Medicare Other | Admitting: Anesthesiology

## 2021-10-29 ENCOUNTER — Encounter (HOSPITAL_COMMUNITY): Payer: Self-pay | Admitting: Cardiology

## 2021-10-29 DIAGNOSIS — I110 Hypertensive heart disease with heart failure: Secondary | ICD-10-CM

## 2021-10-29 DIAGNOSIS — E119 Type 2 diabetes mellitus without complications: Secondary | ICD-10-CM

## 2021-10-29 DIAGNOSIS — I4819 Other persistent atrial fibrillation: Secondary | ICD-10-CM | POA: Diagnosis not present

## 2021-10-29 DIAGNOSIS — I4892 Unspecified atrial flutter: Secondary | ICD-10-CM

## 2021-10-29 DIAGNOSIS — I509 Heart failure, unspecified: Secondary | ICD-10-CM

## 2021-10-29 HISTORY — PX: CARDIOVERSION: SHX1299

## 2021-10-29 LAB — BASIC METABOLIC PANEL
Anion gap: 9 (ref 5–15)
BUN: 17 mg/dL (ref 8–23)
CO2: 22 mmol/L (ref 22–32)
Calcium: 8.6 mg/dL — ABNORMAL LOW (ref 8.9–10.3)
Chloride: 101 mmol/L (ref 98–111)
Creatinine, Ser: 0.96 mg/dL (ref 0.61–1.24)
GFR, Estimated: >60 mL/min (ref >60)
Glucose, Bld: 124 mg/dL — ABNORMAL HIGH (ref 70–99)
Potassium: 4.2 mmol/L (ref 3.5–5.1)
Sodium: 132 mmol/L — ABNORMAL LOW (ref 135–145)

## 2021-10-29 LAB — MAGNESIUM: Magnesium: 2.1 mg/dL (ref 1.7–2.4)

## 2021-10-29 SURGERY — CARDIOVERSION
Anesthesia: General

## 2021-10-29 MED ORDER — LIDOCAINE 2% (20 MG/ML) 5 ML SYRINGE
INTRAMUSCULAR | Status: DC | PRN
  Administered 2021-10-29: 100 mg via INTRAVENOUS

## 2021-10-29 MED ORDER — SODIUM CHLORIDE 0.9 % IV SOLN: INTRAVENOUS | Status: DC

## 2021-10-29 MED ORDER — PROPOFOL 10 MG/ML IV BOLUS
INTRAVENOUS | Status: DC | PRN
  Administered 2021-10-29: 60 mg via INTRAVENOUS

## 2021-10-29 NOTE — Anesthesia Postprocedure Evaluation (Signed)
Anesthesia Post Note  Patient: Frederick Vasquez  Procedure(s) Performed: CARDIOVERSION     Patient location during evaluation: Endoscopy Anesthesia Type: General Level of consciousness: awake and alert Pain management: pain level controlled Vital Signs Assessment: post-procedure vital signs reviewed and stable Respiratory status: spontaneous breathing, nonlabored ventilation, respiratory function stable and patient connected to nasal cannula oxygen Cardiovascular status: blood pressure returned to baseline and stable Postop Assessment: no apparent nausea or vomiting Anesthetic complications: no   No notable events documented.  Last Vitals:  Vitals:   10/29/21 0845 10/29/21 0905  BP: 124/74 130/87  Pulse: 79 96  Resp: 13 14  Temp:    SpO2: 98% 98%    Last Pain:  Vitals:   10/29/21 0927  TempSrc:   PainSc: 0-No pain                 Makenley Shimp,Michaela DANIEL

## 2021-10-29 NOTE — Plan of Care (Signed)

## 2021-10-29 NOTE — Progress Notes (Addendum)
Progress Note  Patient Name: Frederick Vasquez Date of Encounter: 10/29/2021  [facility] Cardiologist: [name], MD   Subjective   Feels well, family at bedside  Inpatient Medications    Scheduled Meds:  amLODipine  5 mg Oral Daily   dofetilide  500 mcg Oral BID   levothyroxine  88 mcg Oral Q0600   metoprolol tartrate  50 mg Oral BID   multivitamin  2 tablet Oral q morning   pantoprazole  40 mg Oral Daily   potassium chloride SA  20 mEq Oral Daily   rivaroxaban  20 mg Oral Q supper   rosuvastatin  40 mg Oral Daily   sodium chloride flush  3 mL Intravenous Q12H   Continuous Infusions:  sodium chloride     PRN Meds: sodium chloride, acetaminophen, nitroGLYCERIN, sodium chloride flush   Vital Signs    Vitals:   10/29/21 0825 10/29/21 0827 10/29/21 0836 10/29/21 0845  BP: 106/61 (!) 121/57 (!) 126/59 124/74  Pulse: 77 77 78 79  Resp: '19 17 13 13  '$ Temp:      TempSrc:      SpO2: 96% 97% 99% 98%  Weight:      Height:        Intake/Output Summary (Last 24 hours) at 10/29/2021 0904 Last data filed at 10/29/2021 0823 Gross per 24 hour  Intake 340 ml  Output --  Net 340 ml      10/28/2021    9:37 AM 10/27/2021    9:46 AM 10/08/2021    1:16 PM  Last 3 Weights  Weight (lbs) 247 lb 3.2 oz 247 lb 3.2 oz 247 lb  Weight (kg) 112.129 kg 112.129 kg 112.038 kg      Telemetry    AFib, V paced - Personally Reviewed  ECG    AFib, V paced, 60bpm, QRS 242m, QTc given broad QRS is acceptable - Personally Reviewed  Physical Exam   Exam is unchanged GEN: No acute distress.   Neck: No JVD Cardiac: RRR, (paced), no murmurs, rubs, or gallops.  Respiratory: CTA b/l. GI: Soft, nontender, non-distended  MS: No edema; No deformity. Neuro: HOH, otherwise nonfocal  Psych: Normal affect   Labs    High Sensitivity Troponin:  No results for input(s): "TROPONINIHS" in the last 720 hours.   Chemistry Recent Labs  Lab 10/27/21 1000 10/28/21 0331 10/29/21 0202  NA 136  134* 132*  K 4.3 4.0 4.2  CL 104 101 101  CO2 '23 25 22  '$ GLUCOSE 134* 112* 124*  BUN '16 12 17  '$ CREATININE 0.79 0.83 0.96  CALCIUM 9.0 8.7* 8.6*  MG 2.0 2.2 2.1  GFRNONAA >60 >60 >60  ANIONGAP '9 8 9    '$ Lipids No results for input(s): "CHOL", "TRIG", "HDL", "LABVLDL", "LDLCALC", "CHOLHDL" in the last 168 hours.  HematologyNo results for input(s): "WBC", "RBC", "HGB", "HCT", "MCV", "MCH", "MCHC", "RDW", "PLT" in the last 168 hours. Thyroid No results for input(s): "TSH", "FREET4" in the last 168 hours.  BNPNo results for input(s): "BNP", "PROBNP" in the last 168 hours.  DDimer No results for input(s): "DDIMER" in the last 168 hours.   Radiology    No results found.  Cardiac Studies    06/17/2021: TTE  1. Left ventricular ejection fraction, by estimation, is 60 to 65%. The  left ventricle has normal function. The left ventricle has no regional  wall motion abnormalities. There is moderate left ventricular hypertrophy.  Left ventricular diastolic  parameters are indeterminate.  2. Right ventricular systolic function is normal. The right ventricular  size is normal. There is normal pulmonary artery systolic pressure.   3. Left atrial size was severely dilated.   4. The mitral valve is normal in structure. Mild mitral valve  regurgitation. Mild mitral stenosis. Moderate mitral annular  calcification.   5. TAVR satisfactory function . The aortic valve was not well visualized.  There is a 26 mm Sapien prosthetic (TAVR) valve present in the aortic  position. Gradients mentioned below.   6. The inferior vena cava is normal in size with greater than 50%  respiratory variability, suggesting right atrial pressure of 3 mmHg.    03/28/2018: LHC Moderately severe calcific aortic stenosis.  Peak to peak gradient 45 mmHg, mean gradient 30 mmHg, and calculated aortic valve area 1.3 cm based upon a cardiac output of 6.6 L/min/index 2.92 L/min. Moderate proximal RCA and proximal to mid LAD  bulky calcification with plaque up to 60%.  Second diagonal contains 70% stenosis but is a small vessel and management should be with medical therapy. Calcified left main but no significant obstruction. Circumflex is widely patent. Left ventriculography was not performed.  LVEDP was 20. Mild pulmonary hypertension with PA systolic pressure of 45 mmHg and mean pulmonary capillary wedge pressure 14 mmHg. 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   RECOMMENDATIONS:   Probably a reasonable candidate for TAVR for moderately severe aortic stenosis. Dyspnea on exertion is his major complaint and there is likely contribution from chronic lung disease/asbestosis.  Valve treatment may not completely resolve dyspnea on exertion.  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), HTN, HLD, hard of hearing, AFib, symptomatic bradycardia w/PPM, admitted for tikosyn start  [place]    Persistent AFib CHA2DS2Vasc is 3, on Xarelto Tiksoyn load is in progress K+ 4.2 Mag 2.1 Creat 0.96 QT stable and acceptable given broad/paced QRS duration  DCCV today, he remains agreeable Anticipate d/c tomorrow   VHD  S/p TAVR 2019, functioning well  HTN Home meds    For questions or updates, please contact [facility] Please consult [email or web address] for contact info under        Signed, Frederick Jamaica, PA-C  10/29/2021, 9:04 AM    I have seen and examined this patient with Frederick Vasquez.  Agree with above, note added to reflect my findings.  Feeling well without complaint  GEN: Well nourished, well developed, in no acute distress  HEENT: normal  Neck: no JVD, carotid bruits, or masses Cardiac: RRR; no murmurs, rubs, or gallops,no edema  Respiratory:  clear to auscultation bilaterally, normal work of breathing GI: soft, nontender, nondistended, + BS MS: no deformity or atrophy  Skin: warm and dry, device site well healed Neuro:  Strength and sensation are  intact Psych: euthymic mood, full affect   Persistent atrial fibrillation: Admission for dofetilide load.  QTc is remained stable.  Plan for cardioversion today.  Likely discharge tomorrow.  Frederick Vasquez M. Akshaj Besancon MD 10/29/2021 10:36 AM

## 2021-10-29 NOTE — CV Procedure (Signed)
Procedure: Electrical Cardioversion Indications:  Atrial Fibrillation  Procedure Details:  Consent: Risks of procedure as well as the alternatives and risks of each were explained to the (patient/caregiver).  Consent for procedure obtained.  Time Out: Verified patient identification, verified procedure, site/side was marked, verified correct patient position, special equipment/implants available, medications/allergies/relevent history reviewed, required imaging and test results available. PERFORMED.  Patient placed on cardiac monitor, pulse oximetry, supplemental oxygen as necessary.  Sedation given:  Propofol '60mg'$ , lidocaine '100mg'$  Pacer pads placed anterior and posterior chest.  Cardioverted 1 time(s).  Cardioversion with synchronized biphasic 200J shock.  Evaluation: Findings: Post procedure EKG shows:  A-sensed, v-paced Complications: None Patient did tolerate procedure well.  Time Spent Directly with the Patient:  10mnutes   Frederick Bergeron6/12/2021, 8:19 AM

## 2021-10-29 NOTE — Progress Notes (Signed)
Pharmacy: Dofetilide (Tikosyn) - Follow Up Assessment and Electrolyte Replacement  Pharmacy consulted to assist in monitoring and replacing electrolytes in this 81 y.o. male admitted on 10/27/2021 undergoing dofetilide initiation. First dofetilide dose: 10/27/21  Labs:    Component Value Date/Time   K 4.2 10/29/2021 0202   MG 2.1 10/29/2021 0202     Plan: Potassium: K >/= 4: No additional supplementation needed  Magnesium: Mg > 2: No additional supplementation needed   Thank you for allowing pharmacy to participate in this patient's care   Arrie Senate, PharmD, BCPS, [facility] Clinical Pharmacist [phone] Please check AMION for all [place] numbers 10/29/2021

## 2021-10-29 NOTE — Anesthesia Postprocedure Evaluation (Signed)
Anesthesia Post Note  Patient: Frederick Vasquez  Procedure(s) Performed: CARDIOVERSION     Patient location during evaluation: PACU Anesthesia Type: General Level of consciousness: sedated Pain management: pain level controlled Vital Signs Assessment: post-procedure vital signs reviewed and stable Respiratory status: spontaneous breathing and respiratory function stable Cardiovascular status: stable Postop Assessment: no apparent nausea or vomiting Anesthetic complications: no   No notable events documented.  Last Vitals:  Vitals:   10/29/21 0836 10/29/21 0845  BP: (!) 126/59 124/74  Pulse: 78 79  Resp: 13 13  Temp:    SpO2: 99% 98%    Last Pain:  Vitals:   10/29/21 0845  TempSrc:   PainSc: 0-No pain                 Alwyn Cordner,Fabyan DANIEL

## 2021-10-29 NOTE — Care Management (Signed)
10-29-21 1530 Case Manager spoke with patient regarding Tikosyn cost. Patient is agreeable to cost and wants the initial Rx filled via [facility]. Rx refills to be sent to  [facility] in [place]. No further needs from Case Manager at this time.

## 2021-10-29 NOTE — Addendum Note (Signed)
Addendum  created 10/29/21 1005 by Duane Boston, MD   Clinical Note Signed

## 2021-10-29 NOTE — Transfer of Care (Signed)
Immediate Anesthesia Transfer of Care Note  Patient: Frederick Vasquez  Procedure(s) Performed: CARDIOVERSION  Patient Location: PACU and Endoscopy Unit  Anesthesia Type:General  Level of Consciousness: awake, alert  and oriented  Airway & Oxygen Therapy: Patient Spontanous Breathing  Post-op Assessment: Report given to RN and Post -op Vital signs reviewed and stable  Post vital signs: Reviewed and stable  Last Vitals:  Vitals Value Taken Time  BP 112/56   Temp    Pulse 70   Resp 14   SpO2 100     Last Pain:  Vitals:   10/29/21 0720  TempSrc: Temporal  PainSc: 0-No pain      Patients Stated Pain Goal: 0 (03/19/24 3664)  Complications: No notable events documented.

## 2021-10-29 NOTE — Interval H&P Note (Signed)
History and Physical Interval Note:  10/29/2021 7:51 AM  Frederick Vasquez  has presented today for surgery, with the diagnosis of A flutter.  The various methods of treatment have been discussed with the patient and family. After consideration of risks, benefits and other options for treatment, the patient has consented to  Procedure(s): CARDIOVERSION (N/A) as a surgical intervention.  The patient's history has been reviewed, patient examined, no change in status, stable for surgery.  I have reviewed the patient's chart and labs.  Questions were answered to the patient's satisfaction.     [name]

## 2021-10-30 ENCOUNTER — Other Ambulatory Visit (HOSPITAL_COMMUNITY): Payer: Self-pay

## 2021-10-30 DIAGNOSIS — I4819 Other persistent atrial fibrillation: Secondary | ICD-10-CM | POA: Diagnosis not present

## 2021-10-30 LAB — BASIC METABOLIC PANEL
Anion gap: 12 (ref 5–15)
BUN: 16 mg/dL (ref 8–23)
CO2: 21 mmol/L — ABNORMAL LOW (ref 22–32)
Calcium: 9 mg/dL (ref 8.9–10.3)
Chloride: 102 mmol/L (ref 98–111)
Creatinine, Ser: 0.83 mg/dL (ref 0.61–1.24)
GFR, Estimated: >60 mL/min (ref >60)
Glucose, Bld: 97 mg/dL (ref 70–99)
Potassium: 4 mmol/L (ref 3.5–5.1)
Sodium: 135 mmol/L (ref 135–145)

## 2021-10-30 LAB — MAGNESIUM: Magnesium: 2 mg/dL (ref 1.7–2.4)

## 2021-10-30 MED ORDER — DOFETILIDE 500 MCG PO CAPS
500.0000 ug | ORAL_CAPSULE | Freq: Two times a day (BID) | ORAL | 5 refills | Status: DC
Start: 2021-10-30 — End: 2021-11-05
  Filled 2021-10-30: qty 60, 30d supply, fill #0

## 2021-10-30 MED ORDER — MAGNESIUM SULFATE 2 GM/50ML IV SOLN
2.0000 g | Freq: Once | INTRAVENOUS | Status: AC
  Administered 2021-10-30: 2 g via INTRAVENOUS
  Filled 2021-10-30: qty 50

## 2021-10-30 NOTE — Discharge Summary (Addendum)
ELECTROPHYSIOLOGY PROCEDURE DISCHARGE SUMMARY    Patient ID: Frederick Vasquez,  MRN: [id number], DOB/AGE: Apr 29, 1941 81 y.o.  Admit date: 10/27/2021 Discharge date: 10/30/2021  Primary Care Physician: Algis Greenhouse, MD  Primary Cardiologist: Dr. Agustin Cree Electrophysiologist: Dr. Curt Bears  Primary Discharge Diagnosis:  1.  Persistent atrial fibrillation status post Tikosyn loading this admission      CHA2DS2Vasc is 3, on Xarelto  Secondary Discharge Diagnosis:  VHD Hx of TAVR 2019 HTN Symptomatic bradycardia w/PPM  Allergies  Allergen Reactions   Sulfamethoxazole-Trimethoprim Hives    06/17/2020   Allopurinol Rash   Atorvastatin Rash     Procedures This Admission:  1.  Tikosyn loading 2.  Direct current cardioversion on 10/29/21 by Dr Johney Frame which successfully restored SR.  There were no early apparent complications.   Brief HPI: Frederick Vasquez is a 81 y.o. male with a past medical history as noted above.  They were referred to EP in the outpatient setting for treatment options of atrial fibrillation.  Risks, benefits, and alternatives to Tikosyn were reviewed with the patient who wished to proceed.    Hospital Course:  The patient was admitted and Tikosyn was initiated.  Renal function and electrolytes were followed during the hospitalization.   The patient's QTc remained stable.  On 10/30/21 the patient underwent direct current cardioversion which restored sinus rhythm.  He was monitored until discharge on telemetry which demonstrated SR/V pacing post DCCV.  On the day of discharge, feels well, was examined by Dr Curt Bears who considered the patient stable for discharge to home.  Follow-up has been arranged with the AFib clinic in 1 week and with EP service in 4 weeks.   Tikosyn teaching was completed No new or additional electrolyte replacement for home  Physical Exam: Vitals:   10/29/21 0905 10/29/21 1419 10/29/21 2030 10/30/21 0459  BP: 130/87 (!) 141/77 131/69 130/61   Pulse: 96 87 86 66  Resp: '14 16 18 16  '$ Temp:  98.4 F (36.9 C) 97.6 F (36.4 C) 98.9 F (37.2 C)  TempSrc:  Oral Oral Oral  SpO2: 98% 98% 95% 95%  Weight:      Height:         GEN- The patient is well appearing, alert and oriented x 3 today.   HEENT: normocephalic, atraumatic; sclera clear, conjunctiva pink; hearing intact; oropharynx clear; neck supple, no JVP Lymph- no cervical lymphadenopathy Lungs- CTA b/l, normal work of breathing.  No wheezes, rales, rhonchi Heart- RRR, no murmurs, rubs or gallops, PMI not laterally displaced GI- soft, non-tender, non-distended Extremities- no clubbing, cyanosis, or edema MS- no significant deformity or atrophy Skin- warm and dry, no rash or lesion Psych- euthymic mood, full affect Neuro- HOH otherwise, nonfocal   Labs:   Lab Results  Component Value Date   WBC 4.6 09/10/2019   HGB 13.2 09/10/2019   HCT 38.4 09/10/2019   MCV 98 (H) 09/10/2019   PLT 94 (LL) 09/10/2019    Recent Labs  Lab 10/30/21 0647  NA 135  K 4.0  CL 102  CO2 21*  BUN 16  CREATININE 0.83  CALCIUM 9.0  GLUCOSE 97     Discharge Medications:  Allergies as of 10/30/2021       Reactions   Sulfamethoxazole-trimethoprim Hives   06/17/2020   Allopurinol Rash   Atorvastatin Rash        Medication List     TAKE these medications    acetaminophen 500 MG tablet Commonly known as:  TYLENOL Take 1,000 mg by mouth every 6 (six) hours as needed for moderate pain or headache.   amLODipine 5 MG tablet Commonly known as: NORVASC Take 1 tablet (5 mg total) by mouth daily.   dofetilide 500 MCG capsule Commonly known as: TIKOSYN Take 1 capsule (500 mcg total) by mouth 2 (two) times daily.   levothyroxine 88 MCG tablet Commonly known as: SYNTHROID Take 88 mcg by mouth daily before breakfast.   metoprolol tartrate 50 MG tablet Commonly known as: LOPRESSOR TAKE 1 TABLET(50 MG) BY MOUTH TWICE DAILY   nitroGLYCERIN 0.4 MG SL tablet Commonly known as:  NITROSTAT Place 0.4 mg under the tongue every 5 (five) minutes as needed for chest pain.   pantoprazole 40 MG tablet Commonly known as: PROTONIX Take 1 tablet by mouth daily.   potassium chloride SA 20 MEQ tablet Commonly known as: KLOR-CON M Take 1 tablet (20 mEq total) by mouth daily.   PreserVision AREDS 2+Multi Vit Caps Take 2 capsules by mouth every morning.   rosuvastatin 40 MG tablet Commonly known as: CRESTOR Take 40 mg by mouth daily.   Xarelto 20 MG Tabs tablet Generic drug: rivaroxaban TAKE 1 TABLET(20 MG) BY MOUTH DAILY WITH SUPPER        Disposition: Home Discharge Instructions     Diet - low sodium heart healthy   Complete by: As directed    Increase activity slowly   Complete by: As directed         Duration of Discharge Encounter: Greater than 30 minutes including physician time.  Venetia Night, PA-C 10/30/2021 10:42 AM  I have seen and examined this patient with Tommye Standard.  Agree with above, note added to reflect my findings.  Admitted for dofetilide load.  Required cardioversion.  Remains in sinus rhythm with ventricular pacing.  GEN: Well nourished, well developed, in no acute distress  HEENT: normal  Neck: no JVD, carotid bruits, or masses Cardiac: RRR; no murmurs, rubs, or gallops,no edema  Respiratory:  clear to auscultation bilaterally, normal work of breathing GI: soft, nontender, nondistended, + BS MS: no deformity or atrophy  Skin: warm and dry, device site well healed Neuro:  Strength and sensation are intact Psych: euthymic mood, full affect   Persistent atrial fibrillation: Status post dofetilide load.  Required cardioversion.  Remains in sinus rhythm.  Feeling well.  Discharge today with follow-up in clinic.  Cherokee Clowers M. Novalyn Lajara MD 10/30/2021 12:51 PM

## 2021-10-30 NOTE — Care Management Important Message (Signed)
Important Message  Patient Details  Name: Frederick Vasquez MRN: [id number] Date of Birth: 06-27-1940   Medicare Important Message Given:  Yes     Shelda Altes 10/30/2021, 9:27 AM

## 2021-10-30 NOTE — Progress Notes (Signed)
Pharmacy: Dofetilide (Tikosyn) - Follow Up Assessment and Electrolyte Replacement  Pharmacy consulted to assist in monitoring and replacing electrolytes in this 81 y.o. male admitted on 10/27/2021 undergoing dofetilide initiation. First dofetilide dose: 10/27/21  Labs:    Component Value Date/Time   K 4.0 10/30/2021 0647   MG 2.0 10/30/2021 0647     Plan: Potassium: K >/= 4: No additional supplementation needed  Magnesium: Mg 1.8-2: Give Mg 2 gm IV x1    Thank you for allowing pharmacy to participate in this patient's care   Arrie Senate, PharmD, BCPS, [facility] Clinical Pharmacist [phone] Please check AMION for all [place] numbers 10/30/2021

## 2021-11-05 ENCOUNTER — Ambulatory Visit (HOSPITAL_COMMUNITY)
Admission: RE | Admit: 2021-11-05 | Discharge: 2021-11-05 | Disposition: A | Discharge status: Home / Self Care | Payer: Medicare Other | Source: Ambulatory Visit | Attending: Physician Assistant | Admitting: Physician Assistant

## 2021-11-05 ENCOUNTER — Encounter (HOSPITAL_COMMUNITY): Payer: Self-pay | Admitting: Physician Assistant

## 2021-11-05 VITALS — BP 130/72 | HR 86 | Ht 70.0 in | Wt 246.0 lb

## 2021-11-05 DIAGNOSIS — I4819 Other persistent atrial fibrillation: Secondary | ICD-10-CM | POA: Diagnosis not present

## 2021-11-05 DIAGNOSIS — I350 Nonrheumatic aortic (valve) stenosis: Secondary | ICD-10-CM | POA: Diagnosis not present

## 2021-11-05 DIAGNOSIS — Z87891 Personal history of nicotine dependence: Secondary | ICD-10-CM | POA: Insufficient documentation

## 2021-11-05 DIAGNOSIS — Z8249 Family history of ischemic heart disease and other diseases of the circulatory system: Secondary | ICD-10-CM | POA: Diagnosis not present

## 2021-11-05 DIAGNOSIS — D6869 Other thrombophilia: Secondary | ICD-10-CM

## 2021-11-05 DIAGNOSIS — I10 Essential (primary) hypertension: Secondary | ICD-10-CM | POA: Insufficient documentation

## 2021-11-05 DIAGNOSIS — Z952 Presence of prosthetic heart valve: Secondary | ICD-10-CM | POA: Diagnosis not present

## 2021-11-05 DIAGNOSIS — Z950 Presence of cardiac pacemaker: Secondary | ICD-10-CM | POA: Insufficient documentation

## 2021-11-05 DIAGNOSIS — Z7901 Long term (current) use of anticoagulants: Secondary | ICD-10-CM | POA: Diagnosis not present

## 2021-11-05 DIAGNOSIS — E785 Hyperlipidemia, unspecified: Secondary | ICD-10-CM | POA: Insufficient documentation

## 2021-11-05 DIAGNOSIS — Z79899 Other long term (current) drug therapy: Secondary | ICD-10-CM | POA: Diagnosis not present

## 2021-11-05 LAB — BASIC METABOLIC PANEL
Anion gap: 10 (ref 5–15)
BUN: 13 mg/dL (ref 8–23)
CO2: 21 mmol/L — ABNORMAL LOW (ref 22–32)
Calcium: 9 mg/dL (ref 8.9–10.3)
Chloride: 104 mmol/L (ref 98–111)
Creatinine, Ser: 0.84 mg/dL (ref 0.61–1.24)
GFR, Estimated: >60 mL/min (ref >60)
Glucose, Bld: 132 mg/dL — ABNORMAL HIGH (ref 70–99)
Potassium: 4.3 mmol/L (ref 3.5–5.1)
Sodium: 135 mmol/L (ref 135–145)

## 2021-11-05 LAB — MAGNESIUM: Magnesium: 1.9 mg/dL (ref 1.7–2.4)

## 2021-11-05 MED ORDER — DOFETILIDE 500 MCG PO CAPS: 500.0000 ug | ORAL_CAPSULE | Freq: Two times a day (BID) | ORAL | 5 refills | Status: DC

## 2021-11-05 NOTE — Progress Notes (Signed)
Primary Care Physician: Algis Greenhouse, MD Referring Physician: Dr. Agustin Cree  EP: Dr. Raynald Blend Frederick Vasquez is a 81 y.o. male with a h/o  severe aortic stenosis, status post TAVR done in 2019 with 26 [place] 3 valve, paroxysmal atrial fibrillation, dual-chamber pacemaker, essential hypertension, dyslipidemia, chronic back problems.  Recently he started having  difficulty with balance and some falls.  He also was noted to have atrial fibrillation on the pacemaker interrogation since around March to April time frame.  Also complained of having some atypical chest pain, end up going to the emergency room. Visit to  the emergency room was unrevealing.  No evidence of MI, however noted to  be in atrial fib. He had used amiodarone  in the past but stopped in 2021 due to toxicities involving  the thyroid and liver.   He is in the afib clinic today to discuss Tikosyn admit to restore SR.  He is very hard of hearing. His wife and daughter  are with him today. He feels poorly in afib with lightheadedness and fatigue. He did miss a dose of xarelto last Thursday pm. He was also on plaquenil for lupus but stopped 5/13 as he did not want potential SE's of drug.   Follow up in the AF clinic 10/27/21. Patient presents for dofetilide loading today. He denies any missed doses of anticoagulation in the past 3 weeks. He denies any palpitations but does have lightheadedness at times.   Follow up in the AF clinic 11/05/21. Patient is s/p dofetilide admission 6/6-10/30/21 with DCCV on 10/29/21. Patient remains in AV dual paced rhythm today. He states that he feels much better in SR with more energy. He was able to mow his lawn this morning. No bleeding issues on anticoagulation.   Today, he denies symptoms of palpitations, chest pain, shortness of breath, orthopnea, PND, lower extremity edema, presyncope, syncope, or neurologic sequela. The patient is tolerating medications without difficulties and is otherwise  without complaint today.   Past Medical History:  Diagnosis Date   Acute prostatitis 06/06/2020   Formatting of this note might be different from the original. 06/06/2020   Aortic stenosis 05/15/2017   Severe by echo from October 2019  Formatting of this note might be different from the original. Formatting of this note might be different from the original. 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 Chest CT 01/18/2019 2019: USN exposure 2019: CXR: IMPRESSION: 1. 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 2. No active infiltrate or effusion. 2019: PULM eval   Atrial fibrillation ([place])    Xarelto   Balanitis 04/10/2020   Formatting of this note might be different from the original. 04/10/2020   Chronic anticoagulation 06/14/2017   CHADS VASC=4   Chronic diastolic CHF (congestive heart failure) ([place]) 05/15/2017   Contact with and (suspected) exposure to asbestos 11/17/2017   Formatting of this note might be different from the original. 1961-64, [place] room with pipe insulation contact and movement 2018: pleural plaque on CT   Diarrhea of presumed infectious origin 09/25/2019   Formatting of this note might be different from the original. 1308   Diastolic heart failure ([place])    Dyslipidemia 11/19/2014   Dysrhythmia    A- Fib   Elevated troponin 05/15/2017   Encounter  for administration of vaccine 11/19/2014   Essential hypertension 05/15/2017   Exacerbation of chronic bronchiolitis ([place]) 02/28/2020   Formatting of this note might be different from the original. 02/28/2020   Gastrointestinal hemorrhage 09/25/2019   Formatting of this note might be different from the original. 2021   Hemoptysis 05/15/2017   Formatting of this note might be different from the original. 2019   History of aortic  valvular stenosis 02/26/2016   Last Assessment & Plan:  Formatting of this note might be different from the original. Moderate.   History of gout 08/27/2015   Hyperlipidemia    Hypertension    Hypothyroidism 09/13/2019   Formatting of this note might be different from the original. 2021: TSH 41   Impaired glucose tolerance    Lung nodule 01/25/2019   Formatting of this note is different from the original. Chest CT 01/18/2019 notes a stable 4 mm right middle lobe lung nodule- recheck chest CT August 2021   Lupus [facility])    Normal coronary arteries 06/14/2017   2014 [facility] in [facility]   Pacemaker    Paroxysmal atrial fibrillation ([place]) 11/19/2014   Xarelto Formatting of this note might be different from the original. Managed CARDS  Formatting of this note might be different from the original. 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 05/2020: ORTHO, myelogram, surg rec   Suspected COVID-19 virus infection 09/25/2019   Formatting of this note might be different from the original. 2021   Thrombocytopenia ([place]) 05/15/2017   Type 2 diabetes mellitus without complication, without long-term current use of insulin ([place]) 08/27/2015   Formatting of this note might be different from the original. 2019: 99/5.3 2020: 125/5.7 2021: 126/6.6   Past  Surgical History:  Procedure Laterality Date   CARDIAC CATHETERIZATION  2014   non-obs dz, done at [place] N/A 09/02/2017   Procedure: CARDIOVERSION;  Surgeon: Lelon Perla, MD;  Location: [place];  Service: Cardiovascular;  Laterality: N/A;   CARDIOVERSION N/A 10/29/2021   Procedure: CARDIOVERSION;  Surgeon: [name], MD;  Location: [place];  Service: Cardiovascular;  Laterality: N/A;   COLONOSCOPY     EYE SURGERY     bilateral cataracts   PACEMAKER IMPLANT  03/2012   REPLACEMENT TOTAL KNEE Left    RIGHT/LEFT HEART CATH AND CORONARY ANGIOGRAPHY N/A 03/28/2018   Procedure: RIGHT/LEFT HEART CATH AND CORONARY ANGIOGRAPHY;  Surgeon: Belva Crome, MD;  Location: [place] CV LAB;  Service: Cardiovascular;  Laterality: N/A;   TEE WITHOUT CARDIOVERSION N/A 05/12/2018   Procedure: TRANSESOPHAGEAL ECHOCARDIOGRAM (TEE);  Surgeon: Sherren Mocha, MD;  Location: [place];  Service: Open Heart Surgery;  Laterality: N/A;   TRANSCATHETER AORTIC VALVE REPLACEMENT, TRANSFEMORAL N/A 05/12/2018   Procedure: TRANSCATHETER AORTIC VALVE REPLACEMENT, TRANSFEMORAL;  Surgeon: Sherren Mocha, MD;  Location: [place];  Service: Open Heart Surgery;  Laterality: N/A;   VASECTOMY      Current Outpatient Medications  Medication Sig Dispense Refill   acetaminophen (TYLENOL) 500 MG tablet Take 1,000 mg by mouth every 6 (six) hours as needed for moderate pain or headache.     amLODipine (NORVASC) 5 MG tablet Take 1 tablet (5 mg total) by mouth daily. 90 tablet 3   levothyroxine (SYNTHROID) 88 MCG tablet Take 88 mcg by mouth daily before breakfast.     metoprolol tartrate (LOPRESSOR) 50 MG tablet TAKE 1 TABLET(50 MG) BY MOUTH TWICE DAILY 180 tablet 2   Multiple Vitamins-Minerals (PRESERVISION AREDS 2+MULTI VIT) CAPS Take 2 capsules by mouth every morning.     nitroGLYCERIN (NITROSTAT) 0.4 MG SL tablet Place 0.4 mg under the tongue every 5 (five) minutes as needed for chest pain.      pantoprazole (PROTONIX) 40 MG tablet Take 1 tablet by mouth daily.     potassium chloride SA (KLOR-CON M) 20 MEQ tablet Take 1 tablet (20 mEq total) by mouth daily. 30 tablet 3   rosuvastatin (CRESTOR) 40 MG tablet Take 40 mg by mouth daily.     XARELTO 20 MG TABS tablet TAKE 1 TABLET(20 MG) BY MOUTH DAILY WITH SUPPER 90 tablet 1   dofetilide (TIKOSYN) 500 MCG capsule Take 1 capsule (500 mcg total) by mouth 2 (two) times daily. 60 capsule 5   No current facility-administered medications for this encounter.    Allergies  Allergen Reactions   Sulfamethoxazole-Trimethoprim Hives    06/17/2020   Allopurinol Rash   Atorvastatin Rash    Social History   Socioeconomic History   Marital status: Married    Spouse name: Not on file   Number of children: Not on file   Years of education: Not on file   Highest education level: Not on file  Occupational History   Occupation: retired  Tobacco Use   Smoking status: Former    Types: Cigarettes    Quit date: 1978    Years since quitting: 45.4   Smokeless tobacco: Never   Tobacco comments:    Former smoker 10/27/21  Vaping Use   Vaping Use: Never used  Substance and Sexual Activity   Alcohol use: No   Drug use: No   Sexual activity: Not on file  Other Topics Concern   Not on file  Social History Narrative   Pt lives in [place] Determinants of Health   Financial Resource Strain: Not on file  Food Insecurity: Not on file  Transportation Needs: Not on file  Physical Activity: Not on file  Stress: Not on file  Social Connections: Not on file  Intimate Partner Violence: Not on file    Family History  Problem Relation Age of Onset   CAD Father    Stroke Father    Colon cancer Sister     ROS- All systems are reviewed and negative except as per the HPI above  Physical Exam: Vitals:   11/05/21 1418  BP: 130/72  Pulse: 86  Weight: 111.6 kg  Height: '5\' 10"'$  (1.778 m)     Wt Readings from Last 3 Encounters:   11/05/21 111.6 kg  10/28/21 112.1 kg  10/27/21 112.1 kg    Labs: Lab Results  Component Value Date   NA 135 10/30/2021   K 4.0 10/30/2021   CL 102 10/30/2021   CO2 21 (L) 10/30/2021   GLUCOSE 97 10/30/2021   BUN 16 10/30/2021   CREATININE  0.83 10/30/2021   CALCIUM 9.0 10/30/2021   MG 2.0 10/30/2021   Lab Results  Component Value Date   INR 1.15 05/10/2018   No results found for: "CHOL", "HDL", "LDLCALC", "TRIG"   GEN- The patient is a well appearing elderly obese male, alert and oriented x 3 today.   HEENT-head normocephalic, atraumatic, sclera clear, conjunctiva pink, hearing intact, trachea midline. Lungs- Clear to ausculation bilaterally, normal work of breathing Heart- Regular rate and rhythm, no murmurs, rubs or gallops  GI- soft, NT, ND, + BS Extremities- no clubbing, cyanosis, or edema MS- no significant deformity or atrophy Skin- no rash or lesion Psych- euthymic mood, full affect Neuro- strength and sensation are intact   Echo-  1. Left ventricular ejection fraction, by estimation, is 60 to 65%. The left ventricle has normal function. The left ventricle has no regional wall motion abnormalities. There is moderate left ventricular hypertrophy. Left ventricular diastolic parameters are indeterminate.   2. Right ventricular systolic function is normal. The right ventricular size is normal. There is normal pulmonary artery systolic pressure.   3. Left atrial size was severely dilated.   4. The mitral valve is normal in structure. Mild mitral valve  regurgitation. Mild mitral stenosis. Moderate mitral annular  calcification.   5. TAVR satisfactory function . The aortic valve was not well visualized.  There is a 26 mm Sapien prosthetic (TAVR) valve present in the aortic position. Gradients mentioned below.   6. The inferior vena cava is normal in size with greater than 50% respiratory variability, suggesting right atrial pressure of 3 mmHg.    EKG- AV dual pacing   Vent. rate 86 BPM PR interval * ms QRS duration 204 ms QT/QTcB 466/557 ms   Assessment and Plan:  1. Persistent Afib  Failed amiodarone 2021 due to thyroid/liver issues  S/p dofetilide admission 6/6-10/30/21 with DCCV on 10/29/21 Patient appears to be maintaining SR. Continue dofetilide 500 mcg BID. QT stable when corrected for wide paced QRS. Continue Xarelto 20 mg daily Check bmet/mag Continue Lopressor 50 mg BID  2. CHA2DS2VASc  score of at least 6  Continue Xarelto 20 mg daily  3. Aortic stenosis S/p TAVR  4. HTN Stable, no changes today.   Follow up with Dr Curt Bears as scheduled.    [facility] [address] [place], [place] [address] [phone]

## 2021-11-06 ENCOUNTER — Ambulatory Visit: Payer: Medicare Other | Admitting: Cardiology

## 2021-11-22 ENCOUNTER — Other Ambulatory Visit: Payer: Self-pay | Admitting: Cardiology

## 2021-11-23 ENCOUNTER — Other Ambulatory Visit: Payer: Self-pay | Admitting: Cardiology

## 2021-12-07 ENCOUNTER — Ambulatory Visit (INDEPENDENT_AMBULATORY_CARE_PROVIDER_SITE_OTHER): Payer: Medicare Other

## 2021-12-07 DIAGNOSIS — I442 Atrioventricular block, complete: Secondary | ICD-10-CM | POA: Diagnosis not present

## 2021-12-09 LAB — CUP PACEART REMOTE DEVICE CHECK
Battery Impedance: 1926 Ohm
Battery Remaining Longevity: 34 mo
Battery Voltage: 2.75 V
Brady Statistic AP VP Percent: 32 %
Brady Statistic AP VS Percent: 0 %
Brady Statistic AS VP Percent: 68 %
Brady Statistic AS VS Percent: 0 %
Date Time Interrogation Session: 20230717073956
Implantable Lead Implant Date: 20131113
Implantable Lead Implant Date: 20131113
Implantable Lead Location: 753859
Implantable Lead Location: 753860
Implantable Lead Model: 5076
Implantable Lead Model: 5092
Implantable Pulse Generator Implant Date: 20131113
Lead Channel Impedance Value: 366 Ohm
Lead Channel Impedance Value: 660 Ohm
Lead Channel Pacing Threshold Amplitude: 0.5 V
Lead Channel Pacing Threshold Amplitude: 0.625 V
Lead Channel Pacing Threshold Pulse Width: 0.4 ms
Lead Channel Pacing Threshold Pulse Width: 0.4 ms
Lead Channel Setting Pacing Amplitude: 2 V
Lead Channel Setting Pacing Amplitude: 2.5 V
Lead Channel Setting Pacing Pulse Width: 0.4 ms
Lead Channel Setting Sensing Sensitivity: 4 mV

## 2021-12-14 ENCOUNTER — Ambulatory Visit: Payer: Medicare Other | Admitting: Cardiology

## 2021-12-21 ENCOUNTER — Ambulatory Visit (INDEPENDENT_AMBULATORY_CARE_PROVIDER_SITE_OTHER): Payer: Medicare Other | Admitting: Cardiology

## 2021-12-21 ENCOUNTER — Encounter: Payer: Self-pay | Admitting: Cardiology

## 2021-12-21 VITALS — BP 124/62 | HR 84 | Ht 70.0 in | Wt 247.0 lb

## 2021-12-21 DIAGNOSIS — Z952 Presence of prosthetic heart valve: Secondary | ICD-10-CM

## 2021-12-21 DIAGNOSIS — I480 Paroxysmal atrial fibrillation: Secondary | ICD-10-CM | POA: Diagnosis not present

## 2021-12-21 DIAGNOSIS — E785 Hyperlipidemia, unspecified: Secondary | ICD-10-CM | POA: Diagnosis not present

## 2021-12-21 DIAGNOSIS — I10 Essential (primary) hypertension: Secondary | ICD-10-CM | POA: Diagnosis not present

## 2021-12-21 NOTE — Patient Instructions (Signed)

## 2021-12-21 NOTE — Progress Notes (Unsigned)
Cardiology Office Note:    Date:  12/21/2021   ID:  Frederick Vasquez, DOB November 16, 1940, MRN [id number]  PCP:  Algis Greenhouse, MD  Cardiologist:  [name], MD    Referring MD: Algis Greenhouse, MD   Chief Complaint  Patient presents with   Follow-up  Doing much better  History of Present Illness:    Frederick Vasquez is a 81 y.o. male  with past medical history significant for severe aortic stenosis, status post TAVI done in 2019 with 26 [place] 3 valve, paroxysmal atrial fibrillation, dual-chamber pacemaker, essential hypertension, dyslipidemia, chronic back problem. Last time I have seen him he was back in atrial fibrillation.  He was referred to our EP team, decision has been made to loaded with Tikosyn.  He was put in the hospital loaded with Tikosyn and few weeks later he have direct cardioversion performed.  He seems to be maintaining sinus rhythm since then.  I did review interrogation of his device pacemaker which show me no episode of atrial fibrillation after cardioversion.  He is feeling better.  Denies have any chest pain tightness squeezing pressure burning chest no dizziness no passing out.  Sometimes shortness of breath but as usual.  Past Medical History:  Diagnosis Date   Acute prostatitis 06/06/2020   Formatting of this note might be different from the original. 06/06/2020   Aortic stenosis 05/15/2017   Severe by echo from October 2019  Formatting of this note might be different from the original. Formatting of this note might be different from the original. 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 Chest CT 01/18/2019 2019: USN exposure 2019: CXR: IMPRESSION: 1. 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 2. No active infiltrate or effusion. 2019: PULM eval   Atrial fibrillation  ([place])    Xarelto   Balanitis 04/10/2020   Formatting of this note might be different from the original. 04/10/2020   Chronic anticoagulation 06/14/2017   CHADS VASC=4   Chronic diastolic CHF (congestive heart failure) ([place]) 05/15/2017   Contact with and (suspected) exposure to asbestos 11/17/2017   Formatting of this note might be different from the original. 1961-64, [place] room with pipe insulation contact and movement 2018: pleural plaque on CT   Diarrhea of presumed infectious origin 09/25/2019   Formatting of this note might be different from the original. 1638   Diastolic heart failure ([place])    Dyslipidemia 11/19/2014   Dysrhythmia    A- Fib   Elevated troponin 05/15/2017   Encounter for administration of vaccine 11/19/2014   Essential hypertension 05/15/2017   Exacerbation of chronic bronchiolitis ([place]) 02/28/2020   Formatting of this note might be different from the original. 02/28/2020   Gastrointestinal hemorrhage 09/25/2019   Formatting of this note might be different from the original. 2021   Hemoptysis 05/15/2017   Formatting of this note might be different from the original. 2019   History of aortic valvular stenosis 02/26/2016   Last Assessment & Plan:  Formatting of this note might be different from the original. Moderate.   History of gout 08/27/2015   Hyperlipidemia    Hypertension    Hypothyroidism 09/13/2019   Formatting of this note might be different from the original. 2021: TSH 41   Impaired glucose tolerance  Lung nodule 01/25/2019   Formatting of this note is different from the original. Chest CT 01/18/2019 notes a stable 4 mm right middle lobe lung nodule- recheck chest CT August 2021   Lupus [facility])    Normal coronary arteries 06/14/2017   2014 cath in [facility]   Pacemaker    Paroxysmal atrial fibrillation ([place]) 11/19/2014   Xarelto Formatting of this note might be different from the original. Managed CARDS  Formatting of this note might be different from  the original. 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 05/2020: ORTHO, myelogram, surg rec   Suspected COVID-19 virus infection 09/25/2019   Formatting of this note might be different from the original. 2021   Thrombocytopenia ([place]) 05/15/2017   Type 2 diabetes mellitus without complication, without long-term current use of insulin ([place]) 08/27/2015   Formatting of this note might be different from the original. 2019: 99/5.3 2020: 125/5.7 2021: 126/6.6    Past Surgical History:  Procedure Laterality Date   CARDIAC CATHETERIZATION  2014   non-obs dz, done at [place] N/A 09/02/2017   Procedure: CARDIOVERSION;  Surgeon: Lelon Perla, MD;  Location: [place];  Service: Cardiovascular;  Laterality: N/A;   CARDIOVERSION N/A 10/29/2021   Procedure: CARDIOVERSION;  Surgeon: [name], MD;  Location: [place];  Service: Cardiovascular;  Laterality: N/A;   COLONOSCOPY     EYE SURGERY     bilateral cataracts   PACEMAKER IMPLANT  03/2012   REPLACEMENT TOTAL KNEE Left    RIGHT/LEFT HEART CATH AND CORONARY ANGIOGRAPHY N/A 03/28/2018   Procedure: RIGHT/LEFT HEART CATH AND CORONARY ANGIOGRAPHY;  Surgeon: Belva Crome, MD;  Location: [place] CV LAB;  Service: Cardiovascular;  Laterality: N/A;    TEE WITHOUT CARDIOVERSION N/A 05/12/2018   Procedure: TRANSESOPHAGEAL ECHOCARDIOGRAM (TEE);  Surgeon: Sherren Mocha, MD;  Location: [place];  Service: Open Heart Surgery;  Laterality: N/A;   TRANSCATHETER AORTIC VALVE REPLACEMENT, TRANSFEMORAL N/A 05/12/2018   Procedure: TRANSCATHETER AORTIC VALVE REPLACEMENT, TRANSFEMORAL;  Surgeon: Sherren Mocha, MD;  Location: [place];  Service: Open Heart Surgery;  Laterality: N/A;   VASECTOMY      Current Medications: Current Meds  Medication Sig   acetaminophen (TYLENOL) 500 MG tablet Take 1,000 mg by mouth every 6 (six) hours as needed for moderate pain or headache.   amLODipine (NORVASC) 5 MG tablet Take 1 tablet (5 mg total) by mouth daily.   dofetilide (TIKOSYN) 500 MCG capsule Take 1 capsule (500 mcg total) by mouth 2 (two) times daily.   levothyroxine (SYNTHROID) 88 MCG tablet Take 88 mcg by mouth daily before breakfast.   metoprolol tartrate (LOPRESSOR) 50 MG tablet TAKE 1 TABLET(50 MG) BY MOUTH TWICE DAILY   Multiple Vitamins-Minerals (PRESERVISION AREDS 2+MULTI VIT) CAPS Take 2 capsules by mouth every morning.   nitroGLYCERIN (NITROSTAT) 0.4  MG SL tablet Place 0.4 mg under the tongue every 5 (five) minutes as needed for chest pain.   pantoprazole (PROTONIX) 40 MG tablet Take 1 tablet by mouth daily.   potassium chloride SA (KLOR-CON M) 20 MEQ tablet Take 1 tablet (20 mEq total) by mouth daily.   rosuvastatin (CRESTOR) 40 MG tablet Take 40 mg by mouth daily.   XARELTO 20 MG TABS tablet TAKE 1 TABLET(20 MG) BY MOUTH DAILY WITH SUPPER     Allergies:   Sulfamethoxazole-trimethoprim, Allopurinol, and Atorvastatin   Social History   Socioeconomic History   Marital status: Married    Spouse name: Not on file   Number of children: Not on file   Years of education: Not on file   Highest education level: Not on file  Occupational History   Occupation: retired  Tobacco Use   Smoking status: Former    Types: Cigarettes    Quit date: 1978     Years since quitting: 45.6   Smokeless tobacco: Never   Tobacco comments:    Former smoker 10/27/21  Vaping Use   Vaping Use: Never used  Substance and Sexual Activity   Alcohol use: No   Drug use: No   Sexual activity: Not on file  Other Topics Concern   Not on file  Social History Narrative   Pt lives in [place] Determinants of Health   Financial Resource Strain: Not on file  Food Insecurity: Not on file  Transportation Needs: Not on file  Physical Activity: Not on file  Stress: Not on file  Social Connections: Not on file     Family History: The patient's family history includes CAD in his father; Colon cancer in his sister; Stroke in his father. ROS:   Please see the history of present illness.    All 14 point review of systems negative except as described per history of present illness  EKGs/Labs/Other Studies Reviewed:      Recent Labs: 11/05/2021: BUN 13; Creatinine, Ser 0.84; Magnesium 1.9; Potassium 4.3; Sodium 135  Recent Lipid Panel No results found for: "CHOL", "TRIG", "HDL", "CHOLHDL", "VLDL", "LDLCALC", "LDLDIRECT"  Physical Exam:    VS:  BP 124/62 (BP Location: Left Arm, Patient Position: Sitting, Cuff Size: Normal)   Pulse 84   Ht '5\' 10"'$  (1.778 m)   Wt 247 lb (112 kg)   SpO2 97%   BMI 35.44 kg/m     Wt Readings from Last 3 Encounters:  12/21/21 247 lb (112 kg)  11/05/21 246 lb (111.6 kg)  10/28/21 247 lb 3.2 oz (112.1 kg)     GEN:  Well nourished, well developed in no acute distress HEENT: Normal NECK: No JVD; No carotid bruits LYMPHATICS: No lymphadenopathy CARDIAC: RRR, soft systolic murmur grade 1/6 best heard right upper portion of the sternum, no rubs, no gallops RESPIRATORY:  Clear to auscultation without rales, wheezing or rhonchi  ABDOMEN: Soft, non-tender, non-distended MUSCULOSKELETAL:  No edema; No deformity  SKIN: Warm and dry LOWER EXTREMITIES: no swelling NEUROLOGIC:  Alert and oriented x 3 PSYCHIATRIC:   Normal affect   ASSESSMENT:    1. S/P TAVR (transcatheter aortic valve replacement)   2. Dyslipidemia   3. Essential hypertension   4. Primary hypertension   5. Paroxysmal atrial fibrillation (HCC)    PLAN:    In order of problems listed above:  Status post TAVI.  Valve functioning properly.  We will repeat echocardiogram within next 6 months. Dyslipidemia, he is LDL 46 HDL  22, sadly he is triglycerides are still elevated.  He is taking Crestor 40 which I will continue. Paroxysmal atrial fibrillation, he is being loaded with Tikosyn.  He does have appoint with EP with next few days.  Denies have any dizziness good tolerance of Tikosyn.  He is maintaining sinus rhythm, continue anticoagulation with Xarelto.  Dose is appropriate to his GFR. Essential hypertension blood pressure well controlled continue present management.   Medication Adjustments/Labs and Tests Ordered: Current medicines are reviewed at length with the patient today.  Concerns regarding medicines are outlined above.  No orders of the defined types were placed in this encounter.  Medication changes: No orders of the defined types were placed in this encounter.   Signed, Park Liter, MD, [facility] 12/21/2021 11:40 AM    [place]

## 2021-12-28 ENCOUNTER — Ambulatory Visit (INDEPENDENT_AMBULATORY_CARE_PROVIDER_SITE_OTHER): Payer: Medicare Other | Admitting: Cardiology

## 2021-12-28 ENCOUNTER — Encounter: Payer: Self-pay | Admitting: Cardiology

## 2021-12-28 VITALS — BP 124/72 | HR 86 | Ht 70.0 in | Wt 250.4 lb

## 2021-12-28 DIAGNOSIS — Z950 Presence of cardiac pacemaker: Secondary | ICD-10-CM | POA: Diagnosis not present

## 2021-12-28 DIAGNOSIS — I442 Atrioventricular block, complete: Secondary | ICD-10-CM | POA: Diagnosis not present

## 2021-12-28 DIAGNOSIS — I4819 Other persistent atrial fibrillation: Secondary | ICD-10-CM | POA: Diagnosis not present

## 2021-12-28 LAB — CUP PACEART INCLINIC DEVICE CHECK
Battery Impedance: 2016 Ohm
Battery Remaining Longevity: 32 mo
Battery Voltage: 2.75 V
Brady Statistic AP VP Percent: 34 %
Brady Statistic AP VS Percent: 0 %
Brady Statistic AS VP Percent: 66 %
Brady Statistic AS VS Percent: 0 %
Date Time Interrogation Session: 20230807120540
Implantable Lead Implant Date: 20131113
Implantable Lead Implant Date: 20131113
Implantable Lead Location: 753859
Implantable Lead Location: 753860
Implantable Lead Model: 5076
Implantable Lead Model: 5092
Implantable Pulse Generator Implant Date: 20131113
Lead Channel Impedance Value: 379 Ohm
Lead Channel Impedance Value: 658 Ohm
Lead Channel Pacing Threshold Amplitude: 0.5 V
Lead Channel Pacing Threshold Amplitude: 0.625 V
Lead Channel Pacing Threshold Amplitude: 0.75 V
Lead Channel Pacing Threshold Amplitude: 0.75 V
Lead Channel Pacing Threshold Pulse Width: 0.4 ms
Lead Channel Pacing Threshold Pulse Width: 0.4 ms
Lead Channel Pacing Threshold Pulse Width: 0.4 ms
Lead Channel Pacing Threshold Pulse Width: 0.4 ms
Lead Channel Sensing Intrinsic Amplitude: 4 mV
Lead Channel Setting Pacing Amplitude: 2 V
Lead Channel Setting Pacing Amplitude: 2.5 V
Lead Channel Setting Pacing Pulse Width: 0.4 ms
Lead Channel Setting Sensing Sensitivity: 4 mV

## 2021-12-28 MED ORDER — METOPROLOL TARTRATE 50 MG PO TABS: 75.0000 mg | ORAL_TABLET | Freq: Two times a day (BID) | ORAL | 3 refills | Status: DC

## 2021-12-28 NOTE — Patient Instructions (Addendum)
Medication Instructions:  Your physician has recommended you make the following change in your medication:   ** Increase Metoprolol Tartrate to from '50mg'$  to '75mg'$  - 1.5 tablets by mouth twice daily   ** Please continue your Potassium    *If you need a refill on your cardiac medications before your next appointment, please call your pharmacy*   Lab Work: None ordered.  If you have labs (blood work) drawn today and your tests are completely normal, you will receive your results only by: [place] (if you have MyChart) OR A paper copy in the mail If you have any lab test that is abnormal or we need to change your treatment, we will call you to review the results.   Testing/Procedures: None ordered.    Follow-Up: At [facility], you and your health needs are our priority.  As part of our continuing mission to provide you with exceptional heart care, we have created designated Provider Care Teams.  These Care Teams include your primary Cardiologist (physician) and Advanced Practice Providers (APPs -  Physician Assistants and Nurse Practitioners) who all work together to provide you with the care you need, when you need it.  We recommend signing up for the patient portal called "MyChart".  Sign up information is provided on this After Visit Summary.  MyChart is used to connect with patients for Virtual Visits (Telemedicine).  Patients are able to view lab/test results, encounter notes, upcoming appointments, etc.  Non-urgent messages can be sent to your provider as well.   To learn more about what you can do with MyChart, go to [email or web address].    Your next appointment:   6 months with Dr Curt Bears  Important Information About Sugar

## 2021-12-28 NOTE — Progress Notes (Signed)
Electrophysiology Office Note   Date:  12/28/2021   ID:  Frederick Vasquez, Frederick Vasquez 13-Feb-1941, MRN [id number]  PCP:  Frederick Greenhouse, MD  Cardiologist:  Frederick Vasquez Primary Electrophysiologist:  Frederick Hamme Frederick Leeds, MD    No chief complaint on file.    History of Present Illness: Frederick Vasquez is a 81 y.o. male who is being seen today for the evaluation of atrial fibrillation, pacemaker at the request of [name]. Presenting today for electrophysiology evaluation.    He has a history significant for aortic stenosis post TAVR in 2019, paroxysmal atrial fibrillation, diastolic heart failure, hypertension, hyperlipidemia.  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  He is on dofetilide for his atrial fibrillation.  Today, denies symptoms of palpitations, chest pain, shortness of breath, orthopnea, PND, lower extremity edema, claudication, dizziness, presyncope, syncope, bleeding, or neurologic sequela. The patient is tolerating medications without difficulties.  He fortunately remains in sinus rhythm.  He feels quite well and has not had any further episodes of atrial fibrillation to his knowledge.  He is quite happy with his control.   Past Medical History:  Diagnosis Date   Acute prostatitis 06/06/2020   Formatting of this note might be different from the original. 06/06/2020   Aortic stenosis 05/15/2017   Severe by echo from October 2019  Formatting of this note might be different from the original. Formatting of this note might be different from the original. 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 Chest CT 01/18/2019 2019: USN exposure 2019: CXR: IMPRESSION: 1. 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 2. No active infiltrate or effusion. 2019: PULM eval    Atrial fibrillation ([place])    Xarelto   Balanitis 04/10/2020   Formatting of this note might be different from the original. 04/10/2020   Chronic anticoagulation 06/14/2017   CHADS VASC=4   Chronic diastolic CHF (congestive heart failure) ([place]) 05/15/2017   Contact with and (suspected) exposure to asbestos 11/17/2017   Formatting of this note might be different from the original. 1961-64, [place] room with pipe insulation contact and movement 2018: pleural plaque on CT   Diarrhea of presumed infectious origin 09/25/2019   Formatting of this note might be different from the original. 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 02/28/2020   Gastrointestinal hemorrhage 09/25/2019   Formatting of this note might be different from the original. 2021   Hemoptysis 05/15/2017   Formatting of this note might be different from the original. 2019   History of aortic valvular stenosis 02/26/2016   Last Assessment & Plan:  Formatting of this note might be different from the original. Moderate.   History of gout 08/27/2015   Hyperlipidemia    Hypertension    Hypothyroidism 09/13/2019   Formatting of this note might be different from the original. 2021: TSH 41   Impaired glucose tolerance    Lung nodule 01/25/2019   Formatting of this note is different from the original. Chest CT 01/18/2019 notes  a stable 4 mm right middle lobe lung nodule- recheck chest CT August 2021   Lupus [facility])    Normal coronary arteries 06/14/2017   2014 cath in [facility]   Pacemaker    Paroxysmal atrial fibrillation ([place]) 11/19/2014   Xarelto Formatting of this note might be different from the original. Managed CARDS  Formatting of this note  might be different from the original. 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 05/2020: ORTHO, myelogram, surg rec   Suspected COVID-19 virus infection 09/25/2019   Formatting of this note might be different from the original. 2021   Thrombocytopenia ([place]) 05/15/2017   Type 2 diabetes mellitus without complication, without long-term current use of insulin ([place]) 08/27/2015   Formatting of this note might be different from the original. 2019: 99/5.3 2020: 125/5.7 2021: 126/6.6   Past Surgical History:  Procedure Laterality Date   CARDIAC CATHETERIZATION  2014   non-obs dz, done at [place] N/A 09/02/2017   Procedure: CARDIOVERSION;  Surgeon: Lelon Perla, MD;  Location: [place];  Service: Cardiovascular;  Laterality: N/A;   CARDIOVERSION N/A 10/29/2021   Procedure: CARDIOVERSION;  Surgeon: [name], MD;  Location: [place];  Service: Cardiovascular;  Laterality: N/A;   COLONOSCOPY     EYE SURGERY     bilateral cataracts   PACEMAKER IMPLANT  03/2012   REPLACEMENT TOTAL KNEE Left    RIGHT/LEFT HEART CATH AND CORONARY ANGIOGRAPHY N/A 03/28/2018   Procedure: RIGHT/LEFT HEART CATH AND CORONARY ANGIOGRAPHY;  Surgeon: Belva Crome, MD;  Location: [place] CV LAB;  Service:  Cardiovascular;  Laterality: N/A;   TEE WITHOUT CARDIOVERSION N/A 05/12/2018   Procedure: TRANSESOPHAGEAL ECHOCARDIOGRAM (TEE);  Surgeon: Sherren Mocha, MD;  Location: [place];  Service: Open Heart Surgery;  Laterality: N/A;   TRANSCATHETER AORTIC VALVE REPLACEMENT, TRANSFEMORAL N/A 05/12/2018   Procedure: TRANSCATHETER AORTIC VALVE REPLACEMENT, TRANSFEMORAL;  Surgeon: Sherren Mocha, MD;  Location: [place];  Service: Open Heart Surgery;  Laterality: N/A;   VASECTOMY       Current Outpatient Medications  Medication Sig Dispense Refill   acetaminophen (TYLENOL) 500 MG tablet Take 1,000 mg by mouth every 6 (six) hours as needed for moderate pain or headache.     amLODipine (NORVASC) 5 MG tablet Take 1 tablet (5 mg total) by mouth daily. 90 tablet 3   dofetilide (TIKOSYN) 500 MCG capsule Take 1 capsule (500 mcg total) by mouth 2 (two) times daily. 60 capsule 5   levothyroxine (SYNTHROID) 88 MCG tablet Take 88 mcg by mouth daily before breakfast.     Multiple Vitamins-Minerals (PRESERVISION AREDS 2+MULTI VIT) CAPS Take 2 capsules by mouth every morning.     nitroGLYCERIN (NITROSTAT) 0.4 MG SL tablet Place 0.4 mg under the tongue every 5 (five) minutes as needed for chest pain.  pantoprazole (PROTONIX) 40 MG tablet Take 1 tablet by mouth daily.     potassium chloride SA (KLOR-CON M) 20 MEQ tablet Take 1 tablet (20 mEq total) by mouth daily. 30 tablet 3   rosuvastatin (CRESTOR) 40 MG tablet Take 40 mg by mouth daily.     XARELTO 20 MG TABS tablet TAKE 1 TABLET(20 MG) BY MOUTH DAILY WITH SUPPER 90 tablet 1   metoprolol tartrate (LOPRESSOR) 50 MG tablet Take 1.5 tablets (75 mg total) by mouth 2 (two) times daily. 270 tablet 3   No current facility-administered medications for this visit.    Allergies:   Sulfamethoxazole-trimethoprim, Allopurinol, and Atorvastatin   Social History:  The patient  reports that he quit smoking about 45 years ago. His smoking use included cigarettes. He has never  used smokeless tobacco. He reports that he does not drink alcohol and does not use drugs.   Family History:  The patient's family history includes CAD in his father; Colon cancer in his sister; Stroke in his father.   ROS:  Please see the history of present illness.   Otherwise, review of systems is positive for none.   All other systems are reviewed and negative.   PHYSICAL EXAM: VS:  BP 124/72   Pulse 86   Ht '5\' 10"'$  (1.778 m)   Wt 250 lb 6.4 oz (113.6 kg)   SpO2 95%   BMI 35.93 kg/m  , BMI Body mass index is 35.93 kg/m. GEN: Well nourished, well developed, in no acute distress  HEENT: normal  Neck: no JVD, carotid bruits, or masses Cardiac: RRR; no murmurs, rubs, or gallops,no edema  Respiratory:  clear to auscultation bilaterally, normal work of breathing GI: soft, nontender, nondistended, + BS MS: no deformity or atrophy  Skin: warm and dry, device site well healed Neuro:  Strength and sensation are intact Psych: euthymic mood, full affect  EKG:  EKG is ordered today. Personal review of the ekg ordered shows AV paced  Personal review of the device interrogation today. Results in [place]: 11/05/2021: BUN 13; Creatinine, Ser 0.84; Magnesium 1.9; Potassium 4.3; Sodium 135    Lipid Panel  No results found for: "CHOL", "TRIG", "HDL", "CHOLHDL", "VLDL", "LDLCALC", "LDLDIRECT"   Wt Readings from Last 3 Encounters:  12/28/21 250 lb 6.4 oz (113.6 kg)  12/21/21 247 lb (112 kg)  11/05/21 246 lb (111.6 kg)      Other studies Reviewed: Additional studies/ records that were reviewed today include: TTE 11/25/17  Review of the above records today demonstrates:  - Left ventricle: The cavity size was normal. There was moderate   concentric hypertrophy. Systolic function was normal. The   estimated ejection fraction was in the range of 55% to 65%. Wall   motion was normal; there were no regional wall motion   abnormalities. Features are consistent with a  pseudonormal left   ventricular filling pattern, with concomitant abnormal relaxation   and increased filling pressure (grade 2 diastolic dysfunction).   Doppler parameters are consistent with high ventricular filling   pressure. - Aortic valve: Trileaflet; severely thickened, moderately   calcified leaflets. Valve mobility was severely restricted. There   was moderate to severe stenosis. There was mild regurgitation.   Peak velocity (S): 403 cm/s. Mean velocity (S): 284 cm/s. Mean   gradient (S): 37 mm Hg. Peak gradient (S): 65 mm Hg. VTI ratio of   LVOT to aortic valve: 0.32. Valve area (VTI): 1.12 cm^2. Valve   area (  Vmax): 1.15 cm^2. Valve area (Vmean): 1.08 cm^2. 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 - Mitral valve: Moderately to severely calcified, moderately   thickened annulus. The findings are consistent with mild   stenosis. There was mild regurgitation. Valve area by continuity   equation (using LVOT flow): 2.03 cm^2. - Left atrium: The atrium was severely dilated. - Pulmonary arteries: PA peak pressure: 40 mm Hg (S).   ASSESSMENT AND PLAN:  1.  Complete heart block: Status post Medtronic dual-chamber pacemaker.  Device function appropriately.  No changes at this time.  2.  Paroxysmal atrial fibrillation: Currently on Xarelto 20 mg daily, dofetilide 500 mcg twice daily.  High risk medication monitoring for dofetilide.  CHA2DS2-VASc of 3.  He remains in sinus rhythm.  Recent labs show stable electrolytes.  Continue with current management.  3.  Moderate aortic stenosis: Followed by primary cardiology  4.  Hyperlipidemia: Continue Crestor per primary care.  5.  Secondary hypercoagulable state: Currently on Xarelto for atrial fibrillation as above  Current medicines are reviewed at length with the patient today.   The patient does not have concerns regarding his medicines.  The following changes were made today: None  Labs/ tests ordered today  include:  Orders Placed This Encounter  Procedures   EKG 12-Lead     Disposition:   FU with Bobette Leyh 6 months  Signed, Ilai Hiller Frederick Leeds, MD  12/28/2021 11:20 AM     [place] [address] [phone] (office) [phone] (fax)

## 2022-01-01 ENCOUNTER — Telehealth: Payer: Self-pay

## 2022-01-01 NOTE — Telephone Encounter (Signed)
-----   Message from Will Meredith Leeds, MD sent at 12/16/2021  8:28 AM EDT ----- Abnormal device interrogation reviewed.  Lead parameters and battery status stable.  NSVT noted. Increase metoprolol to 75 mg BID.

## 2022-01-01 NOTE — Telephone Encounter (Signed)
Pt seen in [place] clinic and medication changed.

## 2022-01-04 ENCOUNTER — Other Ambulatory Visit (HOSPITAL_COMMUNITY): Payer: Self-pay | Admitting: Nurse Practitioner

## 2022-01-06 ENCOUNTER — Other Ambulatory Visit: Payer: Self-pay | Admitting: Cardiology

## 2022-01-06 NOTE — Telephone Encounter (Signed)
Prescription refill request for Xarelto received.  Indication:Afib Last office visit:8/23 Weight:113.6 kg Age:81 Scr:0.7 CrCl:135.24 ml/min  Prescription refilled

## 2022-01-07 NOTE — Progress Notes (Signed)
Remote pacemaker transmission.   

## 2022-03-08 ENCOUNTER — Ambulatory Visit (INDEPENDENT_AMBULATORY_CARE_PROVIDER_SITE_OTHER): Payer: Medicare Other

## 2022-03-08 DIAGNOSIS — I442 Atrioventricular block, complete: Secondary | ICD-10-CM

## 2022-03-09 LAB — CUP PACEART REMOTE DEVICE CHECK
Battery Impedance: 2074 Ohm
Battery Remaining Longevity: 30 mo
Battery Voltage: 2.75 V
Brady Statistic AP VP Percent: 53 %
Brady Statistic AP VS Percent: 0 %
Brady Statistic AS VP Percent: 47 %
Brady Statistic AS VS Percent: 0 %
Date Time Interrogation Session: 20231016090027
Implantable Lead Implant Date: 20131113
Implantable Lead Implant Date: 20131113
Implantable Lead Location: 753859
Implantable Lead Location: 753860
Implantable Lead Model: 5076
Implantable Lead Model: 5092
Implantable Pulse Generator Implant Date: 20131113
Lead Channel Impedance Value: 366 Ohm
Lead Channel Impedance Value: 655 Ohm
Lead Channel Pacing Threshold Amplitude: 0.5 V
Lead Channel Pacing Threshold Amplitude: 0.625 V
Lead Channel Pacing Threshold Pulse Width: 0.4 ms
Lead Channel Pacing Threshold Pulse Width: 0.4 ms
Lead Channel Setting Pacing Amplitude: 2 V
Lead Channel Setting Pacing Amplitude: 2.5 V
Lead Channel Setting Pacing Pulse Width: 0.4 ms
Lead Channel Setting Sensing Sensitivity: 4 mV

## 2022-03-11 ENCOUNTER — Other Ambulatory Visit: Payer: Self-pay | Admitting: Cardiology

## 2022-04-01 NOTE — Progress Notes (Signed)
Remote pacemaker transmission.   

## 2022-04-12 DIAGNOSIS — M5030 Other cervical disc degeneration, unspecified cervical region: Secondary | ICD-10-CM | POA: Insufficient documentation

## 2022-04-12 DIAGNOSIS — M4302 Spondylolysis, cervical region: Secondary | ICD-10-CM | POA: Insufficient documentation

## 2022-05-27 ENCOUNTER — Telehealth: Payer: Self-pay | Admitting: Cardiology

## 2022-05-27 MED ORDER — DOFETILIDE 500 MCG PO CAPS: 500.0000 ug | ORAL_CAPSULE | Freq: Two times a day (BID) | ORAL | 7 refills | Status: DC

## 2022-05-27 NOTE — Telephone Encounter (Signed)
Pt's medication was sent to pt's pharmacy as requested. Confirmation received.  °

## 2022-05-27 NOTE — Telephone Encounter (Signed)
*  STAT* If patient is at the pharmacy, call can be transferred to refill team.   1. Which medications need to be refilled? (please list name of each medication and dose if known)  dofetilide (TIKOSYN) 500 MCG capsule  2. Which pharmacy/location (including street and city if local pharmacy) is medication to be sent to? [facility] [phone] - [place], [address]    3. Do they need a 30 day or 90 day supply?  90 day supply  Patient's wife states the patient has 1 tablet remaining.

## 2022-06-07 ENCOUNTER — Ambulatory Visit (INDEPENDENT_AMBULATORY_CARE_PROVIDER_SITE_OTHER): Payer: Medicare Other

## 2022-06-07 DIAGNOSIS — I442 Atrioventricular block, complete: Secondary | ICD-10-CM | POA: Diagnosis not present

## 2022-06-09 LAB — CUP PACEART REMOTE DEVICE CHECK
Battery Impedance: 2168 Ohm
Battery Remaining Longevity: 30 mo
Battery Voltage: 2.74 V
Brady Statistic AP VP Percent: 53 %
Brady Statistic AP VS Percent: 0 %
Brady Statistic AS VP Percent: 47 %
Brady Statistic AS VS Percent: 0 %
Date Time Interrogation Session: 20240115090141
Implantable Lead Connection Status: 753985
Implantable Lead Connection Status: 753985
Implantable Lead Implant Date: 20131113
Implantable Lead Implant Date: 20131113
Implantable Lead Location: 753859
Implantable Lead Location: 753860
Implantable Lead Model: 5076
Implantable Lead Model: 5092
Implantable Pulse Generator Implant Date: 20131113
Lead Channel Impedance Value: 389 Ohm
Lead Channel Impedance Value: 689 Ohm
Lead Channel Pacing Threshold Amplitude: 0.5 V
Lead Channel Pacing Threshold Amplitude: 0.75 V
Lead Channel Pacing Threshold Pulse Width: 0.4 ms
Lead Channel Pacing Threshold Pulse Width: 0.4 ms
Lead Channel Setting Pacing Amplitude: 2 V
Lead Channel Setting Pacing Amplitude: 2.5 V
Lead Channel Setting Pacing Pulse Width: 0.4 ms
Lead Channel Setting Sensing Sensitivity: 4 mV
Zone Setting Status: 755011
Zone Setting Status: 755011

## 2022-06-22 ENCOUNTER — Ambulatory Visit: Payer: Medicare Other | Attending: Cardiology | Admitting: Cardiology

## 2022-06-22 ENCOUNTER — Encounter: Payer: Self-pay | Admitting: Cardiology

## 2022-06-22 VITALS — BP 114/72 | HR 92 | Ht 69.0 in | Wt 245.4 lb

## 2022-06-22 DIAGNOSIS — I442 Atrioventricular block, complete: Secondary | ICD-10-CM | POA: Insufficient documentation

## 2022-06-22 DIAGNOSIS — Z950 Presence of cardiac pacemaker: Secondary | ICD-10-CM | POA: Insufficient documentation

## 2022-06-22 DIAGNOSIS — I10 Essential (primary) hypertension: Secondary | ICD-10-CM | POA: Insufficient documentation

## 2022-06-22 DIAGNOSIS — I2510 Atherosclerotic heart disease of native coronary artery without angina pectoris: Secondary | ICD-10-CM | POA: Diagnosis not present

## 2022-06-22 DIAGNOSIS — Z952 Presence of prosthetic heart valve: Secondary | ICD-10-CM | POA: Insufficient documentation

## 2022-06-22 DIAGNOSIS — R0609 Other forms of dyspnea: Secondary | ICD-10-CM | POA: Diagnosis present

## 2022-06-22 MED ORDER — AMLODIPINE BESYLATE 2.5 MG PO TABS: 2.5000 mg | ORAL_TABLET | Freq: Every day | ORAL | 3 refills | Status: DC

## 2022-06-22 NOTE — Patient Instructions (Signed)
Medication Instructions:   DECREASE: Amlodipine to 2.'5mg'$  daily- you may cut your current dose in 1/2 and your next refill will reflect your new dose.    Lab Work: None Ordered If you have labs (blood work) drawn today and your tests are completely normal, you will receive your results only by: [place] (if you have MyChart) OR A paper copy in the mail If you have any lab test that is abnormal or we need to change your treatment, we will call you to review the results.   Testing/Procedures: Your physician has requested that you have an echocardiogram. Echocardiography is a painless test that uses sound waves to create images of your heart. It provides your doctor with information about the size and shape of your heart and how well your heart's chambers and valves are working. This procedure takes approximately one hour. There are no restrictions for this procedure. Please do NOT wear cologne, perfume, aftershave, or lotions (deodorant is allowed). Please arrive 15 minutes prior to your appointment time.    Follow-Up: At [facility], you and your health needs are our priority.  As part of our continuing mission to provide you with exceptional heart care, we have created designated Provider Care Teams.  These Care Teams include your primary Cardiologist (physician) and Advanced Practice Providers (APPs -  Physician Assistants and Nurse Practitioners) who all work together to provide you with the care you need, when you need it.  We recommend signing up for the patient portal called "MyChart".  Sign up information is provided on this After Visit Summary.  MyChart is used to connect with patients for Virtual Visits (Telemedicine).  Patients are able to view lab/test results, encounter notes, upcoming appointments, etc.  Non-urgent messages can be sent to your provider as well.   To learn more about what you can do with MyChart, go to [email or web address].    Your next appointment:    6 month(s)  The format for your next appointment:   In Person  Provider:   [name], MD    Other Instructions NA

## 2022-06-22 NOTE — Addendum Note (Signed)
Addended by: Jacobo Forest D on: 06/22/2022 11:54 AM   Modules accepted: Orders

## 2022-06-22 NOTE — Progress Notes (Signed)
Cardiology Office Note:    Date:  06/22/2022   ID:  Frederick Vasquez, DOB Jan 04, 1941, MRN [id number]  PCP:  Algis Greenhouse, MD  Cardiologist:  [name], MD    Referring MD: Algis Greenhouse, MD   Chief Complaint  Patient presents with   Follow-up  Doing well  History of Present Illness:    Frederick Vasquez is a 82 y.o. 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 He is in my office today for follow-up.  Overall doing very well.  In the middle foot yesterday he was raking leaves outside.  He describes however some episode of dizziness.  When he walks he will feel unsteady and limit dizzy worse when he gets up quickly.  He never passed out he never fell down because of that.  Denies have any chest pain tightness squeezing pressure burning chest no palpitations  Past Medical History:  Diagnosis Date   Acute prostatitis 06/06/2020   Formatting of this note might be different from the original. 06/06/2020   Aortic stenosis 05/15/2017   Severe by echo from October 2019  Formatting of this note might be different from the original. Formatting of this note might be different from the original. 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 Chest CT 01/18/2019 2019: USN exposure 2019: CXR: IMPRESSION: 1. 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 2. No active infiltrate or effusion. 2019: PULM eval   Atrial fibrillation ([place])    Xarelto   Balanitis 04/10/2020   Formatting of this note might be different from  the original. 04/10/2020   Chronic anticoagulation 06/14/2017   CHADS VASC=4   Chronic diastolic CHF (congestive heart failure) ([place]) 05/15/2017   Contact with and (suspected) exposure to asbestos 11/17/2017   Formatting of this note might be different from the original. 1961-64, [place] room with pipe insulation contact and movement 2018: pleural plaque on CT   Diarrhea of presumed infectious origin 09/25/2019   Formatting of this note might be different from the original. 5427   Diastolic heart failure ([place])    Dyslipidemia 11/19/2014   Dysrhythmia    A- Fib   Elevated troponin 05/15/2017   Encounter for administration of vaccine 11/19/2014   Essential hypertension 05/15/2017   Exacerbation of chronic bronchiolitis 02/28/2020   Formatting of this note might be different from the original. 02/28/2020   Gastrointestinal hemorrhage 09/25/2019   Formatting of this note might be different from the original. 2021   Hemoptysis 05/15/2017   Formatting of this note might be different from the original. 2019   History of aortic valvular stenosis 02/26/2016   Last Assessment & Plan:  Formatting of this note might be different from the original. Moderate.   History of gout 08/27/2015   Hyperlipidemia    Hypertension    Hypothyroidism 09/13/2019   Formatting of this note might be different from the original. 2021: TSH 41   Impaired glucose tolerance    Lung nodule 01/25/2019   Formatting of this note is different from the original. Chest CT 01/18/2019 notes a  stable 4 mm right middle lobe lung nodule- recheck chest CT August 2021   Lupus [facility])    Normal coronary arteries 06/14/2017   2014 cath in [facility]   Pacemaker    Paroxysmal atrial fibrillation ([place]) 11/19/2014   Xarelto Formatting of this note might be different from the original. Managed CARDS  Formatting of this note might be different from the original. 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 05/2020: ORTHO, myelogram, surg rec   Suspected COVID-19 virus infection 09/25/2019   Formatting of this note might be different from the original. 2021   Thrombocytopenia ([place]) 05/15/2017   Type 2 diabetes mellitus without complication, without long-term current use of insulin ([place]) 08/27/2015   Formatting of this note might be different from the original. 2019: 99/5.3 2020: 125/5.7 2021: 126/6.6    Past Surgical History:  Procedure Laterality Date   CARDIAC CATHETERIZATION  2014   non-obs dz, done at [place] N/A 09/02/2017   Procedure: CARDIOVERSION;  Surgeon: Lelon Perla, MD;  Location: [place];  Service: Cardiovascular;  Laterality: N/A;   CARDIOVERSION N/A 10/29/2021   Procedure: CARDIOVERSION;  Surgeon: [name], MD;  Location: [place];  Service: Cardiovascular;  Laterality: N/A;   COLONOSCOPY     EYE SURGERY     bilateral cataracts   PACEMAKER IMPLANT  03/2012   REPLACEMENT TOTAL KNEE Left    RIGHT/LEFT HEART CATH AND CORONARY ANGIOGRAPHY N/A 03/28/2018   Procedure: RIGHT/LEFT HEART CATH AND CORONARY ANGIOGRAPHY;  Surgeon: Belva Crome, MD;  Location: [place] CV LAB;  Service: Cardiovascular;  Laterality: N/A;   TEE WITHOUT CARDIOVERSION N/A 05/12/2018   Procedure: TRANSESOPHAGEAL ECHOCARDIOGRAM (TEE);   Surgeon: Sherren Mocha, MD;  Location: [place];  Service: Open Heart Surgery;  Laterality: N/A;   TRANSCATHETER AORTIC VALVE REPLACEMENT, TRANSFEMORAL N/A 05/12/2018   Procedure: TRANSCATHETER AORTIC VALVE REPLACEMENT, TRANSFEMORAL;  Surgeon: Sherren Mocha, MD;  Location: [place];  Service: Open Heart Surgery;  Laterality: N/A;   VASECTOMY      Current Medications: Current Meds  Medication Sig   acetaminophen (TYLENOL) 500 MG tablet Take 1,000 mg by mouth every 6 (six) hours as needed for moderate pain or headache.   amLODipine (NORVASC) 5 MG tablet Take 1 tablet (5 mg total) by mouth daily.   dofetilide (TIKOSYN) 500 MCG capsule Take 1 capsule (500 mcg total) by mouth 2 (two) times daily.   levothyroxine (SYNTHROID) 88 MCG tablet Take 88 mcg by mouth daily before breakfast.   metoprolol tartrate (LOPRESSOR) 50 MG tablet Take 1.5 tablets (75 mg total) by mouth 2 (two) times daily.   Multiple Vitamins-Minerals (PRESERVISION AREDS 2+MULTI VIT) CAPS Take 2 capsules by mouth every morning.   nitroGLYCERIN (NITROSTAT) 0.4 MG SL tablet Place 0.4 mg under the tongue every 5 (five) minutes as needed  for chest pain.   pantoprazole (PROTONIX) 40 MG tablet Take 1 tablet by mouth daily.   potassium chloride SA (KLOR-CON M) 20 MEQ tablet Take 1 tablet (20 mEq total) by mouth once for 1 dose.   rosuvastatin (CRESTOR) 40 MG tablet Take 40 mg by mouth daily.   XARELTO 20 MG TABS tablet TAKE 1 TABLET(20 MG) BY MOUTH DAILY WITH SUPPER (Patient taking differently: Take 20 mg by mouth daily with supper.)     Allergies:   Sulfamethoxazole-trimethoprim, Allopurinol, and Atorvastatin   Social History   Socioeconomic History   Marital status: Married    Spouse name: Not on file   Number of children: Not on file   Years of education: Not on file   Highest education level: Not on file  Occupational History   Occupation: retired  Tobacco Use   Smoking status: Former    Types: Cigarettes    Quit date: 1978     Years since quitting: 46.1   Smokeless tobacco: Never   Tobacco comments:    Former smoker 10/27/21  Vaping Use   Vaping Use: Never used  Substance and Sexual Activity   Alcohol use: No   Drug use: No   Sexual activity: Not on file  Other Topics Concern   Not on file  Social History Narrative   Pt lives in [place] Determinants of Health   Financial Resource Strain: Not on file  Food Insecurity: Not on file  Transportation Needs: Not on file  Physical Activity: Not on file  Stress: Not on file  Social Connections: Not on file     Family History: The patient's family history includes CAD in his father; Colon cancer in his sister; Stroke in his father. ROS:   Please see the history of present illness.    All 14 point review of systems negative except as described per history of present illness  EKGs/Labs/Other Studies Reviewed:      Recent Labs: 11/05/2021: BUN 13; Creatinine, Ser 0.84; Magnesium 1.9; Potassium 4.3; Sodium 135  Recent Lipid Panel No results found for: "CHOL", "TRIG", "HDL", "CHOLHDL", "VLDL", "LDLCALC", "LDLDIRECT"  Physical Exam:    VS:  BP 114/72 (BP Location: Left Arm, Patient Position: Sitting)   Pulse 92   Ht '5\' 9"'$  (1.753 m)   Wt 245 lb 6.4 oz (111.3 kg)   SpO2 91%   BMI 36.24 kg/m     Wt Readings from Last 3 Encounters:  06/22/22 245 lb 6.4 oz (111.3 kg)  12/28/21 250 lb 6.4 oz (113.6 kg)  12/21/21 247 lb (112 kg)     GEN:  Well nourished, well developed in no acute distress HEENT: Normal NECK: No JVD; No carotid bruits LYMPHATICS: No lymphadenopathy CARDIAC: RRR, systolic ejection murmur 7-3/2 best heard right upper portion of the sternum, no rubs, no gallops RESPIRATORY:  Clear to auscultation without rales, wheezing or rhonchi  ABDOMEN: Soft, non-tender, non-distended MUSCULOSKELETAL:  No edema; No deformity  SKIN: Warm and dry LOWER EXTREMITIES: no swelling NEUROLOGIC:  Alert and oriented x 3 PSYCHIATRIC:  Normal  affect   ASSESSMENT:    1. S/P TAVR (transcatheter aortic valve replacement)   2. Coronary artery disease involving native coronary artery of native heart without angina pectoris   3. AV block, complete (HCC)   4. Essential hypertension   5. Pacemaker    PLAN:    In order of problems listed above:  Status post TAVI.  Doing well from that point review we will  make arrangements for echocardiogram to check the valve.  I am doing this especially since he complained of having some dizziness. Coronary artery disease not critical.  On appropriate medications which I will continue. Paroxysmal atrial fibrillation, I did review interrogation of his device which did not show evidence of atrial fibrillation, he is anticoagulated with Xarelto which I will continue.  He is also on Tikosyn, Essential hypertension I am worried that his blood pressure is too low, will cut down his amlodipine to only 2.5 daily. Pacemaker present 30 months left in the device I did review interrogation normal parameters, no evidence of atrial fibrillation   Medication Adjustments/Labs and Tests Ordered: Current medicines are reviewed at length with the patient today.  Concerns regarding medicines are outlined above.  No orders of the defined types were placed in this encounter.  Medication changes: No orders of the defined types were placed in this encounter.   Signed, Park Liter, MD, [facility] 06/22/2022 11:47 AM    [place]

## 2022-06-29 ENCOUNTER — Ambulatory Visit: Payer: Medicare Other | Attending: Cardiology

## 2022-06-29 DIAGNOSIS — R0609 Other forms of dyspnea: Secondary | ICD-10-CM | POA: Diagnosis present

## 2022-06-29 LAB — ECHOCARDIOGRAM COMPLETE
AV Mean grad: 13 mmHg
AV Peak grad: 21.1 mmHg
Ao pk vel: 2.3 m/s
Area-P 1/2: 1.51 cm2
S' Lateral: 3.9 cm

## 2022-07-01 ENCOUNTER — Encounter (HOSPITAL_COMMUNITY): Payer: Self-pay | Admitting: *Deleted

## 2022-07-02 ENCOUNTER — Ambulatory Visit: Payer: Medicare Other | Attending: Cardiology | Admitting: Cardiology

## 2022-07-02 ENCOUNTER — Encounter: Payer: Self-pay | Admitting: Cardiology

## 2022-07-02 VITALS — BP 132/58 | HR 81 | Ht 69.0 in | Wt 241.0 lb

## 2022-07-02 DIAGNOSIS — Z79899 Other long term (current) drug therapy: Secondary | ICD-10-CM | POA: Insufficient documentation

## 2022-07-02 DIAGNOSIS — I442 Atrioventricular block, complete: Secondary | ICD-10-CM | POA: Insufficient documentation

## 2022-07-02 DIAGNOSIS — I480 Paroxysmal atrial fibrillation: Secondary | ICD-10-CM | POA: Diagnosis present

## 2022-07-02 DIAGNOSIS — D6869 Other thrombophilia: Secondary | ICD-10-CM | POA: Insufficient documentation

## 2022-07-02 DIAGNOSIS — I2510 Atherosclerotic heart disease of native coronary artery without angina pectoris: Secondary | ICD-10-CM | POA: Diagnosis not present

## 2022-07-02 LAB — CUP PACEART INCLINIC DEVICE CHECK
Battery Impedance: 2165 Ohm
Battery Remaining Longevity: 30 mo
Battery Voltage: 2.74 V
Brady Statistic AP VP Percent: 54 %
Brady Statistic AP VS Percent: 0 %
Brady Statistic AS VP Percent: 45 %
Brady Statistic AS VS Percent: 0 %
Date Time Interrogation Session: 20240209160916
Implantable Lead Connection Status: 753985
Implantable Lead Connection Status: 753985
Implantable Lead Implant Date: 20131113
Implantable Lead Implant Date: 20131113
Implantable Lead Location: 753859
Implantable Lead Location: 753860
Implantable Lead Model: 5076
Implantable Lead Model: 5092
Implantable Pulse Generator Implant Date: 20131113
Lead Channel Impedance Value: 395 Ohm
Lead Channel Impedance Value: 684 Ohm
Lead Channel Pacing Threshold Amplitude: 0.5 V
Lead Channel Pacing Threshold Amplitude: 0.625 V
Lead Channel Pacing Threshold Pulse Width: 0.4 ms
Lead Channel Pacing Threshold Pulse Width: 0.4 ms
Lead Channel Sensing Intrinsic Amplitude: 4 mV
Lead Channel Setting Pacing Amplitude: 2 V
Lead Channel Setting Pacing Amplitude: 2.5 V
Lead Channel Setting Pacing Pulse Width: 0.4 ms
Lead Channel Setting Sensing Sensitivity: 4 mV
Zone Setting Status: 755011
Zone Setting Status: 755011

## 2022-07-02 NOTE — Patient Instructions (Signed)
Medication Instructions:  Your physician recommends that you continue on your current medications as directed. Please refer to the Current Medication list given to you today.  *If you need a refill on your cardiac medications before your next appointment, please call your pharmacy*   Lab Work: Tikosyn surveillance lab work today: BMET & Magnesium  If you have labs (blood work) drawn today and your tests are completely normal, you will receive your results only by: [employer] (if you have MyChart) OR A paper copy in the mail If you have any lab test that is abnormal or we need to change your treatment, we will call you to review the results.   Testing/Procedures: None ordered   Follow-Up: At [facility], you and your health needs are our priority.  As part of our continuing mission to provide you with exceptional heart care, we have created designated Provider Care Teams.  These Care Teams include your primary Cardiologist (physician) and Advanced Practice Providers (APPs -  Physician Assistants and Nurse Practitioners) who all work together to provide you with the care you need, when you need it.  Remote monitoring is used to monitor your Pacemaker or ICD from home. This monitoring reduces the number of office visits required to check your device to one time per year. It allows [place] to keep an eye on the functioning of your device to ensure it is working properly. You are scheduled for a device check from home on 09/06/22. You may send your transmission at any time that day. If you have a wireless device, the transmission will be sent automatically. After your physician reviews your transmission, you will receive a postcard with your next transmission date.  Your next appointment:   6 month(s)  The format for your next appointment:   In Person  Provider:   Allegra Lai, MD    Thank you for choosing [facility]!!   Trinidad Curet, RN [phone]

## 2022-07-02 NOTE — Progress Notes (Signed)
Electrophysiology Office Note   Date:  07/02/2022   ID:  Frederick Vasquez, Frederick Vasquez 30-May-1940, MRN [id number]  PCP:  Algis Greenhouse, MD  Cardiologist:  Agustin Cree Primary Electrophysiologist:  Linde Wilensky Meredith Leeds, MD    No chief complaint on file.    History of Present Illness: Frederick Vasquez is a 82 y.o. male who is being seen today for the evaluation of atrial fibrillation, pacemaker at the request of [name]. Presenting today for electrophysiology evaluation.    History significant for aortic stenosis post TAVR in 2019, atrial fibrillation, diastolic heart failure, hypertension, hyperlipidemia.  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  He is on dofetilide for atrial fibrillation.  Today, denies symptoms of palpitations, chest pain, shortness of breath, orthopnea, PND, lower extremity edema, claudication, dizziness, presyncope, syncope, bleeding, or neurologic sequela. The patient is tolerating medications without difficulties.     Past Medical History:  Diagnosis Date   Acute prostatitis 06/06/2020   Formatting of this note might be different from the original. 06/06/2020   Aortic stenosis 05/15/2017   Severe by echo from October 2019  Formatting of this note might be different from the original. Formatting of this note might be different from the original. 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 Chest CT 01/18/2019 2019: USN exposure 2019: CXR: IMPRESSION: 1. 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 2. No active infiltrate or effusion. 2019: PULM eval   Atrial fibrillation ([place])    Xarelto   Balanitis 04/10/2020   Formatting of this note might be different from the original. 04/10/2020   Chronic anticoagulation 06/14/2017   CHADS VASC=4   Chronic  diastolic CHF (congestive heart failure) ([place]) 05/15/2017   Contact with and (suspected) exposure to asbestos 11/17/2017   Formatting of this note might be different from the original. 1961-64, [place] room with pipe insulation contact and movement 2018: pleural plaque on CT   Diarrhea of presumed infectious origin 09/25/2019   Formatting of this note might be different from the original. [id number]   Diastolic heart failure ([place])    Dyslipidemia 11/19/2014   Dysrhythmia    A- Fib   Elevated troponin 05/15/2017   Encounter for administration of vaccine 11/19/2014   Essential hypertension 05/15/2017   Exacerbation of chronic bronchiolitis 02/28/2020   Formatting of this note might be different from the original. 02/28/2020   Gastrointestinal hemorrhage 09/25/2019   Formatting of this note might be different from the original. 2021   Hemoptysis 05/15/2017   Formatting of this note might be different from the original. 2019   History of aortic valvular stenosis 02/26/2016   Last Assessment & Plan:  Formatting of this note might be different from the original. Moderate.   History of gout 08/27/2015   Hyperlipidemia    Hypertension    Hypothyroidism 09/13/2019   Formatting of this note might be different from the original. 2021: TSH 41   Impaired glucose tolerance    Lung nodule 01/25/2019   Formatting of this note is different from the original. Chest CT 01/18/2019 notes a stable 4 mm right middle lobe lung nodule- recheck chest CT August 2021   Lupus [facility])    Normal coronary arteries 06/14/2017   2014 cath in [facility]   Pacemaker    Paroxysmal  atrial fibrillation ([place]) 11/19/2014   Xarelto Formatting of this note might be different from the original. Managed CARDS  Formatting of this note might be different from the original. 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 05/2020: ORTHO, myelogram, surg rec   Suspected COVID-19 virus infection 09/25/2019   Formatting of this note might be different from the original. 2021   Thrombocytopenia ([place]) 05/15/2017   Type 2 diabetes mellitus without complication, without long-term current use of insulin ([place]) 08/27/2015   Formatting of this note might be different from the original. 2019: 99/5.3 2020: 125/5.7 2021: 126/6.6   Past Surgical History:  Procedure Laterality Date   CARDIAC CATHETERIZATION  2014   non-obs dz, done at [place] N/A 09/02/2017   Procedure: CARDIOVERSION;  Surgeon: Lelon Perla, MD;  Location: [place];  Service: Cardiovascular;  Laterality: N/A;   CARDIOVERSION N/A 10/29/2021   Procedure: CARDIOVERSION;  Surgeon: [name], MD;  Location: [place];  Service: Cardiovascular;  Laterality: N/A;   COLONOSCOPY     EYE SURGERY     bilateral cataracts   PACEMAKER IMPLANT  03/2012   REPLACEMENT TOTAL KNEE Left    RIGHT/LEFT HEART CATH AND CORONARY ANGIOGRAPHY N/A 03/28/2018   Procedure: RIGHT/LEFT HEART CATH AND CORONARY ANGIOGRAPHY;  Surgeon: Belva Crome, MD;  Location: [place] CV LAB;  Service: Cardiovascular;  Laterality: N/A;   TEE WITHOUT CARDIOVERSION N/A 05/12/2018   Procedure: TRANSESOPHAGEAL ECHOCARDIOGRAM (TEE);  Surgeon: Sherren Mocha, MD;  Location: [place];  Service: Open Heart Surgery;  Laterality:  N/A;   TRANSCATHETER AORTIC VALVE REPLACEMENT, TRANSFEMORAL N/A 05/12/2018   Procedure: TRANSCATHETER AORTIC VALVE REPLACEMENT, TRANSFEMORAL;  Surgeon: Sherren Mocha, MD;  Location: [place];  Service: Open Heart Surgery;  Laterality: N/A;   VASECTOMY       Current Outpatient Medications  Medication Sig Dispense Refill   acetaminophen (TYLENOL) 500 MG tablet Take 1,000 mg by mouth every 6 (six) hours as needed for moderate pain or headache.     amLODipine (NORVASC) 2.5 MG tablet Take 1 tablet (2.5 mg total) by mouth daily. 90 tablet 3   dofetilide (TIKOSYN) 500 MCG capsule Take 1 capsule (500 mcg total) by mouth 2 (two) times daily. 60 capsule 7   levothyroxine (SYNTHROID) 88 MCG tablet Take 88 mcg by mouth daily before breakfast.     metoprolol tartrate (LOPRESSOR) 50 MG tablet Take 1.5 tablets (75 mg total) by mouth 2 (two) times daily. 270 tablet 3   Multiple Vitamins-Minerals (PRESERVISION AREDS 2+MULTI VIT) CAPS Take 2 capsules by mouth every morning.     nitroGLYCERIN (NITROSTAT) 0.4 MG SL tablet Place 0.4 mg under the tongue every 5 (five) minutes as needed for chest pain.     pantoprazole (PROTONIX) 40 MG tablet Take 1 tablet by mouth daily.  rosuvastatin (CRESTOR) 40 MG tablet Take 40 mg by mouth daily.     XARELTO 20 MG TABS tablet TAKE 1 TABLET(20 MG) BY MOUTH DAILY WITH SUPPER (Patient taking differently: Take 20 mg by mouth daily with supper.) 90 tablet 1   potassium chloride SA (KLOR-CON M) 20 MEQ tablet Take 1 tablet (20 mEq total) by mouth once for 1 dose. 90 tablet 1   No current facility-administered medications for this visit.    Allergies:   Sulfamethoxazole-trimethoprim, Allopurinol, and Atorvastatin   Social History:  The patient  reports that he quit smoking about 46 years ago. His smoking use included cigarettes. He has never used smokeless tobacco. He reports that he does not drink alcohol and does not use drugs.   Family History:  The patient's family history  includes CAD in his father; Colon cancer in his sister; Stroke in his father.   ROS:  Please see the history of present illness.   Otherwise, review of systems is positive for none.   All other systems are reviewed and negative.   PHYSICAL EXAM: VS:  BP (!) 132/58   Pulse 81   Ht 5' 9"$  (1.753 m)   Wt 241 lb (109.3 kg)   SpO2 94%   BMI 35.59 kg/m  , BMI Body mass index is 35.59 kg/m. GEN: Well nourished, well developed, in no acute distress  HEENT: normal  Neck: no JVD, carotid bruits, or masses Cardiac: RRR; no murmurs, rubs, or gallops,no edema  Respiratory:  clear to auscultation bilaterally, normal work of breathing GI: soft, nontender, nondistended, + BS MS: no deformity or atrophy  Skin: warm and dry, device site well healed Neuro:  Strength and sensation are intact Psych: euthymic mood, full affect  EKG:  EKG is ordered today. Personal review of the ekg ordered shows AV paced  Personal review of the device interrogation today. Results in [place]: 11/05/2021: BUN 13; Creatinine, Ser 0.84; Magnesium 1.9; Potassium 4.3; Sodium 135    Lipid Panel  No results found for: "CHOL", "TRIG", "HDL", "CHOLHDL", "VLDL", "LDLCALC", "LDLDIRECT"   Wt Readings from Last 3 Encounters:  07/02/22 241 lb (109.3 kg)  06/22/22 245 lb 6.4 oz (111.3 kg)  12/28/21 250 lb 6.4 oz (113.6 kg)      Other studies Reviewed: Additional studies/ records that were reviewed today include: TTE 06/29/22 Review of the above records today demonstrates:   1. Left ventricular ejection fraction, by estimation, is 55 to 60%. The  left ventricle has normal function. The left ventricle has no regional  wall motion abnormalities. There is mild concentric left ventricular  hypertrophy. Left ventricular diastolic  parameters are indeterminate. The average left ventricular global  longitudinal strain is -16.7 %. The global longitudinal strain is  abnormal.   2. Right ventricular systolic  function is normal. The right ventricular  size is normal. There is mildly elevated pulmonary artery systolic  pressure.   3. Left atrial size was moderately dilated.   4. The mitral valve is normal in structure. No evidence of mitral valve  regurgitation. Mild mitral stenosis. Moderate mitral annular  calcification.   5. Normal LVOT peak velocity. Aortic valve regurgitation is mild. There  is a 26 mm Sapien prosthetic (TAVR) valve present in the aortic position.  Procedure Date: 05/12/2018. Echo findings are consistent with  regurgitation of the aortic prosthesis.   6. The inferior vena cava is normal in size with greater than 50%  respiratory variability, suggesting right atrial  pressure of 3 mmHg.    ASSESSMENT AND PLAN:  1.  Complete heart block: Status post Medtronic dual-chamber pacemaker functioning appropriately.  No changes.  2.  Paroxysmal atrial fibrillation: Currently on Xarelto and dofetilide, dose as above.  CHA2DS2-VASc of 3.  Remains in sinus rhythm.  No changes.  3.  Second hypercoagulable state: Currently on Xarelto for atrial fibrillation as above  4.  High risk medication monitoring: Currently on dofetilide as above.  Kevonna Nolte check BMP, Mg today.  5.  Moderate aortic stenosis: Followed by primary cardiology   Current medicines are reviewed at length with the patient today.   The patient does not have concerns regarding his medicines.  The following changes were made today: none  Labs/ tests ordered today include:  Orders Placed This Encounter  Procedures   Basic metabolic panel   CBC   EKG 12-Lead     Disposition:   FU 6 months  Signed, Saje Gallop Meredith Leeds, MD  07/02/2022 3:15 PM     [place] [id number] [phone] (office) [phone] (fax)

## 2022-07-03 LAB — BASIC METABOLIC PANEL
BUN/Creatinine Ratio: 18 (ref 10–24)
BUN: 18 mg/dL (ref 8–27)
CO2: 20 mmol/L (ref 20–29)
Calcium: 9.3 mg/dL (ref 8.6–10.2)
Chloride: 103 mmol/L (ref 96–106)
Creatinine, Ser: 1 mg/dL (ref 0.76–1.27)
Glucose: 144 mg/dL — ABNORMAL HIGH (ref 70–99)
Potassium: 4.5 mmol/L (ref 3.5–5.2)
Sodium: 140 mmol/L (ref 134–144)
eGFR: 76 mL/min/{1.73_m2} (ref >59)

## 2022-07-03 LAB — CBC
Hematocrit: 37.3 % — ABNORMAL LOW (ref 37.5–51.0)
Hemoglobin: 12.4 g/dL — ABNORMAL LOW (ref 13.0–17.7)
MCH: 30.5 pg (ref 26.6–33.0)
MCHC: 33.2 g/dL (ref 31.5–35.7)
MCV: 92 fL (ref 79–97)
Platelets: 130 10*3/uL — ABNORMAL LOW (ref 150–450)
RBC: 4.06 x10E6/uL — ABNORMAL LOW (ref 4.14–5.80)
RDW: 12.7 % (ref 11.6–15.4)
WBC: 5.2 10*3/uL (ref 3.4–10.8)

## 2022-07-21 ENCOUNTER — Telehealth: Payer: Self-pay

## 2022-07-21 NOTE — Telephone Encounter (Signed)
Results reviewed with pt as per Dr. Krasowski's note.  Pt verbalized understanding and had no additional questions. Routed to PCP  

## 2022-07-26 NOTE — Progress Notes (Signed)
Remote pacemaker transmission.   

## 2022-08-07 ENCOUNTER — Other Ambulatory Visit: Payer: Self-pay | Admitting: Cardiology

## 2022-08-09 NOTE — Telephone Encounter (Signed)
Prescription refill request for Xarelto received.  Indication: AF Last office visit: 06/22/22  Nelta Numbers MD Weight: 111.3kg Age: 82 Scr: 1.00 on 07/02/22  Epic CrCl: 92.75  Based on above findings Xarelto 20mg  daily is the appropriate dose.  Refill approved.

## 2022-08-23 ENCOUNTER — Other Ambulatory Visit: Payer: Self-pay

## 2022-08-23 MED ORDER — POTASSIUM CHLORIDE CRYS ER 20 MEQ PO TBCR: 20.0000 meq | EXTENDED_RELEASE_TABLET | Freq: Every day | ORAL | 3 refills | Status: DC

## 2022-09-06 ENCOUNTER — Ambulatory Visit (INDEPENDENT_AMBULATORY_CARE_PROVIDER_SITE_OTHER): Payer: Medicare Other

## 2022-09-06 DIAGNOSIS — I442 Atrioventricular block, complete: Secondary | ICD-10-CM

## 2022-09-07 LAB — CUP PACEART REMOTE DEVICE CHECK
Battery Impedance: 2273 Ohm
Battery Remaining Longevity: 28 mo
Battery Voltage: 2.74 V
Brady Statistic AP VP Percent: 60 %
Brady Statistic AP VS Percent: 0 %
Brady Statistic AS VP Percent: 40 %
Brady Statistic AS VS Percent: 0 %
Date Time Interrogation Session: 20240415090200
Implantable Lead Connection Status: 753985
Implantable Lead Connection Status: 753985
Implantable Lead Implant Date: 20131113
Implantable Lead Implant Date: 20131113
Implantable Lead Location: 753859
Implantable Lead Location: 753860
Implantable Lead Model: 5076
Implantable Lead Model: 5092
Implantable Pulse Generator Implant Date: 20131113
Lead Channel Impedance Value: 364 Ohm
Lead Channel Impedance Value: 701 Ohm
Lead Channel Pacing Threshold Amplitude: 0.5 V
Lead Channel Pacing Threshold Amplitude: 0.625 V
Lead Channel Pacing Threshold Pulse Width: 0.4 ms
Lead Channel Pacing Threshold Pulse Width: 0.4 ms
Lead Channel Setting Pacing Amplitude: 2 V
Lead Channel Setting Pacing Amplitude: 2.5 V
Lead Channel Setting Pacing Pulse Width: 0.4 ms
Lead Channel Setting Sensing Sensitivity: 4 mV
Zone Setting Status: 755011
Zone Setting Status: 755011

## 2022-10-13 NOTE — Progress Notes (Signed)
Remote pacemaker transmission.   

## 2022-10-23 ENCOUNTER — Other Ambulatory Visit: Payer: Self-pay | Admitting: Cardiology

## 2022-10-31 ENCOUNTER — Other Ambulatory Visit: Payer: Self-pay | Admitting: Cardiology

## 2022-12-05 ENCOUNTER — Other Ambulatory Visit: Payer: Self-pay | Admitting: Cardiology

## 2022-12-06 ENCOUNTER — Ambulatory Visit: Payer: Medicare Other

## 2022-12-06 DIAGNOSIS — I442 Atrioventricular block, complete: Secondary | ICD-10-CM | POA: Diagnosis not present

## 2022-12-07 LAB — CUP PACEART REMOTE DEVICE CHECK
Battery Impedance: 2404 Ohm
Battery Remaining Longevity: 27 mo
Battery Voltage: 2.74 V
Brady Statistic AP VP Percent: 61 %
Brady Statistic AP VS Percent: 0 %
Brady Statistic AS VP Percent: 39 %
Brady Statistic AS VS Percent: 0 %
Date Time Interrogation Session: 20240715090001
Implantable Lead Connection Status: 753985
Implantable Lead Connection Status: 753985
Implantable Lead Implant Date: 20131113
Implantable Lead Implant Date: 20131113
Implantable Lead Location: 753859
Implantable Lead Location: 753860
Implantable Lead Model: 5076
Implantable Lead Model: 5092
Implantable Pulse Generator Implant Date: 20131113
Lead Channel Impedance Value: 380 Ohm
Lead Channel Impedance Value: 737 Ohm
Lead Channel Pacing Threshold Amplitude: 0.5 V
Lead Channel Pacing Threshold Amplitude: 0.625 V
Lead Channel Pacing Threshold Pulse Width: 0.4 ms
Lead Channel Pacing Threshold Pulse Width: 0.4 ms
Lead Channel Setting Pacing Amplitude: 2 V
Lead Channel Setting Pacing Amplitude: 2.5 V
Lead Channel Setting Pacing Pulse Width: 0.4 ms
Lead Channel Setting Sensing Sensitivity: 4 mV
Zone Setting Status: 755011
Zone Setting Status: 755011

## 2022-12-20 NOTE — Progress Notes (Signed)
Remote pacemaker transmission.   

## 2022-12-21 ENCOUNTER — Ambulatory Visit: Payer: Medicare Other | Attending: Cardiology | Admitting: Cardiology

## 2022-12-21 ENCOUNTER — Encounter: Payer: Self-pay | Admitting: Cardiology

## 2022-12-21 VITALS — BP 128/70 | HR 83 | Ht 70.0 in | Wt 237.4 lb

## 2022-12-21 DIAGNOSIS — I2510 Atherosclerotic heart disease of native coronary artery without angina pectoris: Secondary | ICD-10-CM | POA: Diagnosis not present

## 2022-12-21 DIAGNOSIS — I480 Paroxysmal atrial fibrillation: Secondary | ICD-10-CM

## 2022-12-21 DIAGNOSIS — I10 Essential (primary) hypertension: Secondary | ICD-10-CM

## 2022-12-21 DIAGNOSIS — Z950 Presence of cardiac pacemaker: Secondary | ICD-10-CM | POA: Diagnosis present

## 2022-12-21 DIAGNOSIS — Z952 Presence of prosthetic heart valve: Secondary | ICD-10-CM | POA: Diagnosis not present

## 2022-12-21 NOTE — Progress Notes (Signed)
Cardiology Office Note:    Date:  12/21/2022   ID:  Frederick Vasquez, DOB 1940/08/30, MRN [id number]  PCP:  Olive Bass, MD  Cardiologist:  Gypsy Balsam, MD    Referring MD: Olive Bass, MD   Chief Complaint  Patient presents with   Follow-up  Doing well  History of Present Illness:    Frederick Vasquez is a 82 y.o. male past medical history significant for severe aortic stenosis, status post TAVI done in 2019 with 26 mm Edwards SAPIEN 3 valve, paroxysmal atrial fibrillation suppressed with Tikosyn, essential hypertension, dyslipidemia.  Comes today to months for follow-up we will doing very well.  Denies have any chest pain tightness squeezing pressure burning chest, no palpitations dizziness swelling of lower extremities  Past Medical History:  Diagnosis Date   Acute prostatitis 06/06/2020   Formatting of this note might be different from the original. 06/06/2020   Aortic stenosis 05/15/2017   Severe by echo from October 2019  Formatting of this note might be different from the original. Formatting of this note might be different from the original. 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 Chest CT 01/18/2019 2019: [employer] exposure 2019: CXR: IMPRESSION: 1. 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 2. No active infiltrate or effusion. 2019: PULM eval   Atrial fibrillation (HCC)    Xarelto   Balanitis 04/10/2020   Formatting of this note might be different from the original. 04/10/2020   Chronic anticoagulation 06/14/2017   CHADS VASC=4   Chronic diastolic CHF (congestive heart failure) (HCC) 05/15/2017   Contact with and (suspected) exposure to asbestos 11/17/2017   Formatting of this note might be different from the original. 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 2021   Diastolic heart failure (HCC)    Dyslipidemia 11/19/2014   Dysrhythmia    A- Fib   Elevated troponin 05/15/2017   Encounter for administration of vaccine 11/19/2014   Essential hypertension 05/15/2017   Exacerbation of chronic bronchiolitis 02/28/2020   Formatting of this note might be different from the original. 02/28/2020   Gastrointestinal hemorrhage 09/25/2019   Formatting of this note might be different from the original. 2021   Hemoptysis 05/15/2017   Formatting of this note might be different from the original. 2019   History of aortic valvular stenosis 02/26/2016   Last Assessment & Plan:  Formatting of this note might be different from the original. Moderate.   History of gout 08/27/2015   Hyperlipidemia    Hypertension    Hypothyroidism 09/13/2019   Formatting of this note might be different from the original. 2021: TSH 41   Impaired glucose tolerance    Lung nodule 01/25/2019   Formatting of this note is different from the original. Chest CT 01/18/2019 notes a stable 4 mm right middle lobe lung nodule- recheck chest CT August 2021   Lupus [facility])    Normal coronary arteries 06/14/2017   2014 cath in [facility]   Pacemaker    Paroxysmal atrial fibrillation (HCC) 11/19/2014   Xarelto Formatting of this note might be different from the original. Managed CARDS  Formatting of this note  might be different from the original. 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 05/2020: ORTHO, myelogram, surg rec   Suspected COVID-19 virus infection 09/25/2019   Formatting of this note might be different from the original. 2021   Thrombocytopenia (HCC) 05/15/2017   Type 2 diabetes mellitus without complication, without long-term current use of insulin (HCC) 08/27/2015   Formatting of this note might be different from the original. 2019: 99/5.3 2020: 125/5.7 2021: 126/6.6    Past Surgical History:  Procedure Laterality Date   CARDIAC CATHETERIZATION  2014   non-obs dz, done at [facility]   CARDIOVERSION N/A 09/02/2017   Procedure: CARDIOVERSION;  Surgeon: Lewayne Bunting, MD;  Location: [facility] ENDOSCOPY;  Service: Cardiovascular;  Laterality: N/A;   CARDIOVERSION N/A 10/29/2021   Procedure: CARDIOVERSION;  Surgeon: Meriam Sprague, MD;  Location: [facility] ENDOSCOPY;  Service: Cardiovascular;  Laterality: N/A;   COLONOSCOPY     EYE SURGERY     bilateral cataracts   PACEMAKER IMPLANT  03/2012   REPLACEMENT TOTAL KNEE Left    RIGHT/LEFT HEART CATH AND CORONARY ANGIOGRAPHY N/A 03/28/2018   Procedure: RIGHT/LEFT HEART CATH AND CORONARY ANGIOGRAPHY;  Surgeon: Lyn Records, MD;  Location: [facility] INVASIVE CV LAB;  Service: Cardiovascular;  Laterality: N/A;   TEE WITHOUT CARDIOVERSION N/A 05/12/2018   Procedure: TRANSESOPHAGEAL ECHOCARDIOGRAM (TEE);  Surgeon: Tonny Bollman, MD;  Location: [facility] OR;  Service: Open Heart Surgery;  Laterality: N/A;   TRANSCATHETER AORTIC VALVE REPLACEMENT, TRANSFEMORAL N/A 05/12/2018   Procedure: TRANSCATHETER AORTIC VALVE REPLACEMENT, TRANSFEMORAL;  Surgeon: Tonny Bollman, MD;  Location: [facility] OR;  Service: Open Heart Surgery;  Laterality:  N/A;   VASECTOMY      Current Medications: Current Meds  Medication Sig   acetaminophen (TYLENOL) 500 MG tablet Take 1,000 mg by mouth every 6 (six) hours as needed for moderate pain or headache.   amLODipine (NORVASC) 2.5 MG tablet Take 1 tablet (2.5 mg total) by mouth daily.   dofetilide (TIKOSYN) 500 MCG capsule TAKE 1 CAPSULE(500 MCG) BY MOUTH TWICE DAILY (Patient taking differently: 500 mcg 2 (two) times daily.)   levothyroxine (SYNTHROID) 88 MCG tablet Take 88 mcg by mouth daily before breakfast.   metoprolol tartrate (LOPRESSOR) 50 MG tablet TAKE 1 AND 1/2 TABLETS(75 MG) BY MOUTH TWICE DAILY (Patient taking differently: Take 50 mg by mouth 2 (two) times daily.)   Multiple Vitamins-Minerals (PRESERVISION AREDS 2+MULTI VIT) CAPS Take 2 capsules by mouth every morning.   nitroGLYCERIN (NITROSTAT) 0.4 MG SL tablet Place 0.4 mg under the tongue every 5 (five) minutes as needed for chest pain.   pantoprazole (PROTONIX) 40 MG tablet Take 1 tablet by mouth daily.   potassium chloride SA (KLOR-CON M) 20 MEQ tablet Take 1 tablet (20 mEq total) by mouth daily.   rosuvastatin (CRESTOR) 40 MG tablet Take 40 mg by  mouth daily.   XARELTO 20 MG TABS tablet TAKE 1 TABLET(20 MG) BY MOUTH DAILY WITH SUPPER (Patient taking differently: Take 20 mg by mouth daily with supper.)     Allergies:   Sulfamethoxazole-trimethoprim, Allopurinol, and Atorvastatin   Social History   Socioeconomic History   Marital status: Married    Spouse name: Not on file   Number of children: Not on file   Years of education: Not on file   Highest education level: Not on file  Occupational History   Occupation: retired  Tobacco Use   Smoking status: Former    Current packs/day: 0.00    Types: Cigarettes    Quit date: 1978    Years since quitting: 46.6   Smokeless tobacco: Never   Tobacco comments:    Former smoker 10/27/21  Vaping Use   Vaping status: Never Used  Substance and Sexual Activity   Alcohol use: No    Drug use: No   Sexual activity: Not on file  Other Topics Concern   Not on file  Social History Narrative   Pt lives in [place]   Social Determinants of Health   Financial Resource Strain: Medium Risk (04/18/2019)   [facility] visits prior to 07/24/2022., [facility] visits prior to 07/24/2022.   Overall Financial Resource Strain (CARDIA)    Difficulty of Paying Living Expenses: Somewhat hard  Food Insecurity: Low Risk  (10/25/2022)   [facility]   Food vital sign    Within the past 12 months, you worried that your food would run out before you got money to buy more: Not on file    Within the past 12 months, the food you bought just didn't last and you didn't have money to get more. : Never true  Transportation Needs: Not on file (10/25/2022)  Physical Activity: Inactive (04/18/2019)   [facility] visits prior to 07/24/2022., [facility] visits prior to 07/24/2022.   Exercise Vital Sign    Days of Exercise per Week: 0 days    Minutes of Exercise per Session: 0 min  Stress: No Stress Concern Present (04/18/2019)   [facility] prior to 07/24/2022., [facility] visits prior to 07/24/2022.   [employer] of Occupational Health - Occupational Stress Questionnaire    Feeling of Stress : Not at all  Social Connections: Socially Integrated (04/18/2019)   Received from [facility] visits prior to 07/24/2022., [facility] visits prior to 07/24/2022.   Social Advertising account executive [NHANES]    Frequency of Communication with Friends and Family: More than three times a week    Frequency of Social Gatherings with Friends and Family: Patient refused    Attends Religious Services: More than 4 times per year    Active Member of [employer] or  Organizations: Yes    Attends Banker Meetings: Patient refused    Marital Status: Married     Family History: The patient's family history includes CAD in his father; Colon cancer in his sister; Stroke in his father. ROS:   Please see the history of present illness.    All 14 point review of systems negative except as described per history of present illness  EKGs/Labs/Other Studies Reviewed:         Recent Labs: 07/02/2022: BUN 18; Creatinine, Ser 1.00;  Hemoglobin 12.4; Platelets 130; Potassium 4.5; Sodium 140  Recent Lipid Panel No results found for: "CHOL", "TRIG", "HDL", "CHOLHDL", "VLDL", "LDLCALC", "LDLDIRECT"  Physical Exam:    VS:  BP 128/70 (BP Location: Left Arm, Patient Position: Sitting)   Pulse 83   Ht 5\' 10"  (1.778 m)   Wt 237 lb 6.4 oz (107.7 kg)   SpO2 97%   BMI 34.06 kg/m     Wt Readings from Last 3 Encounters:  12/21/22 237 lb 6.4 oz (107.7 kg)  07/02/22 241 lb (109.3 kg)  06/22/22 245 lb 6.4 oz (111.3 kg)     GEN:  Well nourished, well developed in no acute distress HEENT: Normal NECK: No JVD; No carotid bruits LYMPHATICS: No lymphadenopathy CARDIAC: RRR, no murmurs, no rubs, no gallops RESPIRATORY:  Clear to auscultation without rales, wheezing or rhonchi  ABDOMEN: Soft, non-tender, non-distended MUSCULOSKELETAL:  No edema; No deformity  SKIN: Warm and dry LOWER EXTREMITIES: no swelling NEUROLOGIC:  Alert and oriented x 3 PSYCHIATRIC:  Normal affect   ASSESSMENT:    1. Coronary artery disease involving native coronary artery of native heart without angina pectoris   2. Paroxysmal atrial fibrillation (HCC)   3. Essential hypertension   4. S/P TAVR (transcatheter aortic valve replacement)   5. Presence of cardiac pacemaker    PLAN:    In order of problems listed above:  Status post aortic valve replacement there was mild aortic valve insufficiency, not critical.  Continue monitoring.  Overall he is asymptomatic. Paroxysmal  atrial fibrillation I did review interrogation of his device which showing no recurrences of atrial fibrillation.  He is on Tikosyn which I will continue, he is anticoagulated which I will continue. Essential hypertension blood pressure well-controlled continue present management. Dyslipidemia I did review blood test done month ago by his primary care physician, HDL 31, LDL 38.  Will continue monitoring. Pacemaker present interrogation reviewed normal function followed by our EP team   Medication Adjustments/Labs and Tests Ordered: Current medicines are reviewed at length with the patient today.  Concerns regarding medicines are outlined above.  No orders of the defined types were placed in this encounter.  Medication changes: No orders of the defined types were placed in this encounter.   Signed, Georgeanna Lea, MD, [facility] 12/21/2022 11:28 AM     [facility]

## 2022-12-21 NOTE — Patient Instructions (Signed)

## 2023-01-05 ENCOUNTER — Other Ambulatory Visit: Payer: Self-pay | Admitting: Cardiology

## 2023-01-17 ENCOUNTER — Ambulatory Visit: Payer: Medicare Other | Attending: Cardiology | Admitting: Cardiology

## 2023-01-17 ENCOUNTER — Encounter: Payer: Self-pay | Admitting: Cardiology

## 2023-01-17 VITALS — BP 130/64 | HR 68 | Ht 70.0 in | Wt 241.8 lb

## 2023-01-17 DIAGNOSIS — I4819 Other persistent atrial fibrillation: Secondary | ICD-10-CM | POA: Diagnosis not present

## 2023-01-17 DIAGNOSIS — Z79899 Other long term (current) drug therapy: Secondary | ICD-10-CM | POA: Diagnosis present

## 2023-01-17 DIAGNOSIS — I480 Paroxysmal atrial fibrillation: Secondary | ICD-10-CM | POA: Insufficient documentation

## 2023-01-17 DIAGNOSIS — I442 Atrioventricular block, complete: Secondary | ICD-10-CM | POA: Diagnosis not present

## 2023-01-17 DIAGNOSIS — Z950 Presence of cardiac pacemaker: Secondary | ICD-10-CM | POA: Insufficient documentation

## 2023-01-17 NOTE — Patient Instructions (Signed)
Medication Instructions:  Your physician recommends that you continue on your current medications as directed. Please refer to the Current Medication list given to you today.  *If you need a refill on your cardiac medications before your next appointment, please call your pharmacy*   Lab Work: Stop back by the office for Tikosyn surveillance labs:   BMET & Magnesium level  If you have labs (blood work) drawn today and your tests are completely normal, you will receive your results only by: MyChart Message (if you have MyChart) OR A paper copy in the mail If you have any lab test that is abnormal or we need to change your treatment, we will call you to review the results.   Testing/Procedures: None ordered   Follow-Up: At [facility], you and your health needs are our priority.  As part of our continuing mission to provide you with exceptional heart care, we have created designated Provider Care Teams.  These Care Teams include your primary Cardiologist (physician) and Advanced Practice Providers (APPs -  Physician Assistants and Nurse Practitioners) who all work together to provide you with the care you need, when you need it.  Remote monitoring is used to monitor your Pacemaker or ICD from home. This monitoring reduces the number of office visits required to check your device to one time per year. It allows [place] to keep an eye on the functioning of your device to ensure it is working properly. You are scheduled for a device check from home on 03/07/23. You may send your transmission at any time that day. If you have a wireless device, the transmission will be sent automatically. After your physician reviews your transmission, you will receive a postcard with your next transmission date.  Your next appointment:   6 month(s)  The format for your next appointment:   In Person  Provider:   Loman Brooklyn, MD    Thank you for choosing [facility]!!   Dory Horn, RN [phone]

## 2023-01-17 NOTE — Progress Notes (Signed)
  Electrophysiology Office Note:   Date:  01/17/2023  ID:  Frederick Vasquez, DOB 07/15/1940, MRN [id number]  Primary Cardiologist: Gypsy Balsam, MD Electrophysiologist: Regan Lemming, MD      History of Present Illness:   Frederick Vasquez is a 82 y.o. male with h/o real fibrillation, complete heart block seen today for routine electrophysiology followup.  Since last being seen in our clinic the patient reports doing well.  He has had no further episodes of atrial fibrillation.  He is been able to do all of his daily activities.  he denies chest pain, palpitations, dyspnea, PND, orthopnea, nausea, vomiting, dizziness, syncope, edema, weight gain, or early satiety.   Review of systems complete and found to be negative unless listed in HPI.      EP Information / Studies Reviewed:    EKG is ordered today. Personal review as below.   AV paced  PPM Interrogation-  reviewed in detail today,  See PACEART report.  Device History: Medtronic Dual Chamber PPM implanted for CHB  Risk Assessment/Calculations:    CHA2DS2-VASc Score = 5   This indicates a 7.2% annual risk of stroke. The patient's score is based upon: CHF History: 0 HTN History: 1 Diabetes History: 1 Stroke History: 0 Vascular Disease History: 1 Age Score: 2 Gender Score: 0             Physical Exam:   VS:  BP 130/64 (BP Location: Left Arm, Patient Position: Sitting, Cuff Size: Large)   Pulse 68   Ht 5\' 10"  (1.778 m)   Wt 241 lb 12.8 oz (109.7 kg)   SpO2 97%   BMI 34.69 kg/m    Wt Readings from Last 3 Encounters:  01/17/23 241 lb 12.8 oz (109.7 kg)  12/21/22 237 lb 6.4 oz (107.7 kg)  07/02/22 241 lb (109.3 kg)     GEN: Well nourished, well developed in no acute distress NECK: No JVD; No carotid bruits CARDIAC: Regular rate and rhythm, no murmurs, rubs, gallops RESPIRATORY:  Clear to auscultation without rales, wheezing or rhonchi  ABDOMEN: Soft, non-tender, non-distended EXTREMITIES:  No edema; No  deformity   ASSESSMENT AND PLAN:    CHB s/p Medtronic PPM  Normal PPM function See Pace Art report No changes today  2.  Paroxysmal atrial fibrillation: Currently on Xarelto and dofetilide.  Remains in sinus rhythm.  No changes.  3.  Secondary hypercoagulable state: Currently on Xarelto for atrial fibrillation  4.  High risk medication monitoring: Currently on dofetilide.  QTc remained stable.  Zoriah Pulice check BMP and magnesium today.  5.  Moderate aortic stenosis: Post TAVR.  Followed by primary cardiology  Disposition:   Follow up with Dr. Elberta Fortis in 6 months  Signed, Rakiyah Esch Jorja Loa, MD

## 2023-01-19 LAB — CUP PACEART INCLINIC DEVICE CHECK
Battery Impedance: 2370 Ohm
Battery Remaining Longevity: 27 mo
Battery Voltage: 2.73 V
Brady Statistic AP VP Percent: 61 %
Brady Statistic AP VS Percent: 0 %
Brady Statistic AS VP Percent: 39 %
Brady Statistic AS VS Percent: 0 %
Date Time Interrogation Session: 20240826121800
Implantable Lead Connection Status: 753985
Implantable Lead Connection Status: 753985
Implantable Lead Implant Date: 20131113
Implantable Lead Implant Date: 20131113
Implantable Lead Location: 753859
Implantable Lead Location: 753860
Implantable Lead Model: 5076
Implantable Lead Model: 5092
Implantable Pulse Generator Implant Date: 20131113
Lead Channel Impedance Value: 408 Ohm
Lead Channel Impedance Value: 678 Ohm
Lead Channel Pacing Threshold Amplitude: 0.5 V
Lead Channel Pacing Threshold Amplitude: 0.625 V
Lead Channel Pacing Threshold Pulse Width: 0.4 ms
Lead Channel Pacing Threshold Pulse Width: 0.4 ms
Lead Channel Sensing Intrinsic Amplitude: 4 mV
Lead Channel Setting Pacing Amplitude: 2 V
Lead Channel Setting Pacing Amplitude: 2.5 V
Lead Channel Setting Pacing Pulse Width: 0.4 ms
Lead Channel Setting Sensing Sensitivity: 4 mV
Zone Setting Status: 755011
Zone Setting Status: 755011

## 2023-01-19 LAB — MAGNESIUM: Magnesium: 1.9 mg/dL (ref 1.6–2.3)

## 2023-01-19 LAB — BASIC METABOLIC PANEL
BUN/Creatinine Ratio: 23 (ref 10–24)
BUN: 18 mg/dL (ref 8–27)
CO2: 25 mmol/L (ref 20–29)
Calcium: 9.5 mg/dL (ref 8.6–10.2)
Chloride: 103 mmol/L (ref 96–106)
Creatinine, Ser: 0.78 mg/dL (ref 0.76–1.27)
Glucose: 146 mg/dL — ABNORMAL HIGH (ref 70–99)
Potassium: 4.3 mmol/L (ref 3.5–5.2)
Sodium: 138 mmol/L (ref 134–144)
eGFR: 90 mL/min/{1.73_m2} (ref >59)

## 2023-01-21 ENCOUNTER — Other Ambulatory Visit: Payer: Self-pay | Admitting: Cardiology

## 2023-03-07 ENCOUNTER — Ambulatory Visit (INDEPENDENT_AMBULATORY_CARE_PROVIDER_SITE_OTHER): Payer: Medicare Other

## 2023-03-07 DIAGNOSIS — I442 Atrioventricular block, complete: Secondary | ICD-10-CM | POA: Diagnosis not present

## 2023-03-08 LAB — CUP PACEART REMOTE DEVICE CHECK
Battery Impedance: 2505 Ohm
Battery Remaining Longevity: 26 mo
Battery Voltage: 2.73 V
Brady Statistic AP VP Percent: 61 %
Brady Statistic AP VS Percent: 0 %
Brady Statistic AS VP Percent: 39 %
Brady Statistic AS VS Percent: 0 %
Date Time Interrogation Session: 20241015141103
Implantable Lead Connection Status: 753985
Implantable Lead Connection Status: 753985
Implantable Lead Implant Date: 20131113
Implantable Lead Implant Date: 20131113
Implantable Lead Location: 753859
Implantable Lead Location: 753860
Implantable Lead Model: 5076
Implantable Lead Model: 5092
Implantable Pulse Generator Implant Date: 20131113
Lead Channel Impedance Value: 376 Ohm
Lead Channel Impedance Value: 746 Ohm
Lead Channel Pacing Threshold Amplitude: 0.5 V
Lead Channel Pacing Threshold Amplitude: 0.625 V
Lead Channel Pacing Threshold Pulse Width: 0.4 ms
Lead Channel Pacing Threshold Pulse Width: 0.4 ms
Lead Channel Setting Pacing Amplitude: 2 V
Lead Channel Setting Pacing Amplitude: 2.5 V
Lead Channel Setting Pacing Pulse Width: 0.4 ms
Lead Channel Setting Sensing Sensitivity: 4 mV
Zone Setting Status: 755011
Zone Setting Status: 755011

## 2023-03-19 ENCOUNTER — Other Ambulatory Visit: Payer: Self-pay | Admitting: Cardiology

## 2023-03-23 NOTE — Progress Notes (Signed)
Remote pacemaker transmission.   

## 2023-06-06 ENCOUNTER — Ambulatory Visit (INDEPENDENT_AMBULATORY_CARE_PROVIDER_SITE_OTHER): Payer: Medicare Other

## 2023-06-06 DIAGNOSIS — I442 Atrioventricular block, complete: Secondary | ICD-10-CM

## 2023-06-06 LAB — CUP PACEART REMOTE DEVICE CHECK
Battery Impedance: 2634 Ohm
Battery Remaining Longevity: 24 mo
Battery Voltage: 2.73 V
Brady Statistic AP VP Percent: 59 %
Brady Statistic AP VS Percent: 0 %
Brady Statistic AS VP Percent: 41 %
Brady Statistic AS VS Percent: 0 %
Date Time Interrogation Session: 20250113084835
Implantable Lead Connection Status: 753985
Implantable Lead Connection Status: 753985
Implantable Lead Implant Date: 20131113
Implantable Lead Implant Date: 20131113
Implantable Lead Location: 753859
Implantable Lead Location: 753860
Implantable Lead Model: 5076
Implantable Lead Model: 5092
Implantable Pulse Generator Implant Date: 20131113
Lead Channel Impedance Value: 376 Ohm
Lead Channel Impedance Value: 696 Ohm
Lead Channel Pacing Threshold Amplitude: 0.5 V
Lead Channel Pacing Threshold Amplitude: 0.625 V
Lead Channel Pacing Threshold Pulse Width: 0.4 ms
Lead Channel Pacing Threshold Pulse Width: 0.4 ms
Lead Channel Setting Pacing Amplitude: 2 V
Lead Channel Setting Pacing Amplitude: 2.5 V
Lead Channel Setting Pacing Pulse Width: 0.4 ms
Lead Channel Setting Sensing Sensitivity: 4 mV
Zone Setting Status: 755011
Zone Setting Status: 755011

## 2023-06-22 ENCOUNTER — Encounter: Payer: Self-pay | Admitting: Cardiology

## 2023-06-22 ENCOUNTER — Ambulatory Visit: Payer: Medicare Other | Attending: Cardiology | Admitting: Cardiology

## 2023-06-22 VITALS — BP 136/74 | HR 88 | Ht 69.0 in | Wt 249.0 lb

## 2023-06-22 DIAGNOSIS — Z7901 Long term (current) use of anticoagulants: Secondary | ICD-10-CM | POA: Insufficient documentation

## 2023-06-22 DIAGNOSIS — Z952 Presence of prosthetic heart valve: Secondary | ICD-10-CM | POA: Insufficient documentation

## 2023-06-22 DIAGNOSIS — I480 Paroxysmal atrial fibrillation: Secondary | ICD-10-CM | POA: Insufficient documentation

## 2023-06-22 DIAGNOSIS — I10 Essential (primary) hypertension: Secondary | ICD-10-CM | POA: Insufficient documentation

## 2023-06-22 DIAGNOSIS — Z950 Presence of cardiac pacemaker: Secondary | ICD-10-CM | POA: Insufficient documentation

## 2023-06-22 NOTE — Addendum Note (Signed)
Addended by: Baldo Ash D on: 06/22/2023 09:43 AM   Modules accepted: Orders

## 2023-06-22 NOTE — Patient Instructions (Signed)
Medication Instructions:  Your physician recommends that you continue on your current medications as directed. Please refer to the Current Medication list given to you today.  *If you need a refill on your cardiac medications before your next appointment, please call your pharmacy*   Lab Work: None Ordered If you have labs (blood work) drawn today and your tests are completely normal, you will receive your results only by: MyChart Message (if you have MyChart) OR A paper copy in the mail If you have any lab test that is abnormal or we need to change your treatment, we will call you to review the results.   Testing/Procedures: Your physician has requested that you have an echocardiogram. Echocardiography is a painless test that uses sound waves to create images of your heart. It provides your doctor with information about the size and shape of your heart and how well your heart's chambers and valves are working. This procedure takes approximately one hour. There are no restrictions for this procedure. Please do NOT wear cologne, perfume, aftershave, or lotions (deodorant is allowed). Please arrive 15 minutes prior to your appointment time.  Please note: We ask at that you not bring children with you during ultrasound (echo/ vascular) testing. Due to room size and safety concerns, children are not allowed in the ultrasound rooms during exams. Our front office staff cannot provide observation of children in our lobby area while testing is being conducted. An adult accompanying a patient to their appointment will only be allowed in the ultrasound room at the discretion of the ultrasound technician under special circumstances. We apologize for any inconvenience.    Follow-Up: At [facility] you and your health needs are our priority.  As part of our continuing mission to provide you with exceptional heart care, we have created designated Provider Care Teams.  These Care Teams include your  primary Cardiologist (physician) and Advanced Practice Providers (APPs -  Physician Assistants and Nurse Practitioners) who all work together to provide you with the care you need, when you need it.  We recommend signing up for the patient portal called "MyChart".  Sign up information is provided on this After Visit Summary.  MyChart is used to connect with patients for Virtual Visits (Telemedicine).  Patients are able to view lab/test results, encounter notes, upcoming appointments, etc.  Non-urgent messages can be sent to your provider as well.   To learn more about what you can do with MyChart, go to [email or web address].    Your next appointment:   6 month(s)  The format for your next appointment:   In Person  Provider:   Gypsy Balsam, MD    Other Instructions NA

## 2023-06-22 NOTE — Progress Notes (Signed)
Cardiology Office Note:    Date:  06/22/2023   ID:  Frederick Vasquez, DOB 1940/12/18, MRN [id number]  PCP:  Olive Bass, MD  Cardiologist:  Gypsy Balsam, MD    Referring MD: Olive Bass, MD   Chief Complaint  Patient presents with   Follow-up    History of Present Illness:    Frederick Vasquez is a 83 y.o. male past medical history significant for critical severe aortic stenosis, status post TAVI done in 2019 with 26 mm Edwards SAPIEN S3 valve, paroxysmal atrial fibrillation successfully suppressed with Tikosyn, anticoagulated, essential hypertension, dyslipidemia.  Comes today to months for follow-up.  Overall doing well.  He said that he is trying to be active because of cold weather he spent majority of time sitting at home but tried to go outside as much as possible.  Denies having any unusual chest pain tightness squeezing pressure burning chest.  Past Medical History:  Diagnosis Date   Acute prostatitis 06/06/2020   Formatting of this note might be different from the original. 06/06/2020   Aortic stenosis 05/15/2017   Severe by echo from October 2019  Formatting of this note might be different from the original. Formatting of this note might be different from the original. 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 Chest CT 01/18/2019 2019: [employer] exposure 2019: CXR: IMPRESSION: 1. 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 2. No active infiltrate or effusion. 2019: PULM eval   Atrial fibrillation (HCC)    Xarelto   Balanitis 04/10/2020   Formatting of this note might be different from the original. 04/10/2020   Chronic anticoagulation 06/14/2017   CHADS VASC=4   Chronic diastolic CHF (congestive heart failure) (HCC) 05/15/2017   Contact with and (suspected) exposure to asbestos  11/17/2017   Formatting of this note might be different from the original. 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 2021   Diastolic heart failure (HCC)    Dyslipidemia 11/19/2014   Dysrhythmia    A- Fib   Elevated troponin 05/15/2017   Encounter for administration of vaccine 11/19/2014   Essential hypertension 05/15/2017   Exacerbation of chronic bronchiolitis (HCC) 02/28/2020   Formatting of this note might be different from the original. 02/28/2020   Gastrointestinal hemorrhage 09/25/2019   Formatting of this note might be different from the original. 2021   Hemoptysis 05/15/2017   Formatting of this note might be different from the original. 2019   History of aortic valvular stenosis 02/26/2016   Last Assessment & Plan:  Formatting of this note might be different from the original. Moderate.   History of gout 08/27/2015   Hyperlipidemia    Hypertension    Hypothyroidism 09/13/2019   Formatting of this note might be different from the original. 2021: TSH 41   Impaired glucose tolerance    Lung nodule 01/25/2019   Formatting of this note is different from the original. Chest CT 01/18/2019 notes a stable 4 mm right middle lobe lung nodule- recheck chest CT August 2021   Lupus    Normal coronary arteries 06/14/2017   2014 [facility] in [facility]   Pacemaker  Paroxysmal atrial fibrillation (HCC) 11/19/2014   Xarelto Formatting of this note might be different from the original. Managed CARDS  Formatting of this note might be different from the original. 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 05/2020: ORTHO, myelogram, surg rec   Suspected COVID-19 virus infection 09/25/2019   Formatting of this note might be different from the original. 2021   Thrombocytopenia (HCC) 05/15/2017   Type 2 diabetes mellitus without complication, without long-term current use of insulin (HCC) 08/27/2015   Formatting of this note might be different from the original. 2019: 99/5.3 2020: 125/5.7 2021: 126/6.6    Past Surgical History:  Procedure Laterality Date   CARDIAC CATHETERIZATION  2014   non-obs dz, done at [facility]   CARDIOVERSION N/A 09/02/2017   Procedure: CARDIOVERSION;  Surgeon: Lewayne Bunting, MD;  Location: [facility] ENDOSCOPY;  Service: Cardiovascular;  Laterality: N/A;   CARDIOVERSION N/A 10/29/2021   Procedure: CARDIOVERSION;  Surgeon: Meriam Sprague, MD;  Location: [facility] ENDOSCOPY;  Service: Cardiovascular;  Laterality: N/A;   COLONOSCOPY     EYE SURGERY     bilateral cataracts   PACEMAKER IMPLANT  03/2012   REPLACEMENT TOTAL KNEE Left    RIGHT/LEFT HEART CATH AND CORONARY ANGIOGRAPHY N/A 03/28/2018   Procedure: RIGHT/LEFT HEART CATH AND CORONARY ANGIOGRAPHY;  Surgeon: Lyn Records, MD;  Location: [facility] INVASIVE CV LAB;  Service: Cardiovascular;  Laterality: N/A;   TEE WITHOUT CARDIOVERSION N/A 05/12/2018   Procedure: TRANSESOPHAGEAL ECHOCARDIOGRAM (TEE);  Surgeon: Tonny Bollman, MD;  Location: [facility] OR;  Service: Open Heart Surgery;  Laterality: N/A;   TRANSCATHETER AORTIC VALVE REPLACEMENT, TRANSFEMORAL N/A 05/12/2018   Procedure: TRANSCATHETER  AORTIC VALVE REPLACEMENT, TRANSFEMORAL;  Surgeon: Tonny Bollman, MD;  Location: [facility] OR;  Service: Open Heart Surgery;  Laterality: N/A;   VASECTOMY      Current Medications: Current Meds  Medication Sig   acetaminophen (TYLENOL) 500 MG tablet Take 1,000 mg by mouth every 6 (six) hours as needed for moderate pain or headache.   amLODipine (NORVASC) 2.5 MG tablet Take 1 tablet (2.5 mg total) by mouth daily.   dofetilide (TIKOSYN) 500 MCG capsule TAKE 1 CAPSULE(500 MCG) BY MOUTH TWICE DAILY (Patient taking differently: Take 500 mcg by mouth 2 (two) times daily.)   levothyroxine (SYNTHROID) 88 MCG tablet Take 88 mcg by mouth daily before breakfast.   metoprolol tartrate (LOPRESSOR) 50 MG tablet Take 1.5 tablets (75 mg total) by mouth 2 (two) times daily.   Multiple Vitamins-Minerals (PRESERVISION AREDS 2+MULTI VIT) CAPS Take 2 capsules by mouth every morning.   nitroGLYCERIN (NITROSTAT) 0.4 MG SL tablet Place 0.4 mg under the tongue every 5 (five) minutes as needed for chest pain.   pantoprazole (PROTONIX) 40 MG tablet Take 1 tablet by mouth daily.   potassium chloride SA (KLOR-CON M) 20 MEQ tablet Take 1  tablet (20 mEq total) by mouth daily.   rosuvastatin (CRESTOR) 40 MG tablet Take 40 mg by mouth daily.   XARELTO 20 MG TABS tablet TAKE 1 TABLET(20 MG) BY MOUTH DAILY WITH SUPPER (Patient taking differently: Take 20 mg by mouth daily with supper.)     Allergies:   Sulfamethoxazole-trimethoprim, Allopurinol, and Atorvastatin   Social History   Socioeconomic History   Marital status: Married    Spouse name: Not on file   Number of children: Not on file   Years of education: Not on file   Highest education level: Not on file  Occupational History   Occupation: retired  Tobacco Use   Smoking status: Former    Current packs/day: 0.00    Types: Cigarettes    Quit date: 1978    Years since quitting: 47.1   Smokeless tobacco: Never   Tobacco comments:    Former smoker 10/27/21  Vaping  Use   Vaping status: Never Used  Substance and Sexual Activity   Alcohol use: No   Drug use: No   Sexual activity: Not on file  Other Topics Concern   Not on file  Social History Narrative   Pt lives in [place]   Social Drivers of Health   Financial Resource Strain: Medium Risk (04/18/2019)   Received from [facility] visits prior to 07/24/2022., [facility] visits prior to 07/24/2022.   Overall Financial Resource Strain (CARDIA)    Difficulty of Paying Living Expenses: Somewhat hard  Food Insecurity: Low Risk  (05/26/2023)   [facility]   Hunger Vital Sign    Worried About Running Out of Food in the Last Year: Never true    Ran Out of Food in the Last Year: Never true  Transportation Needs: No Transportation Needs (05/26/2023)   Received from [employer]    In the past 12 months, has lack of reliable transportation kept you from medical appointments, meetings, work or from getting things needed for daily living? : No  Physical Activity: Inactive (04/18/2019)   [facility] visits prior to 07/24/2022., [facility] visits prior to 07/24/2022.   Exercise Vital Sign    Days of Exercise per Week: 0 days    Minutes of Exercise per Session: 0 min  Stress: No Stress Concern Present (04/18/2019)   [facility] prior to 07/24/2022., [facility] visits prior to 07/24/2022.   [employer] of Occupational Health - Occupational Stress Questionnaire    Feeling of Stress : Not at all  Social Connections: Socially Integrated (04/18/2019)   Received from [facility] visits prior to 07/24/2022., [facility] visits prior to 07/24/2022.   Social Advertising account executive [NHANES]    Frequency of Communication with Friends and Family: More than three times a week     Frequency of Social Gatherings with Friends and Family: Patient refused    Attends Religious Services: More than 4 times per year    Active Member of [employer] or Organizations: Yes    Attends Banker Meetings: Patient refused    Marital Status: Married     Family History: The patient's family history includes CAD in his father; Colon cancer in his sister; Stroke in his father. ROS:   Please see the history of present illness.    All 14 point review  of systems negative except as described per history of present illness  EKGs/Labs/Other Studies Reviewed:    EKG Interpretation Date/Time:  Wednesday June 22 2023 09:08:27 EST Ventricular Rate:  68 PR Interval:  222 QRS Duration:  212 QT Interval:  530 QTC Calculation: 563 R Axis:   -66  Text Interpretation: AV dual-paced rhythm with prolonged AV conduction Abnormal ECG When compared with ECG of 17-Jan-2023 12:10, No significant change was found Confirmed by Gypsy Balsam [phone]) on 06/22/2023 9:28:53 AM    Recent Labs: 07/02/2022: Hemoglobin 12.4; Platelets 130 01/18/2023: BUN 18; Creatinine, Ser 0.78; Magnesium 1.9; Potassium 4.3; Sodium 138  Recent Lipid Panel No results found for: "CHOL", "TRIG", "HDL", "CHOLHDL", "VLDL", "LDLCALC", "LDLDIRECT"  Physical Exam:    VS:  BP 136/74 (BP Location: Left Arm, Patient Position: Sitting)   Pulse 88   Ht 5\' 9"  (1.753 m)   Wt 249 lb (112.9 kg)   SpO2 96%   BMI 36.77 kg/m     Wt Readings from Last 3 Encounters:  06/22/23 249 lb (112.9 kg)  01/17/23 241 lb 12.8 oz (109.7 kg)  12/21/22 237 lb 6.4 oz (107.7 kg)     GEN:  Well nourished, well developed in no acute distress HEENT: Normal NECK: No JVD; No carotid bruits LYMPHATICS: No lymphadenopathy CARDIAC: RRR, no murmurs, no rubs, no gallops RESPIRATORY:  Clear to auscultation without rales, wheezing or rhonchi  ABDOMEN: Soft, non-tender, non-distended MUSCULOSKELETAL:  No edema; No deformity  SKIN: Warm and  dry LOWER EXTREMITIES: no swelling NEUROLOGIC:  Alert and oriented x 3 PSYCHIATRIC:  Normal affect   ASSESSMENT:    1. Paroxysmal atrial fibrillation (HCC)   2. S/P TAVR (transcatheter aortic valve replacement)   3. Presence of cardiac pacemaker   4. Chronic anticoagulation   5. Primary hypertension    PLAN:    In order of problems listed above:  Paroxysmal atrial fibrillation: Maintained sinus rhythm based on EKG with Dr. Today, interrogation of device show very rare episode of atrial fibrillation.  Continue anticoagulation. Status post TAVI, will repeat echocardiogram to check the function of the valve. Pacemaker present interrogation reviewed normal function.  It is a Medtronic device, has 24 months left and battery Chronic anticoagulation.  Noted. Essential hypertension blood pressure well-controlled   Medication Adjustments/Labs and Tests Ordered: Current medicines are reviewed at length with the patient today.  Concerns regarding medicines are outlined above.  Orders Placed This Encounter  Procedures   EKG 12-Lead   Medication changes: No orders of the defined types were placed in this encounter.   Signed, Georgeanna Lea, MD, [facility] 06/22/2023 9:35 AM    [facility]

## 2023-07-12 ENCOUNTER — Ambulatory Visit: Payer: Medicare Other | Attending: Cardiology

## 2023-07-12 DIAGNOSIS — Z952 Presence of prosthetic heart valve: Secondary | ICD-10-CM | POA: Insufficient documentation

## 2023-07-12 LAB — ECHOCARDIOGRAM COMPLETE
AV Mean grad: 14.8 mm[Hg]
AV Peak grad: 27.6 mm[Hg]
Ao pk vel: 2.63 m/s
Area-P 1/2: 2.58 cm2
MV M vel: 5.54 m/s
MV Peak grad: 122.8 mm[Hg]
S' Lateral: 3.4 cm

## 2023-07-18 ENCOUNTER — Other Ambulatory Visit: Payer: Self-pay | Admitting: Cardiology

## 2023-07-18 ENCOUNTER — Ambulatory Visit: Payer: Medicare Other | Attending: Cardiology | Admitting: Cardiology

## 2023-07-18 ENCOUNTER — Encounter: Payer: Self-pay | Admitting: Cardiology

## 2023-07-18 VITALS — BP 144/72 | HR 70 | Ht 69.0 in | Wt 249.0 lb

## 2023-07-18 DIAGNOSIS — Z79899 Other long term (current) drug therapy: Secondary | ICD-10-CM | POA: Diagnosis not present

## 2023-07-18 DIAGNOSIS — I442 Atrioventricular block, complete: Secondary | ICD-10-CM | POA: Insufficient documentation

## 2023-07-18 DIAGNOSIS — I480 Paroxysmal atrial fibrillation: Secondary | ICD-10-CM | POA: Insufficient documentation

## 2023-07-18 DIAGNOSIS — D6869 Other thrombophilia: Secondary | ICD-10-CM | POA: Diagnosis present

## 2023-07-18 LAB — CUP PACEART INCLINIC DEVICE CHECK
Battery Impedance: 2781 Ohm
Battery Remaining Longevity: 23 mo
Battery Voltage: 2.72 V
Brady Statistic AP VP Percent: 58 %
Brady Statistic AP VS Percent: 0 %
Brady Statistic AS VP Percent: 42 %
Brady Statistic AS VS Percent: 0 %
Date Time Interrogation Session: 20250224143026
Implantable Lead Connection Status: 753985
Implantable Lead Connection Status: 753985
Implantable Lead Implant Date: 20131113
Implantable Lead Implant Date: 20131113
Implantable Lead Location: 753859
Implantable Lead Location: 753860
Implantable Lead Model: 5076
Implantable Lead Model: 5092
Implantable Pulse Generator Implant Date: 20131113
Lead Channel Impedance Value: 393 Ohm
Lead Channel Impedance Value: 738 Ohm
Lead Channel Pacing Threshold Amplitude: 0.5 V
Lead Channel Pacing Threshold Amplitude: 0.625 V
Lead Channel Pacing Threshold Pulse Width: 0.4 ms
Lead Channel Pacing Threshold Pulse Width: 0.4 ms
Lead Channel Sensing Intrinsic Amplitude: 4 mV
Lead Channel Setting Pacing Amplitude: 2 V
Lead Channel Setting Pacing Amplitude: 2.5 V
Lead Channel Setting Pacing Pulse Width: 0.4 ms
Lead Channel Setting Sensing Sensitivity: 4 mV
Zone Setting Status: 755011
Zone Setting Status: 755011

## 2023-07-18 MED ORDER — DOFETILIDE 500 MCG PO CAPS: 500.0000 ug | ORAL_CAPSULE | Freq: Two times a day (BID) | ORAL | 2 refills | Status: DC

## 2023-07-18 NOTE — Progress Notes (Signed)
  Electrophysiology Office Note:   Date:  07/18/2023  ID:  Frederick Vasquez, DOB 1941/01/16, MRN [id number]  Primary Cardiologist: Gypsy Balsam, MD Primary Heart Failure: None Electrophysiologist: Jawaan Adachi Jorja Loa, MD      History of Present Illness:   Frederick Vasquez is a 83 y.o. male with h/o atrial fibrillation, complete heart block seen today for routine electrophysiology followup.   Since last being seen in our clinic the patient reports doing overall well.  He does have some mild shortness of breath when he exerts himself, but otherwise has no acute complaints.  He has had minimal episodes of atrial fibrillation with a burden less than 0.1%.  Recent echo shows a normal ejection fraction.  He has no acute complaints at this time.  he denies chest pain, palpitations, dyspnea, PND, orthopnea, nausea, vomiting, dizziness, syncope, edema, weight gain, or early satiety.   Review of systems complete and found to be negative unless listed in HPI.      EP Information / Studies Reviewed:    EKG is not ordered today. EKG from 06/22/23 reviewed which showed AV paced      PPM Interrogation-  reviewed in detail today,  See PACEART report.  Device History: Medtronic Dual Chamber PPM implanted for CHB  Risk Assessment/Calculations:    CHA2DS2-VASc Score = 5   This indicates a 7.2% annual risk of stroke. The patient's score is based upon: CHF History: 0 HTN History: 1 Diabetes History: 1 Stroke History: 0 Vascular Disease History: 1 Age Score: 2 Gender Score: 0            Physical Exam:   VS:  BP (!) 144/72   Pulse 70   Ht 5\' 9"  (1.753 m)   Wt 249 lb (112.9 kg)   SpO2 96%   BMI 36.77 kg/m    Wt Readings from Last 3 Encounters:  07/18/23 249 lb (112.9 kg)  06/22/23 249 lb (112.9 kg)  01/17/23 241 lb 12.8 oz (109.7 kg)     GEN: Well nourished, well developed in no acute distress NECK: No JVD; No carotid bruits CARDIAC: Regular rate and rhythm, no murmurs, rubs,  gallops RESPIRATORY:  Clear to auscultation without rales, wheezing or rhonchi  ABDOMEN: Soft, non-tender, non-distended EXTREMITIES:  No edema; No deformity   ASSESSMENT AND PLAN:    CHB s/p Medtronic PPM  Normal PPM function See Pace Art report Sensing, threshold, impedance stable and within normal limits No changes today  2.  Paroxysmal atrial fibrillation: Currently on dofetilide 500 mcg twice daily.  Low burden of atrial fibrillation based on pacemaker interrogation.  Continue current management.  3.  High risk medication monitoring: Currently on dofetilide.  QTc is remained stable.  Evalene Vath check BMP and magnesium today  4.  Secondary hypercoagulable state: Currently on Xarelto for atrial fibrillation  5.  Aortic stenosis: Post TAVR.  Followed by primary cardiology  Disposition:   Follow up with Dr. Elberta Fortis in 6 months  Signed, Anhthu Perdew Jorja Loa, MD

## 2023-07-18 NOTE — Patient Instructions (Signed)
 Medication Instructions:  Your physician recommends that you continue on your current medications as directed. Please refer to the Current Medication list given to you today.  *If you need a refill on your cardiac medications before your next appointment, please call your pharmacy*   Lab Work: Today, Tikosyn surveillance labs:  BMET & Magnesium level  If you have any lab test that is abnormal or we need to change your treatment, we will call you to review the results.   Testing/Procedures: None ordered   Follow-Up: At [facility], you and your health needs are our priority.  As part of our continuing mission to provide you with exceptional heart care, we have created designated Provider Care Teams.  These Care Teams include your primary Cardiologist (physician) and Advanced Practice Providers (APPs -  Physician Assistants and Nurse Practitioners) who all work together to provide you with the care you need, when you need it.  Your next appointment:   6 month(s)  The format for your next appointment:   In Person  Provider:   Loman Brooklyn, MD    Thank you for choosing [facility]!!   Dory Horn, RN [phone]

## 2023-07-19 LAB — BASIC METABOLIC PANEL
BUN/Creatinine Ratio: 17 (ref 10–24)
BUN: 17 mg/dL (ref 8–27)
CO2: 21 mmol/L (ref 20–29)
Calcium: 8.9 mg/dL (ref 8.6–10.2)
Chloride: 104 mmol/L (ref 96–106)
Creatinine, Ser: 1.01 mg/dL (ref 0.76–1.27)
Glucose: 175 mg/dL — ABNORMAL HIGH (ref 70–99)
Potassium: 4.2 mmol/L (ref 3.5–5.2)
Sodium: 140 mmol/L (ref 134–144)
eGFR: 75 mL/min/{1.73_m2} (ref >59)

## 2023-07-19 LAB — CBC
Hematocrit: 35 % — ABNORMAL LOW (ref 37.5–51.0)
Hemoglobin: 11.3 g/dL — ABNORMAL LOW (ref 13.0–17.7)
MCH: 29.7 pg (ref 26.6–33.0)
MCHC: 32.3 g/dL (ref 31.5–35.7)
MCV: 92 fL (ref 79–97)
Platelets: 137 10*3/uL — ABNORMAL LOW (ref 150–450)
RBC: 3.81 x10E6/uL — ABNORMAL LOW (ref 4.14–5.80)
RDW: 13.5 % (ref 11.6–15.4)
WBC: 4.8 10*3/uL (ref 3.4–10.8)

## 2023-07-19 NOTE — Progress Notes (Signed)
 Remote pacemaker transmission.

## 2023-07-21 ENCOUNTER — Telehealth: Payer: Self-pay

## 2023-07-21 NOTE — Telephone Encounter (Signed)
 Echo Results reviewed with pt as per Dr. Vanetta Shawl note.  Pt verbalized understanding and had no additional questions. Routed to PCP

## 2023-08-10 ENCOUNTER — Other Ambulatory Visit: Payer: Self-pay | Admitting: Cardiology

## 2023-08-16 ENCOUNTER — Other Ambulatory Visit: Payer: Self-pay | Admitting: Cardiology

## 2023-09-05 ENCOUNTER — Ambulatory Visit (INDEPENDENT_AMBULATORY_CARE_PROVIDER_SITE_OTHER): Payer: Medicare Other

## 2023-09-05 DIAGNOSIS — I442 Atrioventricular block, complete: Secondary | ICD-10-CM | POA: Diagnosis not present

## 2023-09-07 LAB — CUP PACEART REMOTE DEVICE CHECK
Battery Impedance: 2838 Ohm
Battery Remaining Longevity: 22 mo
Battery Voltage: 2.72 V
Brady Statistic AP VP Percent: 61 %
Brady Statistic AP VS Percent: 0 %
Brady Statistic AS VP Percent: 39 %
Brady Statistic AS VS Percent: 0 %
Date Time Interrogation Session: 20250414091107
Implantable Lead Connection Status: 753985
Implantable Lead Connection Status: 753985
Implantable Lead Implant Date: 20131113
Implantable Lead Implant Date: 20131113
Implantable Lead Location: 753859
Implantable Lead Location: 753860
Implantable Lead Model: 5076
Implantable Lead Model: 5092
Implantable Pulse Generator Implant Date: 20131113
Lead Channel Impedance Value: 387 Ohm
Lead Channel Impedance Value: 694 Ohm
Lead Channel Pacing Threshold Amplitude: 0.5 V
Lead Channel Pacing Threshold Amplitude: 0.625 V
Lead Channel Pacing Threshold Pulse Width: 0.4 ms
Lead Channel Pacing Threshold Pulse Width: 0.4 ms
Lead Channel Setting Pacing Amplitude: 2 V
Lead Channel Setting Pacing Amplitude: 2.5 V
Lead Channel Setting Pacing Pulse Width: 0.4 ms
Lead Channel Setting Sensing Sensitivity: 4 mV
Zone Setting Status: 755011
Zone Setting Status: 755011

## 2023-09-26 ENCOUNTER — Other Ambulatory Visit: Payer: Self-pay | Admitting: Cardiology

## 2023-09-26 NOTE — Telephone Encounter (Signed)
 Prescription refill request for Xarelto  received.  Indication:afib Last office visit:2/25 Weight:112.9  kg Age:83 Scr:1.01  2/25 CrCl:90.05  ml/min  Prescription refilled

## 2023-10-21 ENCOUNTER — Other Ambulatory Visit: Payer: Self-pay | Admitting: Cardiology

## 2023-10-26 NOTE — Progress Notes (Signed)
 Remote pacemaker transmission.

## 2023-10-26 NOTE — Addendum Note (Signed)
 Addended by: Edra Govern D on: 10/26/2023 02:33 PM   Modules accepted: Orders

## 2023-12-05 ENCOUNTER — Ambulatory Visit

## 2023-12-05 ENCOUNTER — Ambulatory Visit: Payer: Medicare Other

## 2023-12-05 DIAGNOSIS — I442 Atrioventricular block, complete: Secondary | ICD-10-CM

## 2023-12-06 LAB — CUP PACEART REMOTE DEVICE CHECK
Battery Impedance: 3447 Ohm
Battery Remaining Longevity: 17 mo
Battery Voltage: 2.7 V
Brady Statistic AP VP Percent: 61 %
Brady Statistic AP VS Percent: 0 %
Brady Statistic AS VP Percent: 39 %
Brady Statistic AS VS Percent: 0 %
Date Time Interrogation Session: 20250714093734
Implantable Lead Connection Status: 753985
Implantable Lead Connection Status: 753985
Implantable Lead Implant Date: 20131113
Implantable Lead Implant Date: 20131113
Implantable Lead Location: 753859
Implantable Lead Location: 753860
Implantable Lead Model: 5076
Implantable Lead Model: 5092
Implantable Pulse Generator Implant Date: 20131113
Lead Channel Impedance Value: 401 Ohm
Lead Channel Impedance Value: 735 Ohm
Lead Channel Pacing Threshold Amplitude: 0.5 V
Lead Channel Pacing Threshold Amplitude: 0.625 V
Lead Channel Pacing Threshold Pulse Width: 0.4 ms
Lead Channel Pacing Threshold Pulse Width: 0.4 ms
Lead Channel Setting Pacing Amplitude: 2 V
Lead Channel Setting Pacing Amplitude: 2.5 V
Lead Channel Setting Pacing Pulse Width: 0.4 ms
Lead Channel Setting Sensing Sensitivity: 4 mV
Zone Setting Status: 755011
Zone Setting Status: 755011

## 2023-12-07 ENCOUNTER — Ambulatory Visit: Payer: Self-pay | Admitting: Cardiology

## 2023-12-07 ENCOUNTER — Telehealth: Payer: Self-pay

## 2023-12-07 NOTE — Telephone Encounter (Signed)
 Counters show >99% RV pacing and battery estimated 17 months with minimum of <1 month in range  Spoke to patient's wife and made her aware of IFU schedule (okay per DPR) and reason for monthly monitoring and range.

## 2023-12-21 DIAGNOSIS — K219 Gastro-esophageal reflux disease without esophagitis: Secondary | ICD-10-CM | POA: Insufficient documentation

## 2023-12-21 DIAGNOSIS — H3530 Unspecified macular degeneration: Secondary | ICD-10-CM | POA: Insufficient documentation

## 2023-12-21 DIAGNOSIS — Z9889 Other specified postprocedural states: Secondary | ICD-10-CM | POA: Insufficient documentation

## 2023-12-22 ENCOUNTER — Encounter: Payer: Self-pay | Admitting: Cardiology

## 2023-12-22 ENCOUNTER — Ambulatory Visit: Attending: Cardiology | Admitting: Cardiology

## 2023-12-22 VITALS — BP 124/68 | HR 74 | Ht 69.5 in | Wt 245.6 lb

## 2023-12-22 DIAGNOSIS — I2510 Atherosclerotic heart disease of native coronary artery without angina pectoris: Secondary | ICD-10-CM | POA: Diagnosis present

## 2023-12-22 DIAGNOSIS — I350 Nonrheumatic aortic (valve) stenosis: Secondary | ICD-10-CM | POA: Insufficient documentation

## 2023-12-22 DIAGNOSIS — Z950 Presence of cardiac pacemaker: Secondary | ICD-10-CM | POA: Diagnosis present

## 2023-12-22 DIAGNOSIS — Z952 Presence of prosthetic heart valve: Secondary | ICD-10-CM | POA: Diagnosis present

## 2023-12-22 DIAGNOSIS — I480 Paroxysmal atrial fibrillation: Secondary | ICD-10-CM | POA: Insufficient documentation

## 2023-12-22 NOTE — Patient Instructions (Signed)

## 2023-12-22 NOTE — Progress Notes (Signed)
 Cardiology Office Note:    Date:  12/22/2023   ID:  Frederick Vasquez, DOB 25-Nov-1940, MRN [id number]  PCP:  [name], MD  Cardiologist:  [name], MD    Referring MD: [name], MD   Chief Complaint  Patient presents with   Blood Pressure Check    History of Present Illness:    Frederick Vasquez is a 83 y.o. male past medical history significant for severe aortic stenosis status post TAVI done in 2019 with 26 mm Edwards SAPIEN S3 valve, paroxysmal atrial fibrillation successfully suppressed with Tikosyn , anticoagulated, essential hypertension, dyslipidemia.  Comes today to my office for follow-up overall doing well denies have any chest pain tightness squeezing pressure burning chest.  He was recently diagnosed with [facility]best[facility] that [facility].  Short of breath is still there  Past Medical History:  Diagnosis Date   Acute prostatitis 06/06/2020   Formatting of this note might be different from the original. 06/06/2020   Aortic stenosis 05/15/2017   Severe by echo from October 2019  Formatting of this note might be different from the original. Formatting of this note might be different from the original. 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 Chest CT 01/18/2019 2019: [employer] exposure 2019: CXR: IMPRESSION: 1. 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 2. No active infiltrate or effusion. 2019: PULM eval   Atrial fibrillation (HCC)    Xarelto    Balanitis 04/10/2020   Formatting of this note might be different from the original. 04/10/2020   Chronic anticoagulation 06/14/2017   CHADS VASC=4   Chronic diastolic CHF (congestive heart failure) (HCC) 05/15/2017   Contact with and (suspected) exposure to asbestos 11/17/2017   Formatting of this note might be different from the  original. 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 2021   Diastolic heart failure (HCC)    Dyslipidemia 11/19/2014   Dysrhythmia    A- Fib   Elevated troponin 05/15/2017   Encounter for administration of vaccine 11/19/2014   Essential hypertension 05/15/2017   Exacerbation of chronic bronchiolitis (HCC) 02/28/2020   Formatting of this note might be different from the original. 02/28/2020   Gastrointestinal hemorrhage 09/25/2019   Formatting of this note might be different from the original. 2021   Hemoptysis 05/15/2017   Formatting of this note might be different from the original. 2019   History of aortic valvular stenosis 02/26/2016   Last Assessment & Plan:  Formatting of this note might be different from the original. Moderate.   History of gout 08/27/2015   Hyperlipidemia    Hypertension    Hypothyroidism 09/13/2019   Formatting of this note might be different from the original. 2021: TSH 41   Impaired glucose tolerance    Lung nodule 01/25/2019   Formatting of this note is different from the original. Chest CT 01/18/2019 notes a stable 4 mm right middle lobe lung nodule- recheck chest CT August 2021   Lupus    Normal coronary arteries 06/14/2017   2014 cath in [facility]   Pacemaker    Paroxysmal atrial fibrillation (HCC) 11/19/2014   Xarelto  Formatting of this note  might be different from the original. Managed CARDS  Formatting of this note might be different from the original. 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 05/2020: ORTHO, myelogram, surg rec   Suspected COVID-19 virus infection 09/25/2019   Formatting of this note might be different from the original. 2021   Thrombocytopenia (HCC) 05/15/2017   Type 2 diabetes mellitus without complication, without long-term current use of insulin  (HCC) 08/27/2015   Formatting of this note might be different from the original. 2019: 99/5.3 2020: 125/5.7 2021: 126/6.6    Past Surgical History:  Procedure Laterality Date   CARDIAC CATHETERIZATION  2014   non-obs dz, done at [facility]   CARDIOVERSION N/A 09/02/2017   Procedure: CARDIOVERSION;  Surgeon: Pietro Redell RAMAN, MD;  Location: [facility] ENDOSCOPY;  Service: Cardiovascular;  Laterality: N/A;   CARDIOVERSION N/A 10/29/2021   Procedure: CARDIOVERSION;  Surgeon: Hobart Powell BRAVO, MD;  Location: [facility] ENDOSCOPY;  Service: Cardiovascular;  Laterality: N/A;   COLONOSCOPY     EYE SURGERY     bilateral cataracts   PACEMAKER IMPLANT  03/2012   REPLACEMENT TOTAL KNEE Left    RIGHT/LEFT HEART CATH AND CORONARY ANGIOGRAPHY N/A 03/28/2018   Procedure: RIGHT/LEFT HEART CATH AND CORONARY ANGIOGRAPHY;  Surgeon: Claudene Victory ORN, MD;  Location: [facility] INVASIVE CV LAB;  Service: Cardiovascular;  Laterality: N/A;   TEE WITHOUT CARDIOVERSION N/A 05/12/2018   Procedure: TRANSESOPHAGEAL ECHOCARDIOGRAM (TEE);  Surgeon: Wonda Sharper, MD;  Location: [facility] OR;  Service: Open Heart Surgery;  Laterality: N/A;   TRANSCATHETER AORTIC VALVE REPLACEMENT, TRANSFEMORAL N/A 05/12/2018   Procedure: TRANSCATHETER AORTIC VALVE REPLACEMENT, TRANSFEMORAL;  Surgeon: Wonda Sharper, MD;   Location: [facility] OR;  Service: Open Heart Surgery;  Laterality: N/A;   VASECTOMY      Current Medications: Current Meds  Medication Sig   acetaminophen  (TYLENOL ) 500 MG tablet Take 1,000 mg by mouth every 6 (six) hours as needed for moderate pain or headache.   amLODipine  (NORVASC ) 2.5 MG tablet Take 1 tablet (2.5 mg total) by mouth daily.   dofetilide  (TIKOSYN ) 500 MCG capsule TAKE 1 CAPSULE(500 MCG) BY MOUTH TWICE DAILY   levothyroxine  (SYNTHROID ) 88 MCG tablet Take 88 mcg by mouth daily before breakfast.   metoprolol  tartrate (LOPRESSOR ) 50 MG tablet Take 1.5 tablets (75 mg total) by mouth 2 (two) times daily.   Multiple Vitamins-Minerals (PRESERVISION AREDS 2+MULTI VIT) CAPS Take 2 capsules by mouth every morning.   nitroGLYCERIN  (NITROSTAT ) 0.4 MG SL tablet Place 0.4 mg under the tongue every 5 (five) minutes as needed for chest pain.   pantoprazole  (PROTONIX ) 40 MG tablet Take 1 tablet by mouth daily.   potassium chloride  SA (KLOR-CON  M) 20 MEQ tablet TAKE 1 TABLET(20 MEQ) BY MOUTH DAILY   rosuvastatin  (CRESTOR ) 40 MG tablet Take 40 mg by mouth daily.   XARELTO  20 MG TABS  tablet TAKE 1 TABLET(20 MG) BY MOUTH DAILY WITH SUPPER     Allergies:   Sulfamethoxazole-trimethoprim, Allopurinol, and Atorvastatin   Social History   Socioeconomic History   Marital status: Married    Spouse name: Not on file   Number of children: Not on file   Years of education: Not on file   Highest education level: Not on file  Occupational History   Occupation: retired  Tobacco Use   Smoking status: Former    Current packs/day: 0.00    Types: Cigarettes    Quit date: 1978    Years since quitting: 47.6   Smokeless tobacco: Never   Tobacco comments:    Former smoker 10/27/21  Vaping Use   Vaping status: Never Used  Substance and Sexual Activity   Alcohol use: No   Drug use: No   Sexual activity: Not on file  Other Topics Concern   Not on file  Social History Narrative   Pt lives in [place]    Social Drivers of Health   Financial Resource Strain: Medium Risk (04/18/2019)   Received from [facility] visits prior to 07/24/2022.   Overall Financial Resource Strain (CARDIA)    Difficulty of Paying Living Expenses: Somewhat hard  Food Insecurity: Low Risk  (06/28/2023)   [facility]   Hunger Vital Sign    Within the past 12 months, you worried that your food would run out before you got money to buy more: Never true    Within the past 12 months, the food you bought just didn't last and you didn't have money to get more. : Never true  Transportation Needs: No Transportation Needs (06/28/2023)   Received from [employer]    In the past 12 months, has lack of reliable transportation kept you from medical appointments, meetings, work or from getting things needed for daily living? : No  Physical Activity: Inactive (04/18/2019)   Received from [facility] visits prior to 07/24/2022.   Exercise Vital Sign    Days of Exercise per Week: 0 days    Minutes of Exercise per Session: 0 min  Stress: No Stress Concern Present (04/18/2019)   Received from [facility] visits prior to 07/24/2022.   [employer] of Occupational Health - Occupational Stress Questionnaire    Feeling of Stress : Not at all  Social Connections: Socially Integrated (04/18/2019)   [facility] visits prior to 07/24/2022.   Social Advertising account executive    Frequency of Communication with Friends and Family: More than three times a week    Frequency of Social Gatherings with Friends and Family: Patient refused    Attends Religious Services: More than 4 times per year    Active Member of [employer] or Organizations: Yes    Attends Banker Meetings: Patient refused    Marital Status: Married     Family History: The patient's family history includes CAD in his father; Colon  cancer in his sister; Stroke in his father. ROS:   Please see the history of present illness.    All 14 point review of systems negative except as described per history of present illness  EKGs/Labs/Other Studies Reviewed:         Recent Labs: 01/18/2023: Magnesium  1.9 07/18/2023: BUN 17; Creatinine, Ser 1.01; Hemoglobin 11.3; Platelets 137; Potassium 4.2; Sodium 140  Recent Lipid Panel No results found for: CHOL,  TRIG, HDL, CHOLHDL, VLDL, LDLCALC, LDLDIRECT  Physical Exam:    VS:  BP 124/68 (BP Location: Left Arm, Patient Position: Sitting)   Pulse 74   Ht 5' 9.5 (1.765 m)   Wt 245 lb 9.6 oz (111.4 kg)   SpO2 95%   BMI 35.75 kg/m     Wt Readings from Last 3 Encounters:  12/22/23 245 lb 9.6 oz (111.4 kg)  07/18/23 249 lb (112.9 kg)  06/22/23 249 lb (112.9 kg)     GEN:  Well nourished, well developed in no acute distress HEENT: Normal NECK: No JVD; No carotid bruits LYMPHATICS: No lymphadenopathy CARDIAC: RRR, no murmurs, no rubs, no gallops RESPIRATORY:  Clear to auscultation without rales, wheezing or rhonchi  ABDOMEN: Soft, non-tender, non-distended MUSCULOSKELETAL:  No edema; No deformity  SKIN: Warm and dry LOWER EXTREMITIES: no swelling NEUROLOGIC:  Alert and oriented x 3 PSYCHIATRIC:  Normal affect   ASSESSMENT:    1. Coronary artery disease involving native coronary artery of native heart without angina pectoris   2. Paroxysmal atrial fibrillation (HCC)   3. Nonrheumatic aortic valve stenosis   4. S/P TAVR (transcatheter aortic valve replacement)   5. Pacemaker    PLAN:    In order of problems listed above:  Coronary disease seems to be stable from that point review on guideline directed medical therapy which I will continue. Paroxysmal atrial fibrillation rare episodes, anticoagulated continue with Tikosyn . Nonrheumatic aortic valve stenosis status post TAVI.  Valve functioning properly based on last echocardiogram. Pacemaker present I  did review interrogation of the device.  Between 1 and 17 months (the battery.  He does have battery being checked every month.   Medication Adjustments/Labs and Tests Ordered: Current medicines are reviewed at length with the patient today.  Concerns regarding medicines are outlined above.  No orders of the defined types were placed in this encounter.  Medication changes: No orders of the defined types were placed in this encounter.   Signed, Lamar DOROTHA Fitch, MD, [facility] 12/22/2023 11:25 AM    [facility]

## 2024-01-06 ENCOUNTER — Ambulatory Visit

## 2024-01-06 DIAGNOSIS — I442 Atrioventricular block, complete: Secondary | ICD-10-CM

## 2024-01-10 LAB — CUP PACEART REMOTE DEVICE CHECK
Battery Impedance: 3619 Ohm
Battery Remaining Longevity: 15 mo
Battery Voltage: 2.7 V
Brady Statistic AP VP Percent: 61 %
Brady Statistic AP VS Percent: 0 %
Brady Statistic AS VP Percent: 39 %
Brady Statistic AS VS Percent: 0 %
Date Time Interrogation Session: 20250815094141
Implantable Lead Connection Status: 753985
Implantable Lead Connection Status: 753985
Implantable Lead Implant Date: 20131113
Implantable Lead Implant Date: 20131113
Implantable Lead Location: 753859
Implantable Lead Location: 753860
Implantable Lead Model: 5076
Implantable Lead Model: 5092
Implantable Pulse Generator Implant Date: 20131113
Lead Channel Impedance Value: 388 Ohm
Lead Channel Impedance Value: 733 Ohm
Lead Channel Pacing Threshold Amplitude: 0.5 V
Lead Channel Pacing Threshold Amplitude: 0.625 V
Lead Channel Pacing Threshold Pulse Width: 0.4 ms
Lead Channel Pacing Threshold Pulse Width: 0.4 ms
Lead Channel Setting Pacing Amplitude: 2 V
Lead Channel Setting Pacing Amplitude: 2.5 V
Lead Channel Setting Pacing Pulse Width: 0.4 ms
Lead Channel Setting Sensing Sensitivity: 4 mV
Zone Setting Status: 755011
Zone Setting Status: 755011

## 2024-02-06 ENCOUNTER — Ambulatory Visit

## 2024-02-13 LAB — CUP PACEART REMOTE DEVICE CHECK
Battery Impedance: 3677 Ohm
Battery Remaining Longevity: 15 mo
Battery Voltage: 2.67 V
Brady Statistic AP VP Percent: 62 %
Brady Statistic AP VS Percent: 0 %
Brady Statistic AS VP Percent: 38 %
Brady Statistic AS VS Percent: 0 %
Date Time Interrogation Session: 20250919124707
Implantable Lead Connection Status: 753985
Implantable Lead Connection Status: 753985
Implantable Lead Implant Date: 20131113
Implantable Lead Implant Date: 20131113
Implantable Lead Location: 753859
Implantable Lead Location: 753860
Implantable Lead Model: 5076
Implantable Lead Model: 5092
Implantable Pulse Generator Implant Date: 20131113
Lead Channel Impedance Value: 410 Ohm
Lead Channel Impedance Value: 710 Ohm
Lead Channel Pacing Threshold Amplitude: 0.5 V
Lead Channel Pacing Threshold Amplitude: 0.625 V
Lead Channel Pacing Threshold Pulse Width: 0.4 ms
Lead Channel Pacing Threshold Pulse Width: 0.4 ms
Lead Channel Setting Pacing Amplitude: 2 V
Lead Channel Setting Pacing Amplitude: 2.5 V
Lead Channel Setting Pacing Pulse Width: 0.4 ms
Lead Channel Setting Sensing Sensitivity: 4 mV
Zone Setting Status: 755011
Zone Setting Status: 755011

## 2024-03-01 NOTE — Progress Notes (Signed)
 Remote PPM Transmission

## 2024-03-05 ENCOUNTER — Ambulatory Visit

## 2024-03-05 DIAGNOSIS — I480 Paroxysmal atrial fibrillation: Secondary | ICD-10-CM

## 2024-03-06 DIAGNOSIS — B9689 Other specified bacterial agents as the cause of diseases classified elsewhere: Secondary | ICD-10-CM | POA: Insufficient documentation

## 2024-03-06 LAB — CUP PACEART REMOTE DEVICE CHECK
Battery Impedance: 3544 Ohm
Battery Remaining Longevity: 15 mo
Battery Voltage: 2.69 V
Brady Statistic AP VP Percent: 62 %
Brady Statistic AP VS Percent: 0 %
Brady Statistic AS VP Percent: 38 %
Brady Statistic AS VS Percent: 0 %
Date Time Interrogation Session: 20251014093820
Implantable Lead Connection Status: 753985
Implantable Lead Connection Status: 753985
Implantable Lead Implant Date: 20131113
Implantable Lead Implant Date: 20131113
Implantable Lead Location: 753859
Implantable Lead Location: 753860
Implantable Lead Model: 5076
Implantable Lead Model: 5092
Implantable Pulse Generator Implant Date: 20131113
Lead Channel Impedance Value: 361 Ohm
Lead Channel Impedance Value: 654 Ohm
Lead Channel Pacing Threshold Amplitude: 0.5 V
Lead Channel Pacing Threshold Amplitude: 0.625 V
Lead Channel Pacing Threshold Pulse Width: 0.4 ms
Lead Channel Pacing Threshold Pulse Width: 0.4 ms
Lead Channel Setting Pacing Amplitude: 2 V
Lead Channel Setting Pacing Amplitude: 2.5 V
Lead Channel Setting Pacing Pulse Width: 0.4 ms
Lead Channel Setting Sensing Sensitivity: 4 mV
Zone Setting Status: 755011
Zone Setting Status: 755011

## 2024-03-07 NOTE — Progress Notes (Signed)
 Remote PPM Transmission

## 2024-03-08 ENCOUNTER — Encounter

## 2024-03-17 ENCOUNTER — Other Ambulatory Visit: Payer: Self-pay | Admitting: Cardiology

## 2024-03-20 ENCOUNTER — Ambulatory Visit: Payer: Self-pay | Admitting: Cardiology

## 2024-03-21 NOTE — Addendum Note (Signed)
 Addended by: VICCI SELLER A on: 03/21/2024 02:36 PM   Modules accepted: Orders, Level of Service

## 2024-03-21 NOTE — Progress Notes (Signed)
 Remote pacemaker transmission.

## 2024-03-28 ENCOUNTER — Other Ambulatory Visit: Payer: Self-pay | Admitting: Cardiology

## 2024-03-28 NOTE — Telephone Encounter (Signed)
 Prescription refill request for Xarelto  received.  Indication:afib Last office visit:7/25 Weight:111.4  kg Age:83 Scr:0.87  8/25 CrCl:103.15  ml/min  Prescription refilled

## 2024-04-05 ENCOUNTER — Ambulatory Visit: Attending: Cardiology

## 2024-04-05 ENCOUNTER — Encounter

## 2024-04-06 LAB — CUP PACEART REMOTE DEVICE CHECK
Battery Impedance: 3972 Ohm
Battery Remaining Longevity: 12 mo
Battery Voltage: 2.69 V
Brady Statistic AP VP Percent: 62 %
Brady Statistic AP VS Percent: 0 %
Brady Statistic AS VP Percent: 38 %
Brady Statistic AS VS Percent: 0 %
Date Time Interrogation Session: 20251113081447
Implantable Lead Connection Status: 753985
Implantable Lead Connection Status: 753985
Implantable Lead Implant Date: 20131113
Implantable Lead Implant Date: 20131113
Implantable Lead Location: 753859
Implantable Lead Location: 753860
Implantable Lead Model: 5076
Implantable Lead Model: 5092
Implantable Pulse Generator Implant Date: 20131113
Lead Channel Impedance Value: 349 Ohm
Lead Channel Impedance Value: 675 Ohm
Lead Channel Pacing Threshold Amplitude: 0.5 V
Lead Channel Pacing Threshold Amplitude: 0.625 V
Lead Channel Pacing Threshold Pulse Width: 0.4 ms
Lead Channel Pacing Threshold Pulse Width: 0.4 ms
Lead Channel Setting Pacing Amplitude: 2 V
Lead Channel Setting Pacing Amplitude: 2.5 V
Lead Channel Setting Pacing Pulse Width: 0.4 ms
Lead Channel Setting Sensing Sensitivity: 4 mV
Zone Setting Status: 755011
Zone Setting Status: 755011

## 2024-04-09 ENCOUNTER — Encounter

## 2024-04-26 ENCOUNTER — Encounter: Payer: Self-pay | Admitting: *Deleted

## 2024-04-26 DIAGNOSIS — H353130 Nonexudative age-related macular degeneration, bilateral, stage unspecified: Secondary | ICD-10-CM | POA: Insufficient documentation

## 2024-04-26 DIAGNOSIS — Z961 Presence of intraocular lens: Secondary | ICD-10-CM | POA: Insufficient documentation

## 2024-05-01 ENCOUNTER — Telehealth: Payer: Self-pay | Admitting: *Deleted

## 2024-05-01 MED ORDER — AMLODIPINE BESYLATE 2.5 MG PO TABS: 2.5000 mg | ORAL_TABLET | Freq: Every day | ORAL | 1 refills | Status: AC

## 2024-05-01 NOTE — Telephone Encounter (Signed)
 Rx refill sent to pharmacy.

## 2024-05-06 ENCOUNTER — Ambulatory Visit

## 2024-05-07 ENCOUNTER — Encounter

## 2024-05-08 LAB — CUP PACEART REMOTE DEVICE CHECK
Battery Impedance: 4552 Ohm
Battery Remaining Longevity: 9 mo
Battery Voltage: 2.67 V
Brady Statistic AP VP Percent: 63 %
Brady Statistic AP VS Percent: 0 %
Brady Statistic AS VP Percent: 37 %
Brady Statistic AS VS Percent: 0 %
Date Time Interrogation Session: 20251215080448
Implantable Lead Connection Status: 753985
Implantable Lead Connection Status: 753985
Implantable Lead Implant Date: 20131113
Implantable Lead Implant Date: 20131113
Implantable Lead Location: 753859
Implantable Lead Location: 753860
Implantable Lead Model: 5076
Implantable Lead Model: 5092
Implantable Pulse Generator Implant Date: 20131113
Lead Channel Impedance Value: 403 Ohm
Lead Channel Impedance Value: 648 Ohm
Lead Channel Pacing Threshold Amplitude: 0.5 V
Lead Channel Pacing Threshold Amplitude: 0.625 V
Lead Channel Pacing Threshold Pulse Width: 0.4 ms
Lead Channel Pacing Threshold Pulse Width: 0.4 ms
Lead Channel Setting Pacing Amplitude: 2 V
Lead Channel Setting Pacing Amplitude: 2.5 V
Lead Channel Setting Pacing Pulse Width: 0.4 ms
Lead Channel Setting Sensing Sensitivity: 4 mV
Zone Setting Status: 755011
Zone Setting Status: 755011

## 2024-05-10 ENCOUNTER — Encounter

## 2024-06-04 ENCOUNTER — Encounter

## 2024-06-06 ENCOUNTER — Ambulatory Visit: Attending: Cardiology

## 2024-06-06 DIAGNOSIS — I442 Atrioventricular block, complete: Secondary | ICD-10-CM

## 2024-06-06 LAB — CUP PACEART REMOTE DEVICE CHECK
Battery Impedance: 4920 Ohm
Battery Remaining Longevity: 7 mo
Battery Voltage: 2.66 V
Brady Statistic AP VP Percent: 62 %
Brady Statistic AP VS Percent: 0 %
Brady Statistic AS VP Percent: 38 %
Brady Statistic AS VS Percent: 0 %
Date Time Interrogation Session: 20260112080247
Implantable Lead Connection Status: 753985
Implantable Lead Connection Status: 753985
Implantable Lead Implant Date: 20131113
Implantable Lead Implant Date: 20131113
Implantable Lead Location: 753859
Implantable Lead Location: 753860
Implantable Lead Model: 5076
Implantable Lead Model: 5092
Implantable Pulse Generator Implant Date: 20131113
Lead Channel Impedance Value: 367 Ohm
Lead Channel Impedance Value: 732 Ohm
Lead Channel Pacing Threshold Amplitude: 0.5 V
Lead Channel Pacing Threshold Amplitude: 0.625 V
Lead Channel Pacing Threshold Pulse Width: 0.4 ms
Lead Channel Pacing Threshold Pulse Width: 0.4 ms
Lead Channel Setting Pacing Amplitude: 2 V
Lead Channel Setting Pacing Amplitude: 2.5 V
Lead Channel Setting Pacing Pulse Width: 0.4 ms
Lead Channel Setting Sensing Sensitivity: 4 mV
Zone Setting Status: 755011
Zone Setting Status: 755011

## 2024-06-08 ENCOUNTER — Ambulatory Visit: Payer: Self-pay | Admitting: Cardiology

## 2024-06-11 ENCOUNTER — Encounter

## 2024-06-13 NOTE — Progress Notes (Signed)
 Remote PPM Transmission

## 2024-06-21 ENCOUNTER — Encounter: Payer: Self-pay | Admitting: *Deleted

## 2024-06-21 DIAGNOSIS — H9190 Unspecified hearing loss, unspecified ear: Secondary | ICD-10-CM | POA: Insufficient documentation

## 2024-06-21 DIAGNOSIS — R7303 Prediabetes: Secondary | ICD-10-CM | POA: Insufficient documentation

## 2024-06-27 ENCOUNTER — Encounter: Payer: Self-pay | Admitting: Cardiology

## 2024-06-27 ENCOUNTER — Ambulatory Visit: Admitting: Cardiology

## 2024-06-27 VITALS — BP 118/66 | HR 72 | Ht 69.5 in | Wt 244.8 lb

## 2024-06-27 DIAGNOSIS — Z950 Presence of cardiac pacemaker: Secondary | ICD-10-CM

## 2024-06-27 DIAGNOSIS — I10 Essential (primary) hypertension: Secondary | ICD-10-CM

## 2024-06-27 DIAGNOSIS — R0609 Other forms of dyspnea: Secondary | ICD-10-CM

## 2024-06-27 DIAGNOSIS — Z952 Presence of prosthetic heart valve: Secondary | ICD-10-CM

## 2024-06-27 DIAGNOSIS — I5032 Chronic diastolic (congestive) heart failure: Secondary | ICD-10-CM

## 2024-06-27 DIAGNOSIS — E782 Mixed hyperlipidemia: Secondary | ICD-10-CM

## 2024-06-27 NOTE — Patient Instructions (Signed)

## 2024-06-27 NOTE — Progress Notes (Unsigned)
 " Cardiology Office Note:    Date:  06/27/2024   ID:  Frederick Vasquez, DOB 1941-05-06, MRN [id number]  PCP:  Frederick Frederick CROME, MD  Cardiologist:  Frederick Fitch, MD    Referring MD: Frederick Frederick CROME, MD   No chief complaint on file.   History of Present Illness:     Frederick Vasquez is a 84 y.o. male past medical history significant for severe aortic stenosis status post TAVI done in 2019 with 26 mm Edwards SAPIEN S3 valve, paroxysmal atrial fibrillation successfully suppressed with Tikosyn , anticoagulated, essential hypertension, dyslipidemia.  Comes today to months for follow-up cardiac wise doing well denies have any chest pain tightness squeezing pressure burning chest overall feeling well  Past Medical History:  Diagnosis Date   Acute prostatitis 06/06/2020   Formatting of this note might be different from the original. 06/06/2020   Aortic stenosis 05/15/2017   Severe by echo from October 2019  Formatting of this note might be different from the original. Formatting of this note might be different from the original. 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 Chest CT 01/18/2019 2019: [employer] exposure 2019: CXR: IMPRESSION: 1. 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 2. No active infiltrate or effusion. 2019: PULM eval   Atrial fibrillation (HCC)    Xarelto    Balanitis 04/10/2020   Formatting of this note might be different from the original. 04/10/2020   Chronic anticoagulation 06/14/2017   CHADS VASC=4   Chronic diastolic CHF (congestive heart failure) (HCC) 05/15/2017   Contact with and (suspected) exposure to asbestos 11/17/2017   Formatting of this note might be different from the original. 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 2021   Diastolic heart failure (HCC)    Dyslipidemia 11/19/2014   Dysrhythmia    A- Fib   Elevated troponin 05/15/2017   Encounter for administration of vaccine 11/19/2014   Essential hypertension 05/15/2017   Exacerbation of chronic bronchiolitis (HCC) 02/28/2020   Formatting of this note might be different from the original. 02/28/2020   Gastrointestinal hemorrhage 09/25/2019   Formatting of this note might be different from the original. 2021   Hemoptysis 05/15/2017   Formatting of this note might be different from the original. 2019   History of aortic valvular stenosis 02/26/2016   Last Assessment & Plan:  Formatting of this note might be different from the original. Moderate.   History of gout 08/27/2015   Hyperlipidemia    Hypertension    Hypothyroidism 09/13/2019   Formatting of this note might be different from the original. 2021: TSH 41   Impaired glucose tolerance    Lung nodule 01/25/2019   Formatting of this note is different from the original. Chest CT 01/18/2019 notes a stable 4 mm right middle lobe lung nodule- recheck chest CT August 2021   Lupus    Normal coronary arteries 06/14/2017   2014 cath in [facility]   Pacemaker    Paroxysmal atrial fibrillation (HCC) 11/19/2014   Xarelto  Formatting of this note might be different from the original. Managed CARDS  Formatting of this note might be different from the original. As detected  by pacemaker   Presence of cardiac pacemaker 11/19/2014   Restrictive lung disease 01/25/2019   Formatting of this note might be different from the original. 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 05/2020: ORTHO, myelogram, surg rec   Suspected COVID-19 virus infection 09/25/2019   Formatting of this note might be different from the original. 2021   Thrombocytopenia 05/15/2017   Type 2 diabetes mellitus without complication, without long-term current use of insulin  (HCC) 08/27/2015   Formatting of this note might be different from the original. 2019: 99/5.3 2020: 125/5.7 2021: 126/6.6    Past Surgical History:  Procedure Laterality Date   CARDIAC CATHETERIZATION  2014   non-obs dz, done at [facility]   CARDIOVERSION N/A 09/02/2017   Procedure: CARDIOVERSION;  Surgeon: Pietro Redell RAMAN, MD;  Location: [facility] ENDOSCOPY;  Service: Cardiovascular;  Laterality: N/A;   CARDIOVERSION N/A 10/29/2021   Procedure: CARDIOVERSION;  Surgeon: Hobart Powell BRAVO, MD;  Location: [facility] ENDOSCOPY;  Service: Cardiovascular;  Laterality: N/A;   COLONOSCOPY     EYE SURGERY     bilateral cataracts   PACEMAKER IMPLANT  03/2012   REPLACEMENT TOTAL KNEE Left    RIGHT/LEFT HEART CATH AND CORONARY ANGIOGRAPHY N/A 03/28/2018   Procedure: RIGHT/LEFT HEART CATH AND CORONARY ANGIOGRAPHY;  Surgeon: Claudene Victory ORN, MD;  Location: [facility] INVASIVE CV LAB;  Service: Cardiovascular;  Laterality: N/A;   TEE WITHOUT CARDIOVERSION N/A 05/12/2018   Procedure: TRANSESOPHAGEAL ECHOCARDIOGRAM (TEE);  Surgeon: Wonda Sharper, MD;  Location[facility];  Service: Open Heart Surgery;  Laterality: N/A;   TRANSCATHETER AORTIC VALVE REPLACEMENT, TRANSFEMORAL N/A 05/12/2018   Procedure: TRANSCATHETER AORTIC VALVE REPLACEMENT, TRANSFEMORAL;  Surgeon: Wonda Sharper, MD;  Location: [facility] OR;  Service: Open Heart Surgery;  Laterality: N/A;   VASECTOMY      Current Medications: Active  Medications[1]   Allergies:   Sulfamethoxazole-trimethoprim, Allopurinol, and Atorvastatin   Social History   Socioeconomic History   Marital status: Married    Spouse name: Not on file   Number of children: Not on file   Years of education: Not on file   Highest education level: Not on file  Occupational History   Occupation: retired  Tobacco Use   Smoking status: Former    Current packs/day: 0.00    Types: Cigarettes    Quit date: 1978    Years since quitting: 48.1   Smokeless tobacco: Never   Tobacco comments:    Former smoker 10/27/21  Vaping Use   Vaping status: Never Used  Substance and Sexual Activity   Alcohol use: No   Drug use: No   Sexual activity: Not on file  Other Topics Concern   Not on file  Social History Narrative   Pt lives in [place]   Social Drivers of Health   Tobacco Use: Medium Risk (06/27/2024)   Patient History    Smoking Tobacco Use: Former    Smokeless Tobacco Use: Never    Passive Exposure: Not on Actuary Strain: Not on  file  Food Insecurity: Low Risk (01/10/2024)   Received from [facility]   Epic    Within the past 12 months, you worried that your food would run out before you got money to buy more: Never true    Within the past 12 months, the food you bought just didn't last and you didn't have money to get more. : Never true  Transportation Needs: No Transportation Needs (01/10/2024)   Received from [facility]    In the past 12 months, has lack of reliable transportation kept you from medical appointments, meetings, work or from getting things needed for daily living? : No  Physical Activity: Not on file  Stress: Not on file  Social Connections: Not on file  Depression (EYV7-0): Not on file  Alcohol Screen: Not on file  Housing: Low Risk (01/10/2024)   Received from [facility]   Epic    What is your living situation today?: I have a steady place to live    Think about the place you live.  Do you have problems with any of the following? Choose all that apply:: None/None on this list  Utilities: Low Risk (01/10/2024)   Received from [facility]   Utilities    In the past 12 months has the electric, gas, oil, or water company threatened to shut off services in your home? : No  Health Literacy: Not on file     Family History: The patient's family history includes CAD in his father; Colon cancer in his sister; Stroke in his father. ROS:   Please see the history of present illness.    All 14 point review of systems negative except as described per history of present illness  EKGs/Labs/Other Studies Reviewed:    EKG Interpretation Date/Time:  Wednesday June 27 2024 15:45:56 EST Ventricular Rate:  72 PR Interval:  228 QRS Duration:  210 QT Interval:  506 QTC Calculation: 554 R Axis:   -59  Text Interpretation: Atrial-sensed ventricular-paced rhythm with prolonged AV conduction When compared with ECG of 22-Jun-2023 09:08, Vent. rate has increased BY   4 BPM Confirmed by Bernie Charleston [phone]) on 06/27/2024 3:53:15 PM    Recent Labs: 07/18/2023: BUN 17; Creatinine, Ser 1.01; Hemoglobin 11.3; Platelets 137; Potassium 4.2; Sodium 140  Recent Lipid Panel No results found for: CHOL, TRIG, HDL, CHOLHDL, VLDL, LDLCALC, LDLDIRECT  Physical Exam:    VS:  BP 118/66   Pulse 72   Ht 5' 9.5 (1.765 m)   Wt 244 lb 12.8 oz (111 kg)   SpO2 97%   BMI 35.63 kg/m     Wt Readings from Last 3 Encounters:  06/27/24 244 lb 12.8 oz (111 kg)  12/22/23 245 lb 9.6 oz (111.4 kg)  07/18/23 249 lb (112.9 kg)     GEN:  Well nourished, well developed in no acute distress HEENT: Normal NECK: No JVD; No carotid bruits LYMPHATICS: No lymphadenopathy CARDIAC: RRR, no murmurs, no rubs, no gallops RESPIRATORY:  Clear to auscultation without rales, wheezing or rhonchi  ABDOMEN: Soft, non-tender, non-distended MUSCULOSKELETAL:  No edema; No deformity  SKIN: Warm and  dry LOWER EXTREMITIES: no swelling NEUROLOGIC:  Alert and oriented x 3 PSYCHIATRIC:  Normal affect   ASSESSMENT:    1. Primary hypertension   2. Dyspnea on exertion   3. S/P TAVR (transcatheter aortic valve replacement)   4. Presence of cardiac pacemaker   5. Mixed hyperlipidemia   6. Chronic diastolic CHF (congestive heart failure) (HCC)  PLAN:    In order of problems listed above:  Essential hypertension blood pressure well-controlled continue present management. Status post aortic valve replacement, dates TAVI, will recheck echocardiogram. Pacemaker present interrogation reviewed, he got only between 1 to 7 months left in the device.  He is being checked remotely every month. Mixed dyslipidemia, noted, continue present management   Medication Adjustments/Labs and Tests Ordered: Current medicines are reviewed at length with the patient today.  Concerns regarding medicines are outlined above.  Orders Placed This Encounter  Procedures   EKG 12-Lead   ECHOCARDIOGRAM COMPLETE   Medication changes: No orders of the defined types were placed in this encounter.   Signed, Frederick DOROTHA Fitch, MD, [facility] 06/27/2024 4:37 PM    [facility]     [1]  Current Meds  Medication Sig   acetaminophen  (TYLENOL ) 500 MG tablet Take 1,000 mg by mouth every 6 (six) hours as needed for moderate pain or headache.   amLODipine  (NORVASC ) 2.5 MG tablet Take 1 tablet (2.5 mg total) by mouth daily.   dofetilide  (TIKOSYN ) 500 MCG capsule TAKE 1 CAPSULE(500 MCG) BY MOUTH TWICE DAILY   folic acid (FOLVITE) 1 MG tablet Take 1 mg by mouth daily.   levothyroxine  (SYNTHROID ) 88 MCG tablet Take 88 mcg by mouth daily before breakfast.   metoprolol  tartrate (LOPRESSOR ) 50 MG tablet TAKE 1 AND 1/2 TABLETS(75 MG) BY MOUTH TWICE DAILY   Multiple Vitamins-Minerals (PRESERVISION AREDS 2+MULTI VIT) CAPS Take 2 capsules by mouth every morning.   nitroGLYCERIN  (NITROSTAT ) 0.4 MG SL tablet  Place 0.4 mg under the tongue every 5 (five) minutes as needed for chest pain.   pantoprazole  (PROTONIX ) 40 MG tablet Take 1 tablet by mouth daily.   potassium chloride  SA (KLOR-CON  M) 20 MEQ tablet TAKE 1 TABLET(20 MEQ) BY MOUTH DAILY   rosuvastatin  (CRESTOR ) 40 MG tablet Take 40 mg by mouth daily.   XARELTO  20 MG TABS tablet TAKE 1 TABLET(20 MG) BY MOUTH DAILY WITH SUPPER   "

## 2024-07-07 ENCOUNTER — Ambulatory Visit

## 2024-07-12 ENCOUNTER — Encounter

## 2024-07-26 ENCOUNTER — Ambulatory Visit

## 2024-08-07 ENCOUNTER — Ambulatory Visit

## 2024-08-13 ENCOUNTER — Encounter

## 2024-09-03 ENCOUNTER — Encounter

## 2024-09-07 ENCOUNTER — Ambulatory Visit

## 2024-09-13 ENCOUNTER — Encounter

## 2024-12-03 ENCOUNTER — Encounter
